# Patient Record
Sex: Male | Born: 1935 | Race: White | Hispanic: No | Smoking: Never smoker
Health system: Southern US, Community
[De-identification: ages and names within clinical notes are randomized; demographics above are authoritative.]

## PROBLEM LIST (undated history)

## (undated) DIAGNOSIS — I214 Non-ST elevation (NSTEMI) myocardial infarction: Secondary | ICD-10-CM

## (undated) DIAGNOSIS — E039 Hypothyroidism, unspecified: Secondary | ICD-10-CM

## (undated) DIAGNOSIS — R011 Cardiac murmur, unspecified: Secondary | ICD-10-CM

## (undated) DIAGNOSIS — I10 Essential (primary) hypertension: Secondary | ICD-10-CM

## (undated) DIAGNOSIS — C61 Malignant neoplasm of prostate: Secondary | ICD-10-CM

## (undated) DIAGNOSIS — M1990 Unspecified osteoarthritis, unspecified site: Secondary | ICD-10-CM

## (undated) DIAGNOSIS — D469 Myelodysplastic syndrome, unspecified: Secondary | ICD-10-CM

## (undated) HISTORY — DX: Non-ST elevation (NSTEMI) myocardial infarction: I21.4

## (undated) HISTORY — DX: Myelodysplastic syndrome, unspecified: D46.9

## (undated) HISTORY — DX: Hypothyroidism, unspecified: E03.9

## (undated) HISTORY — DX: Essential (primary) hypertension: I10

## (undated) HISTORY — DX: Cardiac murmur, unspecified: R01.1

## (undated) HISTORY — DX: Unspecified osteoarthritis, unspecified site: M19.90

## (undated) HISTORY — DX: Malignant neoplasm of prostate: C61

## (undated) HISTORY — PX: APPENDECTOMY: SHX54

---

## 1997-08-26 ENCOUNTER — Other Ambulatory Visit: Admission: RE | Admit: 1997-08-26 | Discharge: 1997-08-26 | Payer: Self-pay | Admitting: Urology

## 1997-09-09 ENCOUNTER — Ambulatory Visit (HOSPITAL_COMMUNITY): Admission: RE | Admit: 1997-09-09 | Discharge: 1997-09-09 | Payer: Self-pay | Admitting: Urology

## 1997-09-15 ENCOUNTER — Encounter: Admission: RE | Admit: 1997-09-15 | Discharge: 1997-12-14 | Payer: Self-pay | Admitting: *Deleted

## 1998-02-23 ENCOUNTER — Ambulatory Visit (HOSPITAL_COMMUNITY): Admission: RE | Admit: 1998-02-23 | Discharge: 1998-02-23 | Payer: Self-pay | Admitting: *Deleted

## 1998-04-10 ENCOUNTER — Encounter: Admission: RE | Admit: 1998-04-10 | Discharge: 1998-07-09 | Payer: Self-pay | Admitting: *Deleted

## 1998-06-02 ENCOUNTER — Emergency Department (HOSPITAL_COMMUNITY): Admission: EM | Admit: 1998-06-02 | Discharge: 1998-06-02 | Payer: Self-pay | Admitting: Emergency Medicine

## 1998-06-12 ENCOUNTER — Ambulatory Visit (HOSPITAL_COMMUNITY): Admission: RE | Admit: 1998-06-12 | Discharge: 1998-06-12 | Payer: Self-pay | Admitting: *Deleted

## 1998-06-22 ENCOUNTER — Ambulatory Visit (HOSPITAL_COMMUNITY): Admission: RE | Admit: 1998-06-22 | Discharge: 1998-06-22 | Payer: Self-pay | Admitting: *Deleted

## 1998-07-07 ENCOUNTER — Ambulatory Visit (HOSPITAL_COMMUNITY): Admission: RE | Admit: 1998-07-07 | Discharge: 1998-07-07 | Payer: Self-pay | Admitting: *Deleted

## 1998-12-19 ENCOUNTER — Ambulatory Visit (HOSPITAL_COMMUNITY): Admission: RE | Admit: 1998-12-19 | Discharge: 1998-12-19 | Payer: Self-pay | Admitting: *Deleted

## 1999-01-12 ENCOUNTER — Ambulatory Visit: Admission: RE | Admit: 1999-01-12 | Discharge: 1999-01-12 | Payer: Self-pay | Admitting: Pulmonary Disease

## 1999-03-07 ENCOUNTER — Ambulatory Visit (HOSPITAL_COMMUNITY): Admission: RE | Admit: 1999-03-07 | Discharge: 1999-03-07 | Payer: Self-pay | Admitting: Pulmonary Disease

## 1999-03-07 ENCOUNTER — Encounter: Payer: Self-pay | Admitting: Pulmonary Disease

## 1999-05-29 ENCOUNTER — Encounter: Admission: RE | Admit: 1999-05-29 | Discharge: 1999-08-27 | Payer: Self-pay | Admitting: Internal Medicine

## 1999-11-06 ENCOUNTER — Ambulatory Visit (HOSPITAL_COMMUNITY): Admission: RE | Admit: 1999-11-06 | Discharge: 1999-11-06 | Payer: Self-pay | Admitting: *Deleted

## 2001-04-10 ENCOUNTER — Encounter: Payer: Self-pay | Admitting: *Deleted

## 2001-04-10 ENCOUNTER — Emergency Department (HOSPITAL_COMMUNITY): Admission: EM | Admit: 2001-04-10 | Discharge: 2001-04-10 | Payer: Self-pay | Admitting: Emergency Medicine

## 2001-10-30 ENCOUNTER — Encounter: Payer: Self-pay | Admitting: Internal Medicine

## 2001-10-30 ENCOUNTER — Ambulatory Visit (HOSPITAL_COMMUNITY): Admission: RE | Admit: 2001-10-30 | Discharge: 2001-10-30 | Payer: Self-pay | Admitting: Internal Medicine

## 2001-11-25 ENCOUNTER — Encounter: Payer: Self-pay | Admitting: Emergency Medicine

## 2001-11-25 ENCOUNTER — Emergency Department (HOSPITAL_COMMUNITY): Admission: EM | Admit: 2001-11-25 | Discharge: 2001-11-25 | Payer: Self-pay | Admitting: Emergency Medicine

## 2003-04-09 HISTORY — PX: TOTAL KNEE ARTHROPLASTY: SHX125

## 2004-04-17 ENCOUNTER — Ambulatory Visit: Payer: Self-pay | Admitting: Internal Medicine

## 2004-07-25 ENCOUNTER — Ambulatory Visit: Payer: Self-pay | Admitting: Internal Medicine

## 2005-01-03 ENCOUNTER — Ambulatory Visit: Payer: Self-pay | Admitting: Physical Medicine & Rehabilitation

## 2005-01-03 ENCOUNTER — Inpatient Hospital Stay (HOSPITAL_COMMUNITY): Admission: RE | Admit: 2005-01-03 | Discharge: 2005-01-08 | Payer: Self-pay | Admitting: Orthopaedic Surgery

## 2005-11-25 ENCOUNTER — Ambulatory Visit: Payer: Self-pay | Admitting: Hematology & Oncology

## 2005-11-25 LAB — CBC & DIFF AND RETIC
Basophils Absolute: 0 10*3/uL (ref 0.0–0.1)
Eosinophils Absolute: 0.1 10*3/uL (ref 0.0–0.5)
HCT: 36.3 % — ABNORMAL LOW (ref 38.7–49.9)
HGB: 13 g/dL (ref 13.0–17.1)
IRF: 0.31 (ref 0.070–0.380)
MONO#: 0.2 10*3/uL (ref 0.1–0.9)
NEUT#: 1.6 10*3/uL (ref 1.5–6.5)
NEUT%: 50.5 % (ref 40.0–75.0)
RDW: 13.2 % (ref 11.2–14.6)
RETIC #: 46.6 10*3/uL (ref 31.8–103.9)
lymph#: 1.3 10*3/uL (ref 0.9–3.3)

## 2005-11-25 LAB — CHCC SMEAR

## 2005-11-28 LAB — COMPREHENSIVE METABOLIC PANEL
ALT: 12 U/L (ref 0–40)
AST: 14 U/L (ref 0–37)
Albumin: 4.2 g/dL (ref 3.5–5.2)
Alkaline Phosphatase: 73 U/L (ref 39–117)
BUN: 16 mg/dL (ref 6–23)
Chloride: 104 mEq/L (ref 96–112)
Potassium: 4.2 mEq/L (ref 3.5–5.3)
Sodium: 139 mEq/L (ref 135–145)
Total Protein: 6.5 g/dL (ref 6.0–8.3)

## 2005-11-28 LAB — ERYTHROPOIETIN: Erythropoietin: 25.2 m[IU]/mL (ref 2.6–34.0)

## 2005-11-28 LAB — PROTEIN ELECTROPHORESIS, SERUM
Albumin ELP: 62.8 % (ref 55.8–66.1)
Alpha-1-Globulin: 4.2 % (ref 2.9–4.9)
Alpha-2-Globulin: 9.8 % (ref 7.1–11.8)
Beta 2: 3.8 % (ref 3.2–6.5)
Beta Globulin: 6.3 % (ref 4.7–7.2)
Total Protein, Serum Electrophoresis: 6.5 g/dL (ref 6.0–8.3)

## 2005-11-28 LAB — TRANSFERRIN RECEPTOR, SOLUABLE: Transferrin Receptor, Soluble: 2.3 mg/L (ref 2.2–5.0)

## 2006-12-08 DIAGNOSIS — D469 Myelodysplastic syndrome, unspecified: Secondary | ICD-10-CM

## 2006-12-08 HISTORY — DX: Myelodysplastic syndrome, unspecified: D46.9

## 2006-12-26 ENCOUNTER — Emergency Department (HOSPITAL_COMMUNITY): Admission: EM | Admit: 2006-12-26 | Discharge: 2006-12-26 | Payer: Self-pay | Admitting: Emergency Medicine

## 2006-12-30 ENCOUNTER — Ambulatory Visit: Payer: Self-pay | Admitting: Hematology & Oncology

## 2006-12-30 ENCOUNTER — Inpatient Hospital Stay (HOSPITAL_COMMUNITY): Admission: AD | Admit: 2006-12-30 | Discharge: 2007-01-02 | Payer: Self-pay | Admitting: Urology

## 2007-01-01 ENCOUNTER — Encounter: Payer: Self-pay | Admitting: Hematology & Oncology

## 2007-01-16 ENCOUNTER — Ambulatory Visit: Payer: Self-pay | Admitting: Hematology & Oncology

## 2007-01-20 LAB — CBC WITH DIFFERENTIAL/PLATELET
BASO%: 0.4 % (ref 0.0–2.0)
Basophils Absolute: 0 10*3/uL (ref 0.0–0.1)
EOS%: 0 % (ref 0.0–7.0)
HCT: 33.3 % — ABNORMAL LOW (ref 38.7–49.9)
HGB: 11.9 g/dL — ABNORMAL LOW (ref 13.0–17.1)
MCH: 31.2 pg (ref 28.0–33.4)
MONO#: 0.3 10*3/uL (ref 0.1–0.9)
NEUT#: 1.2 10*3/uL — ABNORMAL LOW (ref 1.5–6.5)
RDW: 13.9 % (ref 11.2–14.6)
WBC: 2.5 10*3/uL — ABNORMAL LOW (ref 4.0–10.0)
lymph#: 1.1 10*3/uL (ref 0.9–3.3)

## 2007-01-20 LAB — CHCC SMEAR

## 2007-02-18 LAB — CBC & DIFF AND RETIC
Eosinophils Absolute: 0.1 10*3/uL (ref 0.0–0.5)
HCT: 33.8 % — ABNORMAL LOW (ref 38.7–49.9)
HGB: 12.2 g/dL — ABNORMAL LOW (ref 13.0–17.1)
IRF: 0.35 (ref 0.070–0.380)
LYMPH%: 36.2 % (ref 14.0–48.0)
MONO#: 0.2 10*3/uL (ref 0.1–0.9)
NEUT#: 1.5 10*3/uL (ref 1.5–6.5)
NEUT%: 51.9 % (ref 40.0–75.0)
Platelets: ADEQUATE 10*3/uL (ref 145–400)
WBC: 2.9 10*3/uL — ABNORMAL LOW (ref 4.0–10.0)
lymph#: 1 10*3/uL (ref 0.9–3.3)

## 2007-02-18 LAB — FERRITIN: Ferritin: 30 ng/mL (ref 22–322)

## 2007-03-31 ENCOUNTER — Ambulatory Visit: Payer: Self-pay | Admitting: Hematology & Oncology

## 2007-04-06 LAB — MANUAL DIFFERENTIAL
ANC (CHCC manual diff): 1.8 10*3/uL (ref 1.5–6.5)
Blasts: 0 % (ref 0–0)
EOS: 3 % (ref 0–7)
Myelocytes: 0 % (ref 0–0)
Other Cell: 0 % (ref 0–0)
PROMYELO: 0 % (ref 0–0)
SEG: 53 % (ref 38–77)

## 2007-04-06 LAB — CBC WITH DIFFERENTIAL/PLATELET
HCT: 36.8 % — ABNORMAL LOW (ref 38.7–49.9)
HGB: 13.1 g/dL (ref 13.0–17.1)
MCH: 30.5 pg (ref 28.0–33.4)
RDW: 14 % (ref 11.2–14.6)
WBC: 3.3 10*3/uL — ABNORMAL LOW (ref 4.0–10.0)

## 2007-04-06 LAB — CHCC SMEAR

## 2007-07-29 ENCOUNTER — Ambulatory Visit: Payer: Self-pay | Admitting: Hematology & Oncology

## 2007-08-03 LAB — CBC & DIFF AND RETIC
BASO%: 0.7 % (ref 0.0–2.0)
Basophils Absolute: 0 10*3/uL (ref 0.0–0.1)
HCT: 32.1 % — ABNORMAL LOW (ref 38.7–49.9)
HGB: 11.6 g/dL — ABNORMAL LOW (ref 13.0–17.1)
IRF: 0.34 (ref 0.070–0.380)
LYMPH%: 29.2 % (ref 14.0–48.0)
MCH: 30.8 pg (ref 28.0–33.4)
MCHC: 36 g/dL — ABNORMAL HIGH (ref 32.0–35.9)
MONO#: 0.3 10*3/uL (ref 0.1–0.9)
NEUT%: 59.1 % (ref 40.0–75.0)
Platelets: ADEQUATE 10*3/uL (ref 145–400)
lymph#: 1.2 10*3/uL (ref 0.9–3.3)

## 2007-08-03 LAB — CHCC SMEAR

## 2007-08-03 LAB — FERRITIN: Ferritin: 25 ng/mL (ref 22–322)

## 2007-09-12 ENCOUNTER — Encounter: Admission: RE | Admit: 2007-09-12 | Discharge: 2007-09-12 | Payer: Self-pay | Admitting: Orthopaedic Surgery

## 2008-02-05 ENCOUNTER — Ambulatory Visit: Payer: Self-pay | Admitting: Hematology & Oncology

## 2008-03-14 ENCOUNTER — Ambulatory Visit: Payer: Self-pay | Admitting: Oncology

## 2008-03-14 LAB — CBC & DIFF AND RETIC
Basophils Absolute: 0 10*3/uL (ref 0.0–0.1)
EOS%: 3.2 % (ref 0.0–7.0)
Eosinophils Absolute: 0.1 10*3/uL (ref 0.0–0.5)
HCT: 31.7 % — ABNORMAL LOW (ref 38.7–49.9)
HGB: 11.4 g/dL — ABNORMAL LOW (ref 13.0–17.1)
LYMPH%: 33.6 % (ref 14.0–48.0)
MCH: 30.2 pg (ref 28.0–33.4)
MCV: 83.8 fL (ref 81.6–98.0)
MONO%: 7.6 % (ref 0.0–13.0)
NEUT#: 2 10*3/uL (ref 1.5–6.5)
NEUT%: 54.5 % (ref 40.0–75.0)
Platelets: ADEQUATE 10*3/uL (ref 145–400)
RETIC #: 85.3 10*3/uL (ref 31.8–103.9)

## 2008-03-14 LAB — FERRITIN: Ferritin: 15 ng/mL — ABNORMAL LOW (ref 22–322)

## 2008-05-20 ENCOUNTER — Encounter: Admission: RE | Admit: 2008-05-20 | Discharge: 2008-05-20 | Payer: Self-pay | Admitting: Internal Medicine

## 2008-09-12 ENCOUNTER — Ambulatory Visit: Payer: Self-pay | Admitting: Oncology

## 2008-09-14 LAB — COMPREHENSIVE METABOLIC PANEL
AST: 13 U/L (ref 0–37)
Albumin: 4.1 g/dL (ref 3.5–5.2)
Alkaline Phosphatase: 68 U/L (ref 39–117)
BUN: 14 mg/dL (ref 6–23)
Creatinine, Ser: 0.92 mg/dL (ref 0.40–1.50)
Glucose, Bld: 101 mg/dL — ABNORMAL HIGH (ref 70–99)
Potassium: 4.7 mEq/L (ref 3.5–5.3)
Total Bilirubin: 0.3 mg/dL (ref 0.3–1.2)

## 2008-09-14 LAB — CBC WITH DIFFERENTIAL/PLATELET
Basophils Absolute: 0 10*3/uL (ref 0.0–0.1)
EOS%: 2.1 % (ref 0.0–7.0)
HCT: 33.2 % — ABNORMAL LOW (ref 38.4–49.9)
HGB: 11.6 g/dL — ABNORMAL LOW (ref 13.0–17.1)
MCH: 29.7 pg (ref 27.2–33.4)
MONO#: 0.3 10*3/uL (ref 0.1–0.9)
NEUT#: 1.3 10*3/uL — ABNORMAL LOW (ref 1.5–6.5)
RDW: 13.4 % (ref 11.0–14.6)
WBC: 3.4 10*3/uL — ABNORMAL LOW (ref 4.0–10.3)
lymph#: 1.7 10*3/uL (ref 0.9–3.3)

## 2008-09-14 LAB — CHCC SMEAR

## 2008-12-07 IMAGING — CT CT ABDOMEN W/ CM
1 of 4 series · 13 of 32 positions shown, 19 images · IV contrast (omnipaque)
Comparison: none

CLINICAL DATA: Constipation, abdominal pain, prostate CA.
 ABDOMEN CT WITH CONTRAST ? 12/26/06:
TECHNIQUE: Multidetector CT imaging of the abdomen was performed following the standard protocol during bolus administration of intravenous contrast. 
 Contrast:  100 cc Omnipaque 300 IV and oral Gastrografin.
TECHNIQUE: Multidetector CT imaging of the pelvis was performed following the standard protocol during bolus administration of intravenous contrast.

[Series 2: abd_pel 5.0 b40f st · axial · 0.76mm/px · z∈[-401,+14]mm · 13 of 97 slices shown, 19 images]
[im 7/97  soft-tissue]
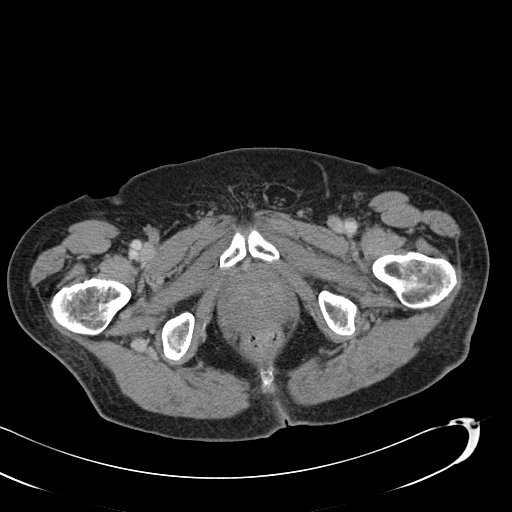
[im 7/97  bone]
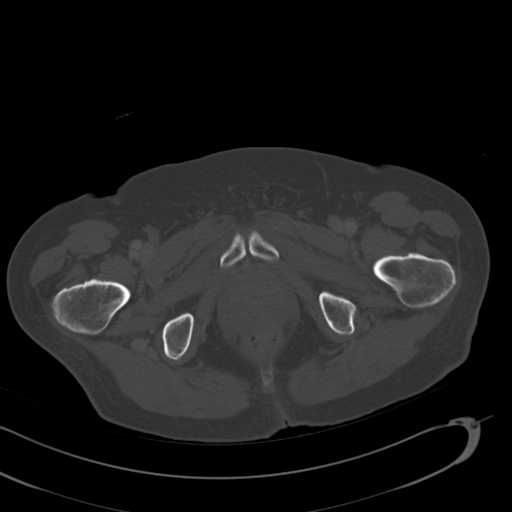
[im 13/97  soft-tissue]
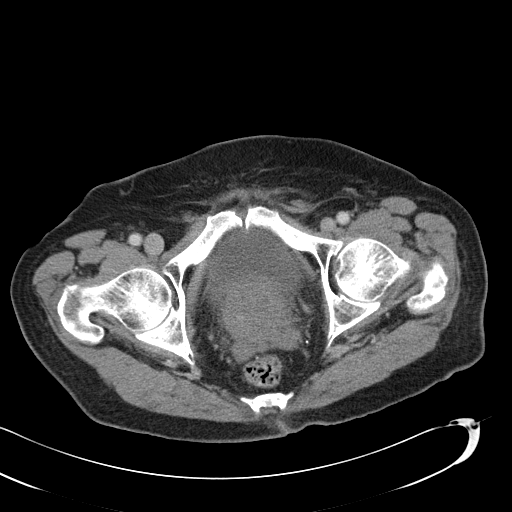
[im 20/97  soft-tissue]
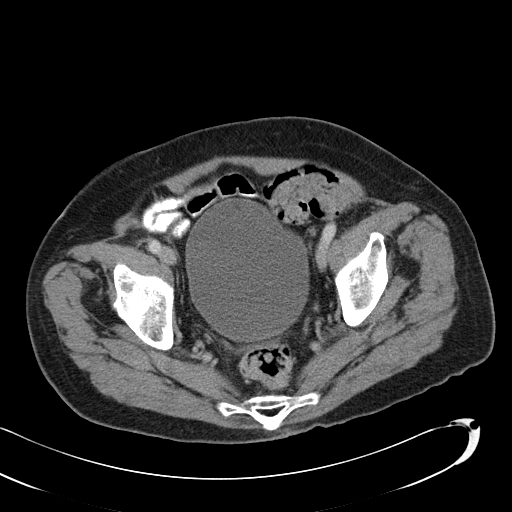
[im 26/97  soft-tissue]
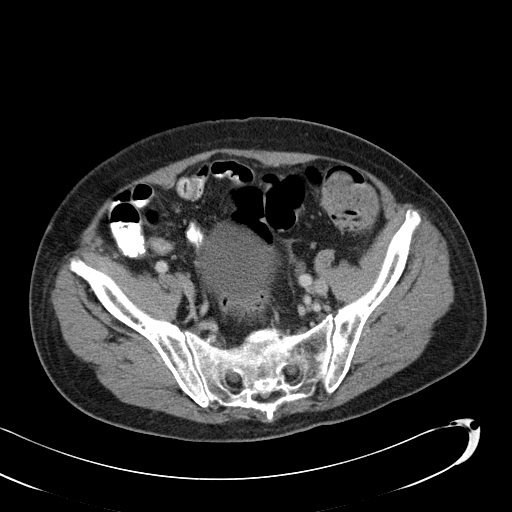
[im 33/97  soft-tissue]
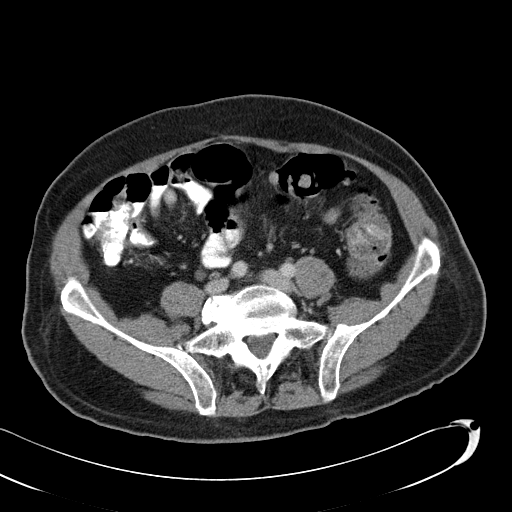
[im 39/97  soft-tissue]
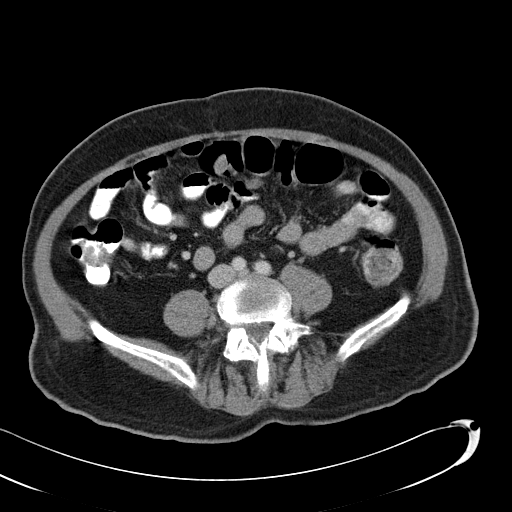
[im 52/97  soft-tissue]
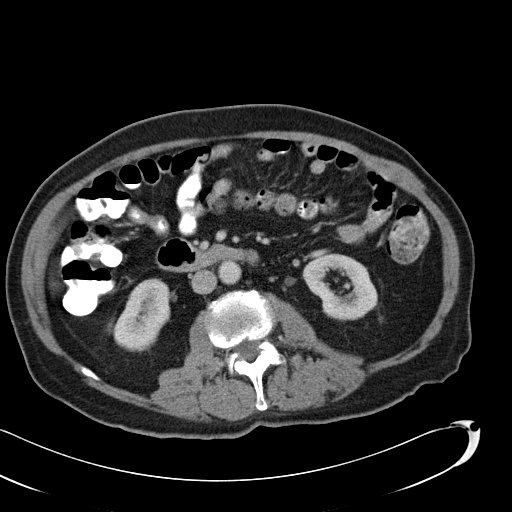
[im 58/97  soft-tissue]
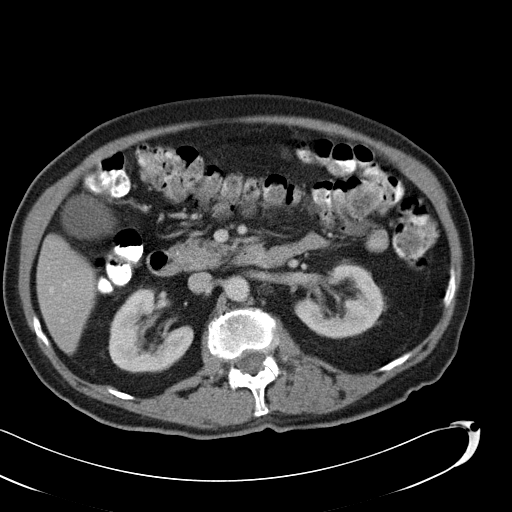
[im 65/97  soft-tissue]
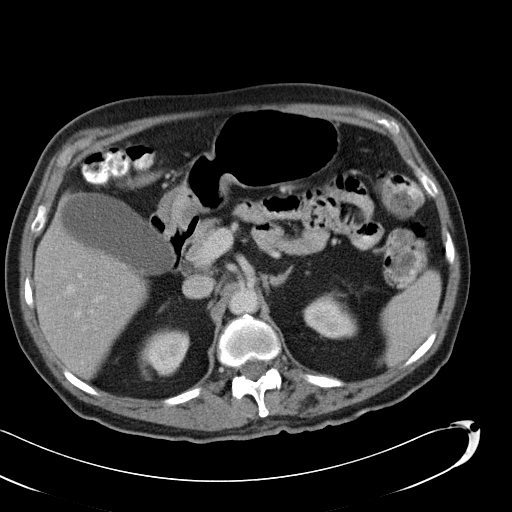
[im 65/97  bone]
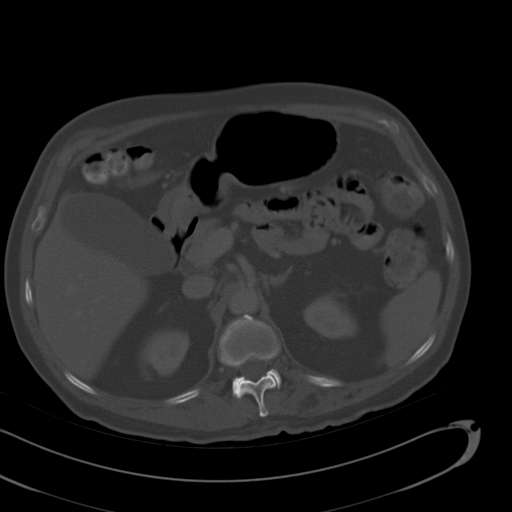
[im 71/97  soft-tissue]
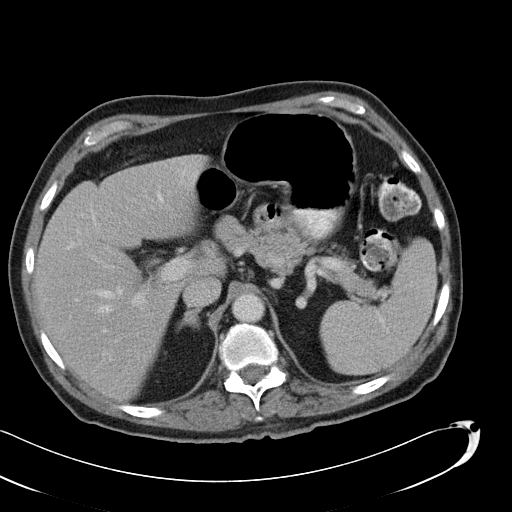
[im 71/97  lung]
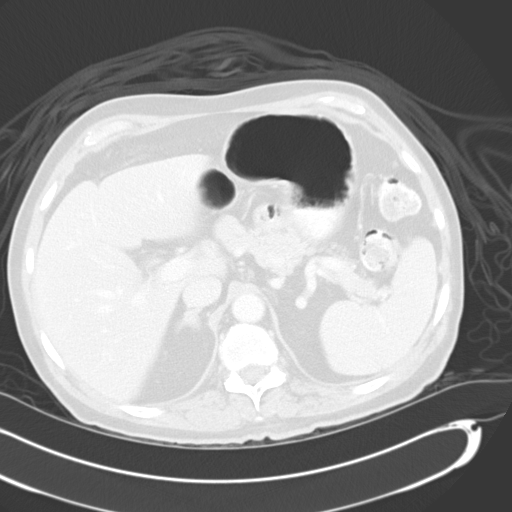
[im 77/97  soft-tissue]
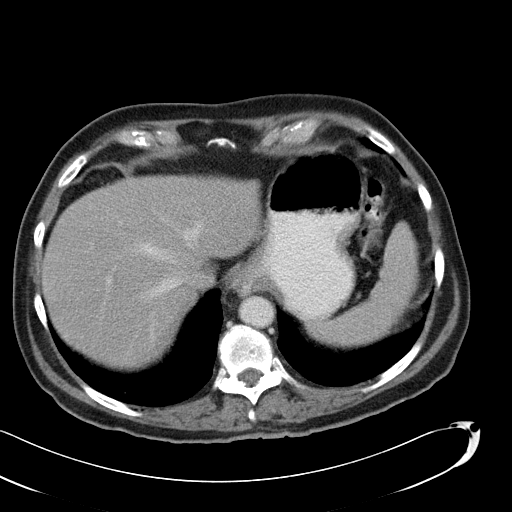
[im 77/97  lung]
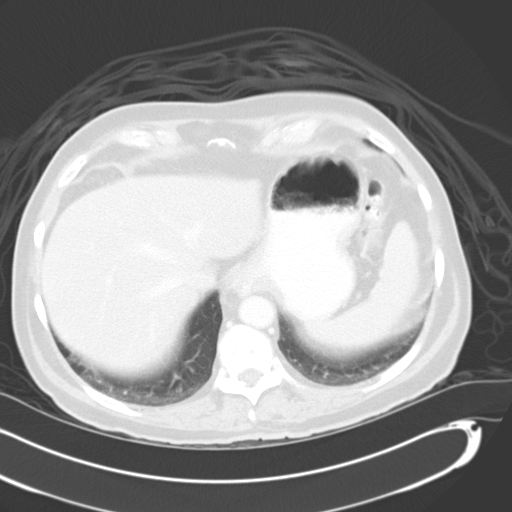
[im 84/97  soft-tissue]
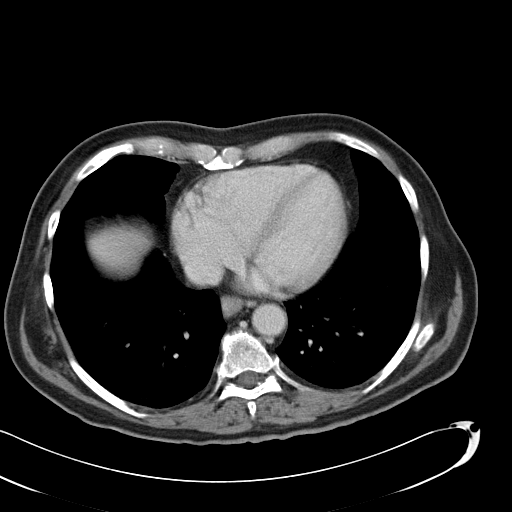
[im 84/97  lung]
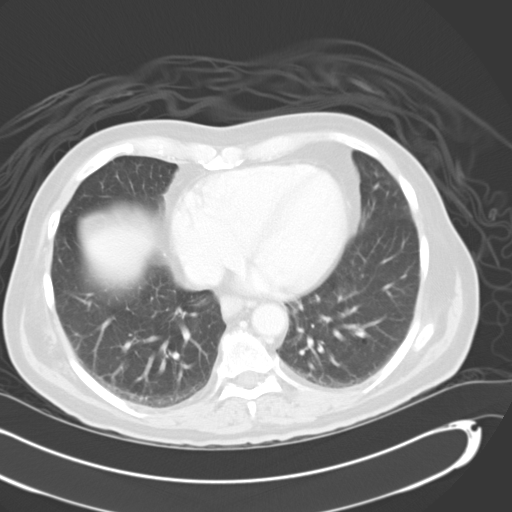
[im 90/97  soft-tissue]
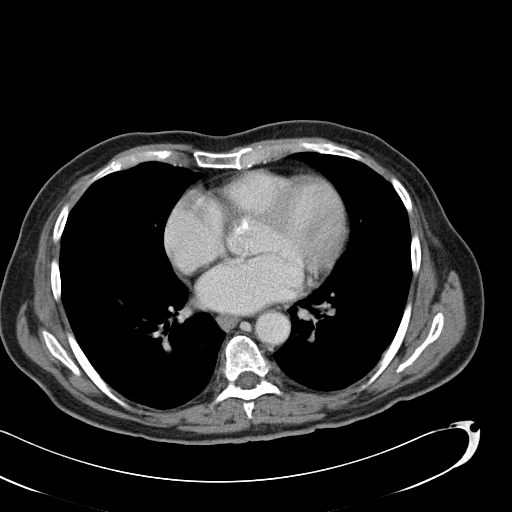
[im 90/97  lung]
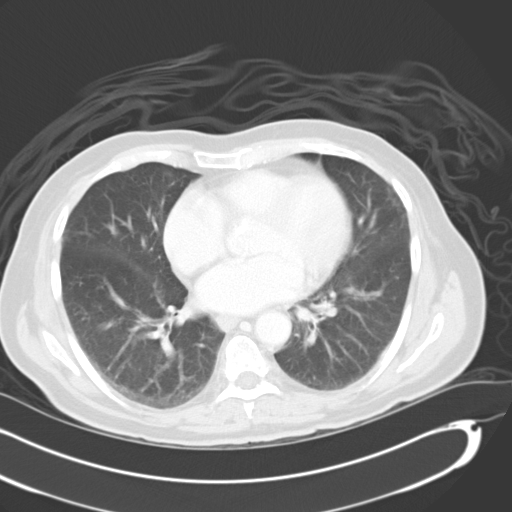

[13 of 32 positions shown; findings below may reference images not displayed]

FINDINGS: The gallbladder is distended and contains multiple small stones.  No free pericholecystic fluid.  No biliary ductal dilatation.  Normal pancreas, spleen, kidneys, and adrenal glands.  No free fluid.  No unusual bowel wall thickening.
IMPRESSION: Cholelithiasis.  
 PELVIS CT WITH CONTRAST ? 12/26/06:
FINDINGS: There is marked prostate gland enlargement.  The prostate measures 6.3 cm in maximal transverse dimension.  The seminal vesicles are unremarkable.  The bladder is unremarkable.  The pelvic sidewall is well defined.
IMPRESSION: Enlarged prostate gland.

## 2009-03-20 ENCOUNTER — Ambulatory Visit: Payer: Self-pay | Admitting: Oncology

## 2009-03-22 LAB — COMPREHENSIVE METABOLIC PANEL
ALT: 9 U/L (ref 0–53)
AST: 11 U/L (ref 0–37)
Albumin: 4.3 g/dL (ref 3.5–5.2)
Alkaline Phosphatase: 81 U/L (ref 39–117)
BUN: 18 mg/dL (ref 6–23)
CO2: 26 mEq/L (ref 19–32)
Calcium: 9 mg/dL (ref 8.4–10.5)
Chloride: 105 mEq/L (ref 96–112)
Creatinine, Ser: 1.07 mg/dL (ref 0.40–1.50)
Glucose, Bld: 193 mg/dL — ABNORMAL HIGH (ref 70–99)
Potassium: 4 mEq/L (ref 3.5–5.3)
Sodium: 140 mEq/L (ref 135–145)
Total Bilirubin: 0.3 mg/dL (ref 0.3–1.2)
Total Protein: 6.5 g/dL (ref 6.0–8.3)

## 2009-03-22 LAB — CBC WITH DIFFERENTIAL/PLATELET
Basophils Absolute: 0 10*3/uL (ref 0.0–0.1)
EOS%: 2.3 % (ref 0.0–7.0)
Eosinophils Absolute: 0.1 10*3/uL (ref 0.0–0.5)
HGB: 13.2 g/dL (ref 13.0–17.1)
LYMPH%: 42 % (ref 14.0–49.0)
MCH: 31.7 pg (ref 27.2–33.4)
MCV: 88.1 fL (ref 79.3–98.0)
MONO%: 8.8 % (ref 0.0–14.0)
NEUT#: 1.6 10*3/uL (ref 1.5–6.5)
NEUT%: 46.5 % (ref 39.0–75.0)
Platelets: 168 10*3/uL (ref 140–400)
RDW: 13.9 % (ref 11.0–14.6)

## 2009-03-22 LAB — CHCC SMEAR

## 2009-07-14 ENCOUNTER — Ambulatory Visit: Payer: Self-pay | Admitting: Oncology

## 2009-07-19 LAB — CBC WITH DIFFERENTIAL/PLATELET
BASO%: 0.3 % (ref 0.0–2.0)
Basophils Absolute: 0 10*3/uL (ref 0.0–0.1)
EOS%: 1.7 % (ref 0.0–7.0)
HCT: 32.9 % — ABNORMAL LOW (ref 38.4–49.9)
LYMPH%: 53.3 % — ABNORMAL HIGH (ref 14.0–49.0)
MCH: 29.6 pg (ref 27.2–33.4)
MCV: 84.8 fL (ref 79.3–98.0)
NEUT%: 35.7 % — ABNORMAL LOW (ref 39.0–75.0)
RBC: 3.88 10*6/uL — ABNORMAL LOW (ref 4.20–5.82)
RDW: 14.1 % (ref 11.0–14.6)
WBC: 2.9 10*3/uL — ABNORMAL LOW (ref 4.0–10.3)
nRBC: 0 % (ref 0–0)

## 2009-07-19 LAB — TECHNOLOGIST REVIEW

## 2009-09-25 ENCOUNTER — Encounter: Admission: RE | Admit: 2009-09-25 | Discharge: 2009-09-25 | Payer: Self-pay | Admitting: Internal Medicine

## 2009-10-18 ENCOUNTER — Inpatient Hospital Stay (HOSPITAL_COMMUNITY): Admission: EM | Admit: 2009-10-18 | Discharge: 2009-10-20 | Payer: Self-pay | Admitting: Emergency Medicine

## 2009-11-13 ENCOUNTER — Ambulatory Visit: Payer: Self-pay | Admitting: Oncology

## 2009-11-14 ENCOUNTER — Encounter: Admission: RE | Admit: 2009-11-14 | Discharge: 2009-11-14 | Payer: Self-pay | Admitting: Internal Medicine

## 2009-11-15 LAB — CBC WITH DIFFERENTIAL/PLATELET
EOS%: 2.9 % (ref 0.0–7.0)
HCT: 33 % — ABNORMAL LOW (ref 38.4–49.9)
MCH: 30.2 pg (ref 27.2–33.4)
MONO#: 0.2 10*3/uL (ref 0.1–0.9)
MONO%: 6.8 % (ref 0.0–14.0)
NEUT%: 47.9 % (ref 39.0–75.0)
RBC: 3.81 10*6/uL — ABNORMAL LOW (ref 4.20–5.82)
lymph#: 1.3 10*3/uL (ref 0.9–3.3)
nRBC: 0 % (ref 0–0)

## 2009-11-15 LAB — MORPHOLOGY

## 2009-11-15 LAB — CHCC SMEAR

## 2010-03-19 ENCOUNTER — Ambulatory Visit: Payer: Self-pay | Admitting: Oncology

## 2010-03-21 LAB — MORPHOLOGY

## 2010-03-21 LAB — COMPREHENSIVE METABOLIC PANEL
AST: 15 U/L (ref 0–37)
Alkaline Phosphatase: 70 U/L (ref 39–117)
BUN: 17 mg/dL (ref 6–23)
Chloride: 107 mEq/L (ref 96–112)

## 2010-03-21 LAB — CBC WITH DIFFERENTIAL/PLATELET
BASO%: 0.1 % (ref 0.0–2.0)
EOS%: 2.6 % (ref 0.0–7.0)
Eosinophils Absolute: 0.1 10*3/uL (ref 0.0–0.5)
MCH: 31.2 pg (ref 27.2–33.4)
MCV: 88.7 fL (ref 79.3–98.0)
NEUT#: 1.2 10*3/uL — ABNORMAL LOW (ref 1.5–6.5)
Platelets: 177 10*3/uL (ref 140–400)
RBC: 3.67 10*6/uL — ABNORMAL LOW (ref 4.20–5.82)
WBC: 2.7 10*3/uL — ABNORMAL LOW (ref 4.0–10.3)

## 2010-06-23 LAB — BASIC METABOLIC PANEL
BUN: 14 mg/dL (ref 6–23)
CO2: 27 mEq/L (ref 19–32)
Calcium: 8.1 mg/dL — ABNORMAL LOW (ref 8.4–10.5)
Chloride: 105 mEq/L (ref 96–112)
Creatinine, Ser: 0.81 mg/dL (ref 0.4–1.5)
GFR calc non Af Amer: >60 mL/min (ref >60)
Glucose, Bld: 123 mg/dL — ABNORMAL HIGH (ref 70–99)
Potassium: 3.8 mEq/L (ref 3.5–5.1)
Sodium: 135 mEq/L (ref 135–145)

## 2010-06-23 LAB — GLUCOSE, CAPILLARY

## 2010-06-23 LAB — CBC
Hemoglobin: 10.9 g/dL — ABNORMAL LOW (ref 13.0–17.0)
MCH: 31.1 pg (ref 26.0–34.0)
MCHC: 35 g/dL (ref 30.0–36.0)
MCV: 88.9 fL (ref 78.0–100.0)
Platelets: ADEQUATE 10*3/uL (ref 150–400)
WBC: 3.6 10*3/uL — ABNORMAL LOW (ref 4.0–10.5)

## 2010-06-24 LAB — CULTURE, RESPIRATORY W GRAM STAIN

## 2010-06-24 LAB — CBC
HCT: 35.3 % — ABNORMAL LOW (ref 39.0–52.0)
Hemoglobin: 11.8 g/dL — ABNORMAL LOW (ref 13.0–17.0)
MCHC: 34.9 g/dL (ref 30.0–36.0)
Platelets: 112 10*3/uL — ABNORMAL LOW (ref 150–400)
RBC: 3.83 MIL/uL — ABNORMAL LOW (ref 4.22–5.81)
RBC: 3.98 MIL/uL — ABNORMAL LOW (ref 4.22–5.81)
RDW: 14.1 % (ref 11.5–15.5)

## 2010-06-24 LAB — CULTURE, BLOOD (ROUTINE X 2)

## 2010-06-24 LAB — COMPREHENSIVE METABOLIC PANEL
ALT: 12 U/L (ref 0–53)
AST: 17 U/L (ref 0–37)
Albumin: 3.2 g/dL — ABNORMAL LOW (ref 3.5–5.2)
CO2: 22 mEq/L (ref 19–32)
Calcium: 8.2 mg/dL — ABNORMAL LOW (ref 8.4–10.5)
GFR calc Af Amer: >60 mL/min (ref >60)
GFR calc non Af Amer: >60 mL/min (ref >60)
Sodium: 132 mEq/L — ABNORMAL LOW (ref 135–145)
Total Protein: 6.2 g/dL (ref 6.0–8.3)

## 2010-06-24 LAB — DIFFERENTIAL
Basophils Relative: 0 % (ref 0–1)
Eosinophils Absolute: 0.1 10*3/uL (ref 0.0–0.7)
Eosinophils Relative: 1 % (ref 0–5)
Eosinophils Relative: 1 % (ref 0–5)
Lymphs Abs: 0.9 10*3/uL (ref 0.7–4.0)
Monocytes Absolute: 0.5 10*3/uL (ref 0.1–1.0)
Monocytes Relative: 7 % (ref 3–12)
Neutro Abs: 4.9 10*3/uL (ref 1.7–7.7)

## 2010-06-24 LAB — MAGNESIUM: Magnesium: 1.9 mg/dL (ref 1.5–2.5)

## 2010-06-24 LAB — HEMOGLOBIN A1C
Hgb A1c MFr Bld: 6.3 % — ABNORMAL HIGH (ref <5.7)
Mean Plasma Glucose: 134 mg/dL — ABNORMAL HIGH (ref <117)

## 2010-06-24 LAB — BASIC METABOLIC PANEL
BUN: 14 mg/dL (ref 6–23)
CO2: 24 mEq/L (ref 19–32)
Calcium: 8.7 mg/dL (ref 8.4–10.5)
Chloride: 102 mEq/L (ref 96–112)
GFR calc non Af Amer: >60 mL/min (ref >60)
Glucose, Bld: 136 mg/dL — ABNORMAL HIGH (ref 70–99)

## 2010-06-24 LAB — URINALYSIS, ROUTINE W REFLEX MICROSCOPIC
Glucose, UA: NEGATIVE mg/dL
Nitrite: NEGATIVE
Protein, ur: NEGATIVE mg/dL

## 2010-06-24 LAB — GLUCOSE, CAPILLARY
Glucose-Capillary: 130 mg/dL — ABNORMAL HIGH (ref 70–99)
Glucose-Capillary: 141 mg/dL — ABNORMAL HIGH (ref 70–99)
Glucose-Capillary: 181 mg/dL — ABNORMAL HIGH (ref 70–99)

## 2010-06-24 LAB — VITAMIN B12: Vitamin B-12: 463 pg/mL (ref 211–911)

## 2010-06-24 LAB — FERRITIN: Ferritin: 96 ng/mL (ref 22–322)

## 2010-06-24 LAB — FOLATE: Folate: >20 ng/mL

## 2010-06-24 LAB — IRON AND TIBC: UIBC: 260 ug/dL

## 2010-08-21 NOTE — H&P (Signed)
[name], [name] [name] NO.:  [id number]   MEDICAL RECORD NO.:  [id number]          PATIENT TYPE:  INP   LOCATION:  1342                         FACILITY:  [facility]   PHYSICIAN:  Maretta Bees. Vonita Moss, M.D.DATE OF BIRTH:  Oct 22, 1917   DATE OF ADMISSION:  12/30/2006  DATE OF DISCHARGE:                              HISTORY & PHYSICAL   HISTORY:  This 77 year old white male was seen by me in the office today  with complaints of poor appetite, nausea, vomiting, lethargy, fatigue,  fever to 101 over the weekend and subsequently a temperature of 103.6  here in my office today.  He was also having frequency and urgent  urination.  Apparently he went to the emergency room 3-4 days ago and  the urine culture grew out 80,000 colonies of enterococcus resistant to  Levaquin but sensitive to ampicillin.  He saw Dr. Ralene Ok yesterday  who gave him a prescription for ampicillin but the patient never got it  filled and saw me today.  I have seen him in the past for radiation  therapy for prostatic carcinoma in 2000 and his PSA was stable in July  at 0.70.  He was admitted because he was looking acutely ill and not  tolerating p.o. fluids well and I am concerned about his age and  temperature of 103.6.   PAST MEDICAL HISTORY:  Hypothyroidism, diabetes and acid reflux.   MEDICATIONS:  1. Levothyroxine 125 mcg daily.  2. Diltiazem XR 120 mg daily.  3. Metformin 500 mg daily.  4. Januvia once daily.  5. Prevacid 30 mg daily.   ALLERGIES:  CIPRO, LANOXIN and MAXZIDE.   He has had a total knee replacement.   FAMILY HISTORY:  Noncontributory.   He is retired and does not smoke.  He does not drink alcohol.   REVIEW OF SYSTEMS:  As noted above does include fever and fatigue. He  has had no rashes or itching.  He has no blurred vision.  He has no  headache or sore throat. He has no cough or chest pain. He does have  nausea and vomiting and poor appetite. He has had no blood in  stools or  headaches.   Blood pressure is 105/60 and pulse is 80 and temperature is down to  98.1.  He is alert and oriented.  No acute distress.  CHEST:  Clear.  HEART:  Heart tones regular.  ABDOMEN:  Soft, nontender.  No hernias.  No inguinal nodes.  No  hepatosplenomegaly. Penis, urethra, meatus, scrotum, testicles, and  epididymis without lesions.  Perineum is normal and external genitalia  is normal.  Prostate feels small, seminal vesicles not palpable.   Urine in our office was negative despite the culture.   A renal ultrasound in our office showed no hydronephrosis.  There was no  significant residual urine.   IMPRESSION:  1. Acute urinary tract infection with fever.  2. History of prostate cancer.  3. Pancytopenia on today's lab work with a hemoglobin of 10.9,      platelets of 80,000 and white count of 2100.  4. Diabetes.  5. Hypothyroidism.  6. Gastroesophageal reflux.   PLAN:  He is admitted for IV fluids and IV Unasyn and we will get a  hematology consult.   1. Acute urinary tract infection with fever.  2. History of prostate cancer.  3. Pancytopenia on today's lab work with a hemoglobin of 10.9,      platelets of 80,000 and white count of 2100.  4. Diabetes.  5. Hypothyroidism.  6. Gastroesophageal reflux.  thank you very much      Maretta Bees. Vonita Moss, M.D.  Electronically Signed     LJP/MEDQ  D:  12/30/2006  T:  12/31/2006  Job:  [id number]   cc:   Ralene Ok, M.D.  Fax: [phone]

## 2010-08-21 NOTE — Discharge Summary (Signed)
NAMEJABARIE, POP NO.:  [id number]   MEDICAL RECORD NO.:  [id number]          PATIENT TYPE:  INP   LOCATION:  1342                         FACILITY:  [facility]   PHYSICIAN:  Maretta Bees. Vonita Moss, M.D.DATE OF BIRTH:  [birth year]   DATE OF ADMISSION:  12/30/2006  DATE OF DISCHARGE:  01/02/2007                               DISCHARGE SUMMARY   FINAL DIAGNOSES:  1. Urinary tract infection.  2. Pancytopenia.  3. Diabetes mellitus, type 2.  4. Hypothyroidism.  5. Esophageal reflux.  6. History of prostatic carcinoma.  7. Nausea and vomiting.  8. Fatigue and malaise.   PROCEDURE:  Bone marrow biopsy, January 01, 2007.   HISTORY:  This 77 year old gentleman was admitted with nausea, vomiting,  lethargy and fatigue and fever to 103.6.  He was having urgent and  frequent urination.  Before admission, he was in the emergency room 3  days before and grew 80,000 colonies of Enterococcus resistant to the  Levaquin he was taking, but sensitive to ampicillin.  Because of  continued clinical deterioration, he was admitted for further therapy  including IV antibiotics and IV hydration, since he appeared to be  dehydrated.  He has a history of radiation therapy for prostate  carcinoma; his last PSA was stable at 0.7.   OTHER MEDICAL PROBLEMS:  1. Hypothyroidism.  2. Diabetes.  3. Acid reflux.   PHYSICAL EXAMINATION:  On admission, he was a white male appearing  younger than stated age, but acutely ill-appearing male and pale and  complaining of weakness.   HOSPITAL COURSE:  After admission, he was started on IV antibiotics in  the form of Unasyn.  I should add that in our office before admission, a  renal ultrasound showed no hydronephrosis.   During the course of his hospital stay, lab work demonstrated a  pancytopenia and he was seen in consultation by Dr. Arlan Organ, who  did a bone marrow biopsy that showed hypercellular marrow.  He became  afebrile, improved  clinically and was ready for discharge on January 02, 2007.   DISCHARGE INSTRUCTIONS AND FOLLOWUP:  He is to go home on diabetic diet  and activity as tolerated and he will return to the office in 1-2 weeks  in followup.  He will also return to see Dr. Myna Hidalgo in followup.   DISCHARGE MEDICATIONS:  He was sent home on leave the thyroxine,  diltiazem, metformin, Januvia, Prevacid and amoxicillin.   CONDITION ON DISCHARGE:  He was sent home in improved condition.      Maretta Bees. Vonita Moss, M.D.  Electronically Signed     LJP/MEDQ  D:  01/14/2007  T:  01/15/2007  Job:  [id number]

## 2010-08-24 NOTE — Consult Note (Signed)
NAME:  [name] [name], [name] [name] NO.:  [id number]   MEDICAL RECORD NO.:  [id number]                   PATIENT TYPE:  EMS   LOCATION:  ED                                   FACILITY:  [facility]   PHYSICIAN:  Marinus Maw, M.D.            DATE OF BIRTH:  Oct 15, 1917   DATE OF CONSULTATION:  DATE OF DISCHARGE:                          INTERNAL MEDICINE CONSULTATION   PATIENT PROFILE:  The patient is a very nice 77 year old divorced white male  with a history of hypertension, non-insulin dependent diabetes mellitus,  benign prostatic hypertrophy, carcinoma of the prostate, hypothyroidism, and  paroxysmal supraventricular tachycardia, and also a history of iron-  deficiency anemia with negative GI workup in the past.   The patient presented to the emergency room this date with complaints of  weakness and light-headedness, and in the course of evaluation, he had  negative chest x-ray, carotid Dopplers, CMET, and cardiac enzymes, but CBC  returned showing moderate iron-deficiency type anemia with hemoglobin 7.2,  and MCV of 77, and a low white count of 2700.  Platelet count was in normal  range.  The patient denied knowledge of blood, specifically denying any  evidence of blood in his bowel movements or symptoms of heartburn, although  he has been taking ibuprofen apparently on a fairly regular basis.  Further,  the patient has a history of iron-deficiency anemia in the past with  extensive workup, including negative to normal B12 folate studies in the  past with negative flexible sigmoidoscopy on three occasions, most recently  in 1999 per Dr. Dorena Cookey.  In the course of the patient's evaluation he  was found to have a trace positive  stool Hemoccult.  He further reports  exertional dyspnea with moderate activities, but denies any chest pain or  discomfort or presyncopal type episodes.   CURRENT MEDICATIONS:  1. Cartia 240 mg XT.  2. Actos 15 mg.  3. Flomax 0.4  mg q.d.  4. Synthroid 75 mcg.  5. Ibuprofen 200 mg.   ALLERGIES:  CIPRO with rash, and intolerance to LANOXIN and MAXZIDE, with  nausea and weakness.   PAST MEDICAL HISTORY:  1. Reported usual childhood illnesses, without history of rheumatic fever,     scarlet fever, or heart murmur.  2. The patient had extensive workup in the past, including negative 2-D     echocardiogram with normal ejection fraction in 2000.  3. The patient does have a history of hypertension for several years.  4. Has been treated for non-insulin dependent diabetes mellitus     intermittently since 1997.  Most recently over the last year with Actos.  5. History of benign prostatic hypertrophy, chronic prostatitis, and in     1999, was diagnosed with carcinoma of the prostate for which he underwent     a course of external beam radiation and treatment with Lupron and Casodex     by Dr. Vonita Moss.  6.  History of PAC's, PVC's, and paroxysmal atrial tachycardia, for which he     is being monitored by Dr. Donnie Aho, and recently started on Cartia.  7. Negative stress test in 1994, Dr. Riley Kill.  8. History of chronic allergic perineal rhinitis.  9. History of hyperlipidemia.  10.      Kidney stones (1995).  11.      History of mild chronic obstructive pulmonary disease, evaluated by     Dr. Danice Goltz.  12.      The patient has had CT scans of the chest showing some scarring     type densities in the right upper lobe with no other abnormalities, and     PFT's consistent with possible mild restrictive defect versus poor     effort, and no evidence of significant obstruction or response to     bronchodilator.  13.      History of recurrent and chronic lumbago.  14.      Mild degenerative joint disease and degenerative disk disease by x-     ray.   IMMUNIZATIONS:  Pneumovax and DP booster immunizations to date.   PAST SURGICAL HISTORY:  1. Remote appendectomy.  2. Flexible sigmoidoscopy.  3. In 1995, prostate  biopsy negative.  4. Right rotator cuff arthroscopic surgery in 1998.  5. In 1999, flexible sigmoidoscopy negative, Dr. Dorena Cookey.  6. In 5/99, prostate biopsy positive for adenocarcinoma per Dr. Vonita Moss,     followed by external beam radiation.   FAMILY HISTORY:  Father died at age 57 with carcinoma of the prostate.  Mother age 15 with diabetes, and three brothers and one sister reported  alive and well.  One son undergoing chemotherapy with carcinoma of the lung  metastatic to liver.   SOCIAL HISTORY:  Divorced and living alone.  No history of tobacco or  alcohol use.  Right-handed.  Retired.   REVIEW OF SYMPTOMS:  No complaints of headache, paresthesias, visual  complaints, diplopia, blind spots, flashes, floaters, hearing difficulty or  tinnitus, dysarthria, dysphagia, cough, sputum production or hemoptysis, but  does have complaints of exertional dyspnea as above.  No exertional type  chest pain, palpitations, orthopnea, PND, claudication, or dependent edema.  Denies heartburn or reflux symptoms, does report some occasional lower  abdominal cramping and diarrhea, and occasional constipation.  Denies  hematemesis, melena, or hematochezia.  Reports nocturia 1-3x, occasional  hesitancy and urgency, but no incontinence or dysuria.  No significant  musculoskeletal or neuro symptoms.   PHYSICAL EXAMINATION:  VITAL SIGNS:  Blood pressure 140/78, pulse 86 and  regular, respiratory rate not labored, afebrile.  SKIN:  Warm and dry without rash, lesions, cyanosis, icterus, petechiae,  ecchymosis, or clubbing evident.  HEENT:  EAC patent, TM's normal, VF full to gross confrontation.  EOM full  conjugate.  Pupillary light responses normal.  Mild lenticular clouding  bilateral.  Funduscopic reveals disks flat, normal cupping, AV ratio without  nicking, hemorrhages, or exudates seen.  Nasal and oropharynx clear and  moist without lesions noted. NECK:  Supple, carotids 2+ normal upstrokes,  no bruit, JVD, adenopathy, or  thyromegaly is noted.  CHEST:  Mild increased AP diameter, no wheezes, rhonchi, or rales noted,  breath sounds transmitted easily.  HEART:  No rubs or thrills.  Heart sounds are soft, distant, and regular  without appreciable murmurs, gallops, clicks, or rubs noted.  ABDOMEN:  Soft without palpable masses or organomegaly, tenderness noted.  Bowel sounds are normal.  RECTAL:  No hernia evident.  Rectal shows normal tone with prostate smooth,  firm, without nodularity.  Stool is light brown and solid, and tests trace  Hemoccult positive.  GENITALIA:  Normal.  MUSCULOSKELETAL:  With gentle range of motion, full threat, no deformity.  NEUROLOGIC:  Sensory, motor, cerebellar functions normal symmetric, mental  status is normal.   IMPRESSION:  1. Iron-deficiency anemia, consistent with gastrointestinal blood loss,     presumed chronic.  2. Hypertension by history.  3. Non-insulin dependent diabetes mellitus.  4. Arteriosclerotic heart disease, HXP, supraventricular tachycardia.  5. History of carcinoma of the prostate.   PLAN:  As the patient seems stable in the setting of presumed chronic low  grade GI blood loss, we will transfuse with IV Lasix to avoid fluid overload  and anticipate discharging from the emergency room with close follow up in  the office.  The patient is advised upon return home to return to the office  in the morning for recheck on his CBC, and anticipation of further  diagnostic evaluations, to include referral back to his gastroenterologist,  Dr. Dorena Cookey.                                                Marinus Maw, M.D.    WDM/MEDQ  D:  11/25/2001  T:  11/25/2001  Job:  [id number]   cc:   Barrie Folk, M.D.   Maretta Bees. Vonita Moss, M.D.   Darden Palmer., M.D.   Lovenia Kim, D.O.

## 2010-08-24 NOTE — Discharge Summary (Signed)
[name] [name], [name] [name] NO.:  [id number]   MEDICAL RECORD NO.:  [id number]          PATIENT TYPE:  INP   LOCATION:  5011                         FACILITY:  [facility]   PHYSICIAN:  [name] Basque. [name], M.D.DATE OF BIRTH:  Feb 10, 1918   DATE OF ADMISSION:  01/03/2005  DATE OF DISCHARGE:  01/08/2005                                 DISCHARGE SUMMARY   ADMISSION DIAGNOSES:  1.  End-stage degenerative joint disease, left knee.  2.  History of non-insulin-dependent diabetes mellitus.  3.  History of hypertension.  4.  History of hypothyroidism.   DISCHARGE DIAGNOSES:  1.  End-stage degenerative joint disease, left knee.  2.  History of non-insulin-dependent diabetes mellitus.  3.  History of hypertension.  4.  History of hypothyroidism.  5.  Mild blood loss anemia.   HISTORY OF PRESENT ILLNESS:  [name] [name] is an 77 year old white male patient  well-known to our practice who is having severe left knee discomfort.  We  have treated him for quite some time conservatively with corticosteroid  injections and nonsteroidal anti-inflammatory drugs.  He is now having pain  with every step and nighttime pain.  His x-rays reveal end-stage DJD of his  left knee.  His is admitted for a planned TKR.   PERTINENT LABORATORY AND X-RAY DATA:  Chest x-ray:  There is a density in  the retrocardiac left lower lobe which is difficult to confirm on one view  and suggested repeated chest x-ray.  EKG:  Sinus rhythm with first-degree AV  block.   Last protime INR was 2.3.  WBC 4.9, hemoglobin 10.8, hematocrit 31,  platelets 125.  Sodium 135, potassium 3.9, BUN 11, glucose 175, calcium 8.3.   HOSPITAL COURSE:  The patient was admitted postoperatively.  He was placed  on a sliding scale of insulin for nonpregnant adults, PCA standing orders  for a morphine low-dose protocol, lactated Ringer's IV, Ancef 1 g q.8h. x3  doses, Colace, Senokot, iron pill, Coumadin and Lovenox for DVT prophylaxis  through the pharmacy.  He was kept on his home medications which were Taztia  XT 180 mg daily, diltiazem ER 121 daily, glyburide 5 mg daily, Synthroid 75  mcg daily, Senokot, and a stool softener.  Ice to his knee, Foley catheter  was also used.  He had a knee immobilizer on while ambulating, to be  discontinued the second or third day postoperatively.  Knee-high TEDs.  Incentive spirometry q.1h.  CPM machine was initially 0 to 50 degrees and  advanced as tolerated.  Followup lab studies were done, as described above.   The first day postoperatively, his blood pressure was 152/58, temperature  100, CBG 166, hemoglobin 11.0, INR 1.2.  Lungs were clear.  Abdomen was  soft.  Left Hemovac was functioning.  Foley catheter was also, and his  dressing was dry with no sign of infection or irritation.  Physical therapy  was ordered.  He could be weightbearing as tolerated.  Up and out of bed.   On subsequent followup visits, his temperature did max out to 101.  His  Foley catheter was discontinued.  His CPM machine was increased.  Pharmacy  was regulating his Coumadin and Lovenox for an INR between 2 and 3.  [facility] was the agency that was contacted upon discharge,  providing home physical therapy and blood draws for protimes.   On January 07, 2005, we had initially wanted to send him home, but the  equipment for home care by [facility] was not delivered.  His  discharge was delayed one more day until January 08, 2005.  On January 08, 2005, his blood pressure was 123/64, temperature 99, INR was 1.8.  His wound  was benign with no sign of irritation or infection.  He had normal  neurovascular status and was discharged home.   DISCHARGE MEDICATIONS:  His medicines were reconciled with his home  medications and hospital sheet.  He was also added on Percocet 5/325, one or  two q.4-6h. for pain.  Coumadin per pharmacy which initially was 2.5 mg one  tablet daily and an INR to be  drawn on Wednesday.   DIET:  No restrictions.   ACTIVITY:  He was up and walking with crutches or walker.  Weightbearing as  tolerated.   DISCHARGE INSTRUCTIONS:  May change the dressing daily.  [facility],  once again, to provide home care services, including physical therapy and  protimes.  He is to call [phone] for any sign of infection and for an  appointment in one week.      [name] [name], P.A.      [name] Basque [name] [name], M.D.  Electronically Signed    MC/MEDQ  D:  01/08/2005  T:  01/08/2005  Job:  [id number]

## 2010-08-24 NOTE — Op Note (Signed)
[name] [name], [name] [name] NO.:  [id number]   MEDICAL RECORD NO.:  [id number]          PATIENT TYPE:  INP   LOCATION:  2860                         FACILITY:  [facility]   PHYSICIAN:  Lubertha Basque. Dalldorf, M.D.DATE OF BIRTH:  July 28, 1917   DATE OF PROCEDURE:  01/03/2005  DATE OF DISCHARGE:                                 OPERATIVE REPORT   PREOPERATIVE DIAGNOSIS:  Left knee degenerative arthritis.   POSTOPERATIVE DIAGNOSIS:  Left knee degenerative arthritis.   PROCEDURE:  Left total knee replacement.   ANESTHESIA:  General.   ATTENDING SURGEON:  Lubertha Basque. Jerl Santos, M.D.   ASSISTANT:  Lindwood Qua, P.A.   INDICATION FOR PROCEDURE:  The patient is an 77 year old male with a long  history of left knee pain.  This has persisted despite oral anti-  inflammatories and injections of visco-supplementative agents as well as  cortisone.  He has pain which limits his ability to rest and remain active.  He has bone-on-bone contact on x-rays and is offered a knee replacement  operation.  Informed operative was obtained after discussion of possible  complications of reaction to anesthesia, infection, DVT, PE, and death.   DESCRIPTION OF PROCEDURE:  The patient was taken to an operating suite where  a general anesthetic was applied without difficulty.  He was positioned  supine and prepped and draped in normal sterile fashion.  After the  administration of preop IV antibiotic, the left leg was elevated,  exsanguinated, and tourniquet inflated about the thigh.  A longitudinal  anterior incision was made with dissection down to the extensor mechanism.  All appropriate anti-infective measures were used including preoperative IV  antibiotics, Betadine-impregnated drape, and closed-hooded exhaust systems  for each member of the surgical team.  A medial parapatellar incision was  made and the knee cap flipped and the knee flexed.  He did have a moderate  varus deformity and an extensive  medial release was performed off the tibia.  An extramedullary guide was placed on the tibia in order to create a  proximal cut with a slight posterior tilt.  An intramedullary guide was then  placed on the femur in order to make the anterior and posterior cuts,  creating a flexion gap of 10 mm.  A second intramedullary guide was placed  on the femur in order to make a distal cut, creating an equal extension gap  of 10 mm, balancing the knee.  The femur sized to a large while the tibia  sized to a 5 and the appropriate guides were placed and utilized.  The  patella was cut down to a thickness from 25 to 15 mm and sized to a 38 with  the appropriate guide placed and utilized.  A trial reduction was done with  these components and the knee easily came to full extension with some slight  hyperextension.  The knee cap tracked well through flexion and no lateral  release was required.  Trial components were removed followed by pulsatile  lavage irrigation of all 3 cut bony surface.  Cement was mixed including  antibiotic and was pressurized onto all  3 bones.  The aforementioned DePuy  LCS components were then placed which were a large femur, a 5 tibia, a 10  spacer, and 38-mm all-polyethylene patella.  Excess cement was trimmed and  pressure was held on the components until the cement had hardened.  The  tourniquet was deflated and a small amount of bleeding was easily controlled  with Bovie cautery.  The wound was irrigated, followed by placement of a  drain exiting superolaterally.  The extensor mechanism was reapproximated  with #1 Vicryl in interrupted fashion, followed by subcutaneous  reapproximation with Vicryl and skin closure with staples.  Adaptic was  placed on the wound, followed by dry gauze and a loose Ace wrap.  Estimated  blood loss and intraoperative fluids can be obtained from the anesthesia  records, as can an accurate tourniquet time.   DISPOSITION:  The patient was  extubated in the operating room and taken to  the recovery room in stable condition.  Plans were for him to be admitted to  the Orthopedic Surgery Service for appropriate postop care to include  perioperative antibiotic and Coumadin plus Lovenox for DVT prophylaxis.      Lubertha Basque Jerl Santos, M.D.  Electronically Signed     PGD/MEDQ  D:  01/03/2005  T:  01/03/2005  Job:  [id number]

## 2010-11-21 ENCOUNTER — Encounter (HOSPITAL_BASED_OUTPATIENT_CLINIC_OR_DEPARTMENT_OTHER): Payer: Medicare Other | Admitting: Oncology

## 2010-11-21 ENCOUNTER — Other Ambulatory Visit: Payer: Self-pay | Admitting: Medical

## 2010-11-21 DIAGNOSIS — D649 Anemia, unspecified: Secondary | ICD-10-CM

## 2010-11-21 LAB — CBC WITH DIFFERENTIAL/PLATELET
Basophils Absolute: 0 10*3/uL (ref 0.0–0.1)
EOS%: 1.6 % (ref 0.0–7.0)
Eosinophils Absolute: 0 10*3/uL (ref 0.0–0.5)
HCT: 30.3 % — ABNORMAL LOW (ref 38.4–49.9)
HGB: 10.9 g/dL — ABNORMAL LOW (ref 13.0–17.1)
MCH: 30.9 pg (ref 27.2–33.4)
MCV: 86.1 fL (ref 79.3–98.0)
MONO%: 8.8 % (ref 0.0–14.0)
NEUT#: 1.4 10*3/uL — ABNORMAL LOW (ref 1.5–6.5)
NEUT%: 48.3 % (ref 39.0–75.0)
RDW: 13.9 % (ref 11.0–14.6)
lymph#: 1.2 10*3/uL (ref 0.9–3.3)

## 2011-01-17 LAB — COMPREHENSIVE METABOLIC PANEL
ALT: 18
ALT: 20
AST: 29
Albumin: 3.3 — ABNORMAL LOW
Albumin: 3.8
Alkaline Phosphatase: 67
Chloride: 101
Chloride: 99
Creatinine, Ser: 1.1
GFR calc Af Amer: >60
Potassium: 3.8
Potassium: 4
Sodium: 132 — ABNORMAL LOW
Sodium: 134 — ABNORMAL LOW
Total Bilirubin: 0.6
Total Bilirubin: 0.9
Total Protein: 6.3

## 2011-01-17 LAB — BASIC METABOLIC PANEL
BUN: 11
BUN: 7
Chloride: 102
Chloride: 106
Creatinine, Ser: 0.87
GFR calc non Af Amer: >60
Glucose, Bld: 203 — ABNORMAL HIGH
Potassium: 4.4

## 2011-01-17 LAB — CBC
HCT: 28.2 — ABNORMAL LOW
HCT: 34.5 — ABNORMAL LOW
Hemoglobin: 12.2 — ABNORMAL LOW
MCHC: 35.5
MCV: 86
MCV: 87.4
MCV: 87.6
Platelets: 63 — ABNORMAL LOW
Platelets: 66 — ABNORMAL LOW
Platelets: 79 — ABNORMAL LOW
Platelets: 80 — ABNORMAL LOW
Platelets: ADEQUATE
RDW: 13.4
RDW: 13.4
RDW: 13.5
WBC: 2.1 — ABNORMAL LOW
WBC: 3.1 — ABNORMAL LOW
WBC: 3.7 — ABNORMAL LOW

## 2011-01-17 LAB — URINE CULTURE: Colony Count: 80000

## 2011-01-17 LAB — URINALYSIS, ROUTINE W REFLEX MICROSCOPIC
Bilirubin Urine: NEGATIVE
Glucose, UA: NEGATIVE
Nitrite: NEGATIVE
Specific Gravity, Urine: 1.023
pH: 6

## 2011-01-17 LAB — CULTURE, BLOOD (ROUTINE X 2): Culture: NO GROWTH

## 2011-01-17 LAB — DIFFERENTIAL
Basophils Absolute: 0
Basophils Absolute: 0
Basophils Absolute: 0
Basophils Relative: 0
Eosinophils Absolute: 0
Eosinophils Absolute: 0
Eosinophils Relative: 0
Lymphs Abs: 0.3 — ABNORMAL LOW
Lymphs Abs: 0.7
Lymphs Abs: 1.4
Lymphs Abs: 1.4
Monocytes Absolute: 0.2
Monocytes Absolute: 0.4
Monocytes Relative: 18 — ABNORMAL HIGH
Neutro Abs: 1 — ABNORMAL LOW
Neutro Abs: 3.2
Neutrophils Relative %: 40 — ABNORMAL LOW

## 2011-01-17 LAB — URINE MICROSCOPIC-ADD ON

## 2011-01-17 LAB — RETICULOCYTES: Retic Ct Pct: 0.5

## 2011-01-17 LAB — ERYTHROPOIETIN: Erythropoietin: 22

## 2011-01-17 LAB — CHROMOSOME ANALYSIS, BONE MARROW

## 2011-03-18 ENCOUNTER — Encounter: Payer: Self-pay | Admitting: *Deleted

## 2011-03-27 ENCOUNTER — Telehealth: Payer: Self-pay | Admitting: Oncology

## 2011-03-27 NOTE — Telephone Encounter (Signed)
Pt came in for appt on the wrong day. Per [facility] pt will have to r/s. Pt informed and per pt he does not wish to r/s he just has too many appts - is this ok? Per HH if pt declines appt can be cx and not r/s'd. Pt declines.

## 2011-04-03 ENCOUNTER — Other Ambulatory Visit: Payer: Medicare Other | Admitting: Lab

## 2011-04-03 ENCOUNTER — Ambulatory Visit: Payer: Medicare Other | Admitting: Oncology

## 2011-04-10 LAB — LIPID PANEL
Cholesterol: 146 mg/dL (ref 0–200)
LDL Cholesterol: 81 mg/dL
Triglycerides: 110 mg/dL (ref 40–160)

## 2011-04-10 LAB — HEPATIC FUNCTION PANEL
ALT: 11 U/L (ref 10–40)
AST: 17 U/L (ref 14–40)
Alkaline Phosphatase: 81 U/L (ref 25–125)
Bilirubin, Total: 0.6 mg/dL

## 2011-04-10 LAB — HEMOGLOBIN A1C: Hgb A1c MFr Bld: 6.1 % — AB (ref 4.0–6.0)

## 2011-04-10 LAB — CBC AND DIFFERENTIAL
HCT: 34 % — AB (ref 41–53)
Hemoglobin: 11.4 g/dL — AB (ref 13.5–17.5)
Platelets: 152 10*3/uL (ref 150–399)

## 2011-09-17 ENCOUNTER — Other Ambulatory Visit (INDEPENDENT_AMBULATORY_CARE_PROVIDER_SITE_OTHER): Payer: Medicare Other

## 2011-09-17 ENCOUNTER — Encounter: Payer: Self-pay | Admitting: Internal Medicine

## 2011-09-17 ENCOUNTER — Ambulatory Visit (INDEPENDENT_AMBULATORY_CARE_PROVIDER_SITE_OTHER): Payer: Medicare Other | Admitting: Internal Medicine

## 2011-09-17 VITALS — BP 132/60 | HR 69 | Temp 97.7°F | Ht 69.0 in | Wt 157.6 lb

## 2011-09-17 DIAGNOSIS — E039 Hypothyroidism, unspecified: Secondary | ICD-10-CM | POA: Insufficient documentation

## 2011-09-17 DIAGNOSIS — E119 Type 2 diabetes mellitus without complications: Secondary | ICD-10-CM

## 2011-09-17 DIAGNOSIS — I10 Essential (primary) hypertension: Secondary | ICD-10-CM

## 2011-09-17 DIAGNOSIS — E1149 Type 2 diabetes mellitus with other diabetic neurological complication: Secondary | ICD-10-CM | POA: Insufficient documentation

## 2011-09-17 DIAGNOSIS — D469 Myelodysplastic syndrome, unspecified: Secondary | ICD-10-CM

## 2011-09-17 DIAGNOSIS — M1990 Unspecified osteoarthritis, unspecified site: Secondary | ICD-10-CM | POA: Insufficient documentation

## 2011-09-17 DIAGNOSIS — C61 Malignant neoplasm of prostate: Secondary | ICD-10-CM | POA: Insufficient documentation

## 2011-09-17 DIAGNOSIS — R5383 Other fatigue: Secondary | ICD-10-CM

## 2011-09-17 LAB — CBC WITH DIFFERENTIAL/PLATELET
Basophils Relative: 0.5 % (ref 0.0–3.0)
HCT: 36.1 % — ABNORMAL LOW (ref 39.0–52.0)
Hemoglobin: 12.1 g/dL — ABNORMAL LOW (ref 13.0–17.0)
Lymphocytes Relative: 44.2 % (ref 12.0–46.0)
MCHC: 33.5 g/dL (ref 30.0–36.0)
Monocytes Relative: 11.2 % (ref 3.0–12.0)
Neutro Abs: 1.5 10*3/uL (ref 1.4–7.7)
RBC: 4.02 Mil/uL — ABNORMAL LOW (ref 4.22–5.81)

## 2011-09-17 LAB — LIPID PANEL
HDL: 45.7 mg/dL (ref >39.00)
Total CHOL/HDL Ratio: 3
VLDL: 21 mg/dL (ref 0.0–40.0)

## 2011-09-17 LAB — HEPATIC FUNCTION PANEL: Total Bilirubin: 0.5 mg/dL (ref 0.3–1.2)

## 2011-09-17 LAB — TSH: TSH: 1.91 u[IU]/mL (ref 0.35–5.50)

## 2011-09-17 LAB — HEMOGLOBIN A1C: Hgb A1c MFr Bld: 6.2 % (ref 4.6–6.5)

## 2011-09-17 LAB — BASIC METABOLIC PANEL
CO2: 26 mEq/L (ref 19–32)
Calcium: 9 mg/dL (ref 8.4–10.5)
Potassium: 4.1 mEq/L (ref 3.5–5.1)
Sodium: 141 mEq/L (ref 135–145)

## 2011-09-17 NOTE — Progress Notes (Signed)
Subjective:    Patient ID: [name] [name], male    DOB: Apr 17, 1917, 78 y.o.   MRN: [id number]  HPI  New pt to me, here to establish care Reviewed chronic medical issues:  DM2 - on metformin solo tx - the patient reports compliance with medication(s) as prescribed. Denies adverse side effects. denies hypoglycemia - checks cbgs 1x/d  HTN - on dilt - the patient reports compliance with medication(s) as prescribed. Denies adverse side effects. No edema or chest pain   Hypothyroid - the patient reports compliance with medication(s) as prescribed. Denies adverse side effects. No weight changes, bowel changes  Past Medical History  Diagnosis Date  . Arthritis   . Diabetes mellitus   . Heart murmur   . Hypertension   . Hypothyroid   . Prostate cancer    Family History  Problem Relation Age of Onset  . Prostate cancer Father    History  Substance Use Topics  . Smoking status: Never Smoker   . Smokeless tobacco: Not on file  . Alcohol Use: No     Review of Systems Constitutional: Negative for fever or weight change. Positive for fatigue and "stress" - recent family deaths and fear of aging Respiratory: Negative for cough and shortness of breath.   Cardiovascular: Negative for chest pain or palpitations.  Gastrointestinal: Negative for abdominal pain, no bowel changes.  Musculoskeletal: Negative for gait problem or joint swelling.  Skin: Negative for rash.  Neurological: Negative for dizziness or headache.  No other specific complaints in a complete review of systems (except as listed in HPI above).     Objective:   Physical Exam BP 132/60  Pulse 69  Temp(Src) 97.7 F (36.5 C) (Oral)  Ht 5\' 9"  (1.753 m)  Wt 157 lb 9.6 oz (71.487 kg)  BMI 23.27 kg/m2  SpO2 98% Wt Readings from Last 3 Encounters:  09/17/11 157 lb 9.6 oz (71.487 kg)  03/21/10 157 lb 14.4 oz (71.623 kg)   Constitutional:  He appears younger than stated age, well-developed and well-nourished. No  distress.  Dtr at side Neck: Normal range of motion. Neck supple. No JVD present. No thyromegaly present.  Cardiovascular: Normal rate, regular rhythm and normal heart sounds.  2/6 syst murmur heard. no BLE edema Pulmonary/Chest: Effort normal and breath sounds normal. No respiratory distress. no wheezes.  Abdominal: Soft. Bowel sounds are normal. Patient exhibits no distension. There is no tenderness.  Musculoskeletal: arthritic changes. S/p L TKR -Normal range of motion. Patient exhibits no gross deformity.  Neurological: he is alert and oriented to person, place, and time. No cranial nerve deficit. Coordination normal.  Skin: Skin is warm and dry.  No erythema or ulceration.  Psychiatric: he has a normal mood and affect. behavior is normal. Judgment and thought content normal.   Lab Results  Component Value Date   WBC 2.9* 11/21/2010   HGB 10.9* 11/21/2010   HCT 30.3* 11/21/2010   PLT Clumped Platelets--Appears Adequate 11/21/2010   GLUCOSE 122* 03/21/2010   ALT 9 03/21/2010   AST 15 03/21/2010   NA 143 03/21/2010   K 4.0 03/21/2010   CL 107 03/21/2010   CREATININE 0.88 03/21/2010   BUN 17 03/21/2010   CO2 28 03/21/2010   HGBA1C  Value: 6.3 10/19/2009        Assessment & Plan:  See problem list. Medications and labs reviewed today. Fatigue - check screening labs - exam and hx nonspecific - may be emotional - depression or anxiety but exclude other  medical problems with screening labs first  Time spent with pt/family today 45 minutes, greater than 50% time spent counseling patient on diabetes, MDS hx and medication review. Also review of prior records

## 2011-09-17 NOTE — Assessment & Plan Note (Signed)
Not on statin or ACEI rec ASA 81 qd Metformin solotx - Check a1c and lipids now - adjust tx as needed but will balance risk/benefit of tx with adv age Lab Results  Component Value Date   HGBA1C  Value: 6.3 (NOTE)                                                                       According to the ADA Clinical Practice Recommendations for 2011, when HbA1c is used as a screening test:   >=6.5%   Diagnostic of Diabetes Mellitus           (if abnormal result  is confirmed)  5.7-6.4%   Increased risk of developing Diabetes Mellitus  References:Diagnosis and Classification of Diabetes Mellitus,Diabetes Care,2011,34(Suppl 1):S62-S69 and Standards of Medical Care in         Diabetes - 2011,Diabetes Care,2011,34  (Suppl 1):S11-S61.* 10/19/2009

## 2011-09-17 NOTE — Assessment & Plan Note (Signed)
The current medical regimen is effective;  continue present plan and medications. BP Readings from Last 3 Encounters:  09/17/11 132/60  03/21/10 149/73

## 2011-09-17 NOTE — Assessment & Plan Note (Signed)
Progressive fatigue - ?depression, ?MDS or thyroid imbalance Send for ROI and check TSH now, adjust as needed No results found for this basename: TSH

## 2011-09-17 NOTE — Assessment & Plan Note (Signed)
Has not seen heme >12 mo Check cbc now and refer for follow up

## 2011-09-17 NOTE — Patient Instructions (Signed)
It was good to see you today. We have reviewed your prior records including labs and tests today Test(s) ordered today. Your results will be called to you after review (48-72hours after test completion). If any changes need to be made, you will be notified at that time. Medications reviewed, no changes at this time. we will send to your prior provider(s) for "release of records" as discussed today -  Please schedule followup in 3-4 months for diabetes mellitus check, call sooner if problems.

## 2011-09-24 ENCOUNTER — Encounter: Payer: Self-pay | Admitting: Internal Medicine

## 2011-10-17 ENCOUNTER — Other Ambulatory Visit: Payer: Self-pay

## 2011-10-17 MED ORDER — METFORMIN HCL 500 MG PO TABS: 500.0000 mg | ORAL_TABLET | Freq: Two times a day (BID) | ORAL | Status: DC

## 2011-10-17 MED ORDER — LEVOTHYROXINE SODIUM 125 MCG PO TABS: 125.0000 ug | ORAL_TABLET | Freq: Every day | ORAL | Status: DC

## 2011-10-17 MED ORDER — MECLIZINE HCL 12.5 MG PO TABS: 12.5000 mg | ORAL_TABLET | Freq: Every day | ORAL | Status: DC

## 2011-10-17 MED ORDER — DILTIAZEM HCL 120 MG PO TABS: 120.0000 mg | ORAL_TABLET | Freq: Every day | ORAL | Status: DC

## 2011-12-17 ENCOUNTER — Ambulatory Visit (INDEPENDENT_AMBULATORY_CARE_PROVIDER_SITE_OTHER): Payer: Medicare Other | Admitting: Internal Medicine

## 2011-12-17 ENCOUNTER — Encounter: Payer: Self-pay | Admitting: Internal Medicine

## 2011-12-17 ENCOUNTER — Encounter: Payer: Self-pay | Admitting: *Deleted

## 2011-12-17 VITALS — BP 130/82 | HR 74 | Temp 97.2°F | Ht 69.0 in | Wt 152.4 lb

## 2011-12-17 DIAGNOSIS — F419 Anxiety disorder, unspecified: Secondary | ICD-10-CM

## 2011-12-17 DIAGNOSIS — E039 Hypothyroidism, unspecified: Secondary | ICD-10-CM

## 2011-12-17 DIAGNOSIS — F341 Dysthymic disorder: Secondary | ICD-10-CM

## 2011-12-17 DIAGNOSIS — Z23 Encounter for immunization: Secondary | ICD-10-CM

## 2011-12-17 DIAGNOSIS — D469 Myelodysplastic syndrome, unspecified: Secondary | ICD-10-CM

## 2011-12-17 DIAGNOSIS — F32A Depression, unspecified: Secondary | ICD-10-CM | POA: Insufficient documentation

## 2011-12-17 DIAGNOSIS — R269 Unspecified abnormalities of gait and mobility: Secondary | ICD-10-CM

## 2011-12-17 DIAGNOSIS — E119 Type 2 diabetes mellitus without complications: Secondary | ICD-10-CM

## 2011-12-17 DIAGNOSIS — F329 Major depressive disorder, single episode, unspecified: Secondary | ICD-10-CM | POA: Insufficient documentation

## 2011-12-17 MED ORDER — PAROXETINE HCL 10 MG PO TABS: 10.0000 mg | ORAL_TABLET | ORAL | Status: DC

## 2011-12-17 NOTE — Assessment & Plan Note (Signed)
Has not seen heme >12 mo CBC    Component Value Date/Time   WBC 3.5* 09/17/2011 1037   WBC 3.1 04/10/2011   WBC 2.9* 11/21/2010 1353   RBC 4.02* 09/17/2011 1037   RBC 3.52* 11/21/2010 1353   HGB 12.1* 09/17/2011 1037   HGB 10.9* 11/21/2010 1353   HCT 36.1* 09/17/2011 1037   HCT 30.3* 11/21/2010 1353   PLT 80.0 Repeated and verified X2.* 09/17/2011 1037   PLT Clumped Platelets--Appears Adequate 11/21/2010 1353   MCV 89.7 09/17/2011 1037   MCV 86.1 11/21/2010 1353   MCH 30.9 11/21/2010 1353   MCH 31.1 10/20/2009 0505   MCHC 33.5 09/17/2011 1037   MCHC 35.9 11/21/2010 1353   RDW 13.6 09/17/2011 1037   RDW 13.9 11/21/2010 1353   LYMPHSABS 1.6 09/17/2011 1037   LYMPHSABS 1.2 11/21/2010 1353   MONOABS 0.4 09/17/2011 1037   MONOABS 0.3 11/21/2010 1353   EOSABS 0.1 09/17/2011 1037   EOSABS 0.0 11/21/2010 1353   BASOSABS 0.0 09/17/2011 1037   BASOSABS 0.0 11/21/2010 1353

## 2011-12-17 NOTE — Patient Instructions (Addendum)
It was good to see you today. Start low dose Paxil for nerves and low energy -Your prescription(s) have been submitted to your pharmacy. Please take as directed and contact our office if you believe you are having problem(s) with the medication(s). Other medications reviewed, no additional changes at this time. Please schedule followup in 3-4 months for diabetes mellitus and thyroid check, call sooner if problems. Jury excuse note given today we'll make referral to home health physical therapy for balance work. Our office will contact you regarding appointment(s) once made.

## 2011-12-17 NOTE — Assessment & Plan Note (Signed)
Monitor TSH q35mo and adjust tx as needed The current medical regimen is effective;  continue present plan and medications.   Lab Results  Component Value Date   TSH 1.91 09/17/2011

## 2011-12-17 NOTE — Assessment & Plan Note (Signed)
Not on statin or ACEI rec ASA 81 qd Metformin solotx - reviewed a1c and lipids - check q6-68mo adjust tx as needed but will balance risk/benefit of tx with adv age Lab Results  Component Value Date   HGBA1C 6.2 09/17/2011

## 2011-12-17 NOTE — Progress Notes (Signed)
  Subjective:    Patient ID: [name] [name], male    DOB: 21-Jul-1917, 78 y.o.   MRN: [id number]  HPI  Here for follow up -reviewed chronic medical issues:  DM2 - on metformin solo tx - the patient reports compliance with medication(s) as prescribed. Denies adverse side effects. denies hypoglycemia - checks cbgs 1x/d  HTN - on dilt - the patient reports compliance with medication(s) as prescribed. Denies adverse side effects. No edema or chest pain   Hypothyroid - the patient reports compliance with medication(s) as prescribed. Denies adverse side effects. No weight changes, bowel changes  Notes balance problems with falls - refuses to use walker, denies leg weakness, dizzy or syncope symptoms   Past Medical History  Diagnosis Date  . Arthritis   . Diabetes mellitus   . Heart murmur   . Hypertension   . Hypothyroid   . Prostate cancer 1998 dx  . MDS (myelodysplastic syndrome) 12/2006     yperplastic on bone marrow bx 2008    Review of Systems  Constitutional: Negative for fever or weight change. Positive for fatigue. Respiratory: Negative for cough and shortness of breath.   Cardiovascular: Negative for chest pain or palpitations.      Objective:   Physical Exam  BP 130/82  Pulse 74  Temp 97.2 F (36.2 C) (Oral)  Ht 5\' 9"  (1.753 m)  Wt 152 lb 6.4 oz (69.128 kg)  BMI 22.51 kg/m2  SpO2 99% Wt Readings from Last 3 Encounters:  12/17/11 152 lb 6.4 oz (69.128 kg)  09/17/11 157 lb 9.6 oz (71.487 kg)  03/21/10 157 lb 14.4 oz (71.623 kg)   Constitutional:  He appears younger than stated age, well-developed and well-nourished. No distress.  Dtr at side Neck: Normal range of motion. Neck supple. No JVD present. No thyromegaly present.  Cardiovascular: Normal rate, regular rhythm and normal heart sounds.  2/6 syst murmur heard. no BLE edema Pulmonary/Chest: Effort normal and breath sounds normal. No respiratory distress. no wheezes.  Musculoskeletal: arthritic changes. S/p L  TKR -Normal range of motion. Patient exhibits no gross deformity. Gait and balance appears normal today Neurological: he is alert and oriented to person, place, and time. No cranial nerve deficit. Coordination normal.  Psychiatric: he has a normal mood and affect. behavior is normal. Judgment and thought content normal.   Lab Results  Component Value Date   WBC 3.5* 09/17/2011   HGB 12.1* 09/17/2011   HCT 36.1* 09/17/2011   PLT 80.0 Repeated and verified X2.* 09/17/2011   GLUCOSE 106* 09/17/2011   CHOL 141 09/17/2011   TRIG 105.0 09/17/2011   HDL 45.70 09/17/2011   LDLCALC 74 09/17/2011   ALT 11 09/17/2011   AST 20 09/17/2011   NA 141 09/17/2011   K 4.1 09/17/2011   CL 109 09/17/2011   CREATININE 0.9 09/17/2011   BUN 20 09/17/2011   CO2 26 09/17/2011   TSH 1.91 09/17/2011   HGBA1C 6.2 09/17/2011       Assessment & Plan:  See problem list. Medications and labs reviewed today.  Fatigue, suspect multifactorial - MDS and arthritis and may be emotional -  Also depression with overlap of anxiety -  start paxil low dose today  Gait abnormality with falls - HHPT/OT refer

## 2011-12-30 ENCOUNTER — Telehealth: Payer: Self-pay | Admitting: Internal Medicine

## 2011-12-30 NOTE — Telephone Encounter (Signed)
Received Fax from [facility] in [place] to Orders for Pt. Need signature and to be faxed back. Placed in [facility] 9/23

## 2011-12-30 NOTE — Telephone Encounter (Signed)
Francisco Capuchin (Physical Therapist) with [facility] [phone] regarding patient having dizziness and head pressure.  Olegario Messier wants to know if she can continue therapy for the dizziness.  Please call Olegario Messier back.  Thanks

## 2011-12-30 NOTE — Telephone Encounter (Signed)
Yes - continue hh

## 2011-12-30 NOTE — Telephone Encounter (Signed)
ok 

## 2012-01-08 DIAGNOSIS — E119 Type 2 diabetes mellitus without complications: Secondary | ICD-10-CM

## 2012-01-08 DIAGNOSIS — I10 Essential (primary) hypertension: Secondary | ICD-10-CM

## 2012-01-08 DIAGNOSIS — E039 Hypothyroidism, unspecified: Secondary | ICD-10-CM

## 2012-01-08 DIAGNOSIS — Q068 Other specified congenital malformations of spinal cord: Secondary | ICD-10-CM

## 2012-01-08 DIAGNOSIS — R269 Unspecified abnormalities of gait and mobility: Secondary | ICD-10-CM

## 2012-01-14 ENCOUNTER — Telehealth: Payer: Self-pay | Admitting: Internal Medicine

## 2012-01-14 ENCOUNTER — Ambulatory Visit (INDEPENDENT_AMBULATORY_CARE_PROVIDER_SITE_OTHER): Payer: Medicare Other | Admitting: Internal Medicine

## 2012-01-14 ENCOUNTER — Other Ambulatory Visit (INDEPENDENT_AMBULATORY_CARE_PROVIDER_SITE_OTHER): Payer: Medicare Other

## 2012-01-14 ENCOUNTER — Encounter: Payer: Self-pay | Admitting: Internal Medicine

## 2012-01-14 ENCOUNTER — Ambulatory Visit (INDEPENDENT_AMBULATORY_CARE_PROVIDER_SITE_OTHER)
Admission: RE | Admit: 2012-01-14 | Discharge: 2012-01-14 | Disposition: A | Discharge status: Home / Self Care | Payer: Medicare Other | Source: Ambulatory Visit | Attending: Internal Medicine | Admitting: Internal Medicine

## 2012-01-14 VITALS — BP 140/70 | HR 72 | Temp 96.4°F | Resp 16 | Wt 156.0 lb

## 2012-01-14 DIAGNOSIS — R0600 Dyspnea, unspecified: Secondary | ICD-10-CM

## 2012-01-14 DIAGNOSIS — R270 Ataxia, unspecified: Secondary | ICD-10-CM | POA: Insufficient documentation

## 2012-01-14 DIAGNOSIS — E039 Hypothyroidism, unspecified: Secondary | ICD-10-CM

## 2012-01-14 DIAGNOSIS — R279 Unspecified lack of coordination: Secondary | ICD-10-CM

## 2012-01-14 DIAGNOSIS — R209 Unspecified disturbances of skin sensation: Secondary | ICD-10-CM

## 2012-01-14 DIAGNOSIS — E538 Deficiency of other specified B group vitamins: Secondary | ICD-10-CM | POA: Insufficient documentation

## 2012-01-14 DIAGNOSIS — F329 Major depressive disorder, single episode, unspecified: Secondary | ICD-10-CM

## 2012-01-14 DIAGNOSIS — R202 Paresthesia of skin: Secondary | ICD-10-CM

## 2012-01-14 DIAGNOSIS — F419 Anxiety disorder, unspecified: Secondary | ICD-10-CM

## 2012-01-14 DIAGNOSIS — R0609 Other forms of dyspnea: Secondary | ICD-10-CM

## 2012-01-14 DIAGNOSIS — F341 Dysthymic disorder: Secondary | ICD-10-CM

## 2012-01-14 DIAGNOSIS — R42 Dizziness and giddiness: Secondary | ICD-10-CM

## 2012-01-14 DIAGNOSIS — R0989 Other specified symptoms and signs involving the circulatory and respiratory systems: Secondary | ICD-10-CM

## 2012-01-14 LAB — CBC WITH DIFFERENTIAL/PLATELET
Basophils Absolute: 0 10*3/uL (ref 0.0–0.1)
Basophils Relative: 0.3 % (ref 0.0–3.0)
Eosinophils Relative: 1.6 % (ref 0.0–5.0)
HCT: 36.3 % — ABNORMAL LOW (ref 39.0–52.0)
Hemoglobin: 12.3 g/dL — ABNORMAL LOW (ref 13.0–17.0)
Lymphocytes Relative: 32.4 % (ref 12.0–46.0)
Lymphs Abs: 1.3 10*3/uL (ref 0.7–4.0)
Monocytes Relative: 8.4 % (ref 3.0–12.0)
Neutro Abs: 2.2 10*3/uL (ref 1.4–7.7)
RBC: 4.1 Mil/uL — ABNORMAL LOW (ref 4.22–5.81)
WBC: 3.9 10*3/uL — ABNORMAL LOW (ref 4.5–10.5)

## 2012-01-14 LAB — BASIC METABOLIC PANEL
Calcium: 8.9 mg/dL (ref 8.4–10.5)
GFR: 76.85 mL/min (ref >60.00)
Glucose, Bld: 138 mg/dL — ABNORMAL HIGH (ref 70–99)
Potassium: 4 mEq/L (ref 3.5–5.1)
Sodium: 139 mEq/L (ref 135–145)

## 2012-01-14 LAB — URINALYSIS
Bilirubin Urine: NEGATIVE
Hgb urine dipstick: NEGATIVE
Urine Glucose: NEGATIVE
Urobilinogen, UA: 0.2 (ref 0.0–1.0)

## 2012-01-14 LAB — TSH: TSH: 0.98 u[IU]/mL (ref 0.35–5.50)

## 2012-01-14 LAB — HEPATIC FUNCTION PANEL
ALT: 11 U/L (ref 0–53)
AST: 17 U/L (ref 0–37)
Albumin: 4 g/dL (ref 3.5–5.2)
Alkaline Phosphatase: 71 U/L (ref 39–117)
Total Protein: 7.1 g/dL (ref 6.0–8.3)

## 2012-01-14 LAB — SEDIMENTATION RATE: Sed Rate: 26 mm/hr — ABNORMAL HIGH (ref 0–22)

## 2012-01-14 MED ORDER — VITAMIN B-12 1000 MCG SL SUBL: 1.0000 | SUBLINGUAL_TABLET | Freq: Every day | SUBLINGUAL | Status: DC

## 2012-01-14 NOTE — Telephone Encounter (Signed)
[name] [name], please, inform patient that all labs and CXR are normal except for borderline low B12. Start B12 sl - Rx Thx

## 2012-01-14 NOTE — Assessment & Plan Note (Signed)
Started SL B12 

## 2012-01-14 NOTE — Assessment & Plan Note (Signed)
H/o abn CXR/CT CXR today

## 2012-01-14 NOTE — Assessment & Plan Note (Signed)
Check TSH 

## 2012-01-14 NOTE — Assessment & Plan Note (Signed)
check B12. 

## 2012-01-14 NOTE — Assessment & Plan Note (Signed)
  Head CT Labs 

## 2012-01-14 NOTE — Assessment & Plan Note (Signed)
Head CT 

## 2012-01-14 NOTE — Patient Instructions (Addendum)
Use your cane please for walking please

## 2012-01-14 NOTE — Assessment & Plan Note (Signed)
Cont w/Paxil 

## 2012-01-14 NOTE — Progress Notes (Signed)
Subjective:    Patient ID: [name] [name], male    DOB: 1917-09-14, 78 y.o.   MRN: [id number]  HPI   C/o balance problems with falls - refuses to use walker, denies leg weakness, dizzy or syncope symptoms. No HAs. C/o fatigue, anxiety. He was started on Paxil for anxiety - it did not help. He was dizzy prior to Paxil. "I can't explain how I feel: I don't have a HA but it feels heavy..." C/o chill... He is a poor historian...    Past Medical History  Diagnosis Date  . Arthritis   . Diabetes mellitus   . Heart murmur   . Hypertension   . Hypothyroid   . Prostate cancer 1998 dx  . MDS (myelodysplastic syndrome) 12/2006     yperplastic on bone marrow bx 2008    Review of Systems  Constitutional: Positive for chills and fatigue. Negative for fever and unexpected weight change.  HENT: Negative for nosebleeds, mouth sores, neck pain, voice change and ear discharge.   Eyes: Negative for pain, redness and visual disturbance.  Respiratory: Positive for shortness of breath. Negative for cough and wheezing.   Cardiovascular: Negative for leg swelling.  Gastrointestinal: Negative for abdominal pain, diarrhea, blood in stool and rectal pain.  Genitourinary: Positive for urgency and frequency. Negative for hematuria and genital sores.  Musculoskeletal: Negative for joint swelling.  Skin: Negative for pallor, rash and wound.  Neurological: Positive for dizziness, tremors, weakness and light-headedness. Negative for seizures, facial asymmetry and speech difficulty.  Psychiatric/Behavioral: Positive for decreased concentration. Negative for suicidal ideas, disturbed wake/sleep cycle and dysphoric mood. The patient is nervous/anxious.    Constitutional: Negative for fever or weight change. Positive for fatigue. Respiratory: Negative for cough and shortness of breath.   Cardiovascular: Negative for chest pain or palpitations.      Objective:   Physical Exam  Constitutional: He appears  well-developed and well-nourished.  Eyes: Right eye exhibits no discharge.  Cardiovascular: Exam reveals no gallop.   Pulmonary/Chest: He has no wheezes. He has no rales. He exhibits no tenderness.  Abdominal: He exhibits no mass.  Lymphadenopathy:    He has no cervical adenopathy.  Neurological: No cranial nerve deficit. Coordination abnormal.  Psychiatric: He has a normal mood and affect.   BP 140/70  Pulse 72  Temp 96.4 F (35.8 C) (Oral)  Resp 16  Wt 156 lb (70.761 kg) Wt Readings from Last 3 Encounters:  01/14/12 156 lb (70.761 kg)  12/17/11 152 lb 6.4 oz (69.128 kg)  09/17/11 157 lb 9.6 oz (71.487 kg)   Constitutional:  He appears younger than stated age, well-developed and well-nourished. No distress.  Dtr at side Neck: Normal range of motion. Neck supple. No JVD present. No thyromegaly present.  Cardiovascular: Normal rate, regular rhythm and normal heart sounds.  2/6 syst murmur heard. no BLE edema Pulmonary/Chest: Effort normal and breath sounds normal. No respiratory distress. no wheezes.  Musculoskeletal: arthritic changes. S/p L TKR -Normal range of motion. Patient exhibits no gross deformity. Gait and balance appears normal today Neurological: he is alert and oriented to person, place, and time. No cranial nerve deficit. Coordination normal.  Psychiatric: he has a normal mood and affect. behavior is normal. Judgment and thought content normal.  Ataxic Scalp is NT  Lab Results  Component Value Date   WBC 3.5* 09/17/2011   HGB 12.1* 09/17/2011   HCT 36.1* 09/17/2011   PLT 80.0 Repeated and verified X2.* 09/17/2011   GLUCOSE 106* 09/17/2011  CHOL 141 09/17/2011   TRIG 105.0 09/17/2011   HDL 45.70 09/17/2011   LDLCALC 74 09/17/2011   ALT 11 09/17/2011   AST 20 09/17/2011   NA 141 09/17/2011   K 4.1 09/17/2011   CL 109 09/17/2011   CREATININE 0.9 09/17/2011   BUN 20 09/17/2011   CO2 26 09/17/2011   TSH 1.91 09/17/2011   HGBA1C 6.2 09/17/2011       Assessment & Plan:    See problem list. Medications and labs reviewed today.  Fatigue, suspect multifactorial - MDS and arthritis and may be emotional -  Also depression with overlap of anxiety -  On paxil now  Gait abnormality with falls -   A complex case.Marland KitchenMarland Kitchen

## 2012-01-15 NOTE — Telephone Encounter (Signed)
Pt informed

## 2012-01-16 ENCOUNTER — Ambulatory Visit (INDEPENDENT_AMBULATORY_CARE_PROVIDER_SITE_OTHER)
Admission: RE | Admit: 2012-01-16 | Discharge: 2012-01-16 | Disposition: A | Discharge status: Home / Self Care | Payer: Medicare Other | Source: Ambulatory Visit | Attending: Internal Medicine | Admitting: Internal Medicine

## 2012-01-16 DIAGNOSIS — R42 Dizziness and giddiness: Secondary | ICD-10-CM

## 2012-01-16 DIAGNOSIS — R279 Unspecified lack of coordination: Secondary | ICD-10-CM

## 2012-01-28 ENCOUNTER — Ambulatory Visit (INDEPENDENT_AMBULATORY_CARE_PROVIDER_SITE_OTHER): Payer: Medicare Other | Admitting: Internal Medicine

## 2012-01-28 ENCOUNTER — Encounter: Payer: Self-pay | Admitting: Internal Medicine

## 2012-01-28 VITALS — BP 140/72 | HR 79 | Temp 97.5°F | Ht 69.0 in | Wt 153.0 lb

## 2012-01-28 DIAGNOSIS — F32A Depression, unspecified: Secondary | ICD-10-CM

## 2012-01-28 DIAGNOSIS — R42 Dizziness and giddiness: Secondary | ICD-10-CM

## 2012-01-28 DIAGNOSIS — F329 Major depressive disorder, single episode, unspecified: Secondary | ICD-10-CM

## 2012-01-28 DIAGNOSIS — I10 Essential (primary) hypertension: Secondary | ICD-10-CM

## 2012-01-28 DIAGNOSIS — D469 Myelodysplastic syndrome, unspecified: Secondary | ICD-10-CM

## 2012-01-28 DIAGNOSIS — F341 Dysthymic disorder: Secondary | ICD-10-CM

## 2012-01-28 NOTE — Assessment & Plan Note (Signed)
Last OV with heme 03/2010 (Ha) Stable values, no treatment changes recommended CBC    Component Value Date/Time   WBC 3.9* 01/14/2012 1207   WBC 3.1 04/10/2011   WBC 2.9* 11/21/2010 1353   RBC 4.10* 01/14/2012 1207   RBC 3.52* 11/21/2010 1353   HGB 12.3* 01/14/2012 1207   HGB 10.9* 11/21/2010 1353   HCT 36.3* 01/14/2012 1207   HCT 30.3* 11/21/2010 1353   PLT 135.0* 01/14/2012 1207   PLT Clumped Platelets--Appears Adequate 11/21/2010 1353   MCV 88.7 01/14/2012 1207   MCV 86.1 11/21/2010 1353   MCH 30.9 11/21/2010 1353   MCH 31.1 10/20/2009 0505   MCHC 33.8 01/14/2012 1207   MCHC 35.9 11/21/2010 1353   RDW 13.5 01/14/2012 1207   RDW 13.9 11/21/2010 1353   LYMPHSABS 1.3 01/14/2012 1207   LYMPHSABS 1.2 11/21/2010 1353   MONOABS 0.3 01/14/2012 1207   MONOABS 0.3 11/21/2010 1353   EOSABS 0.1 01/14/2012 1207   EOSABS 0.0 11/21/2010 1353   BASOSABS 0.0 01/14/2012 1207   BASOSABS 0.0 11/21/2010 1353

## 2012-01-28 NOTE — Assessment & Plan Note (Signed)
The current medical regimen is effective;  continue present plan and medications. BP Readings from Last 3 Encounters:  01/28/12 140/72  01/14/12 140/70  12/17/11 130/82

## 2012-01-28 NOTE — Assessment & Plan Note (Signed)
Continue SSRI - stable symptoms

## 2012-01-28 NOTE — Patient Instructions (Signed)
It was good to see you today. We have reviewed your prior records including labs and tests today Medications reviewed, no changes at this time. Continue B12 as ongoing

## 2012-01-28 NOTE — Progress Notes (Signed)
Subjective:    Patient ID: [name] [name], male    DOB: 1917/04/30, 78 y.o.   MRN: [id number]  HPI  Here for follow up - OV for dizziness by my partner AVP 2 weeks ago Feels improved since episode - no recurrent spells, no falls or change in balance Feels unchanged after prior HHPT/OT eval: "I am not going to use no cane or walker"  Also reviewed chronic medical issues:  DM2 - on metformin solo tx - the patient reports compliance with medication(s) as prescribed. Denies adverse side effects. denies hypoglycemia - checks cbgs once daily  HTN - on dilt - the patient reports compliance with medication(s) as prescribed. Denies adverse side effects. No edema or chest pain   Hypothyroid - the patient reports compliance with medication(s) as prescribed. Denies adverse side effects. No weight changes, bowel changes  chronic balance problems with falls - chronic, intermittent vertigo spells refuses to use walker denies leg weakness, palpitations or syncope symptoms   Past Medical History  Diagnosis Date  . Arthritis   . Diabetes mellitus   . Heart murmur   . Hypertension   . Hypothyroid   . Prostate cancer 1998 dx  . MDS (myelodysplastic syndrome) 12/2006     hyperplastic on bone marrow bx 2008    Review of Systems  Constitutional: Negative for fever or weight change. Positive for fatigue. Respiratory: Negative for cough and shortness of breath.   Cardiovascular: Negative for chest pain or palpitations.      Objective:   Physical Exam  BP 140/72  Pulse 79  Temp 97.5 F (36.4 C) (Oral)  Ht 5\' 9"  (1.753 m)  Wt 153 lb (69.4 kg)  BMI 22.59 kg/m2  SpO2 93% Wt Readings from Last 3 Encounters:  01/28/12 153 lb (69.4 kg)  01/14/12 156 lb (70.761 kg)  12/17/11 152 lb 6.4 oz (69.128 kg)   Constitutional:  He appears younger than stated age, well-developed and well-nourished. No distress.  Dtr at side Neck: Normal range of motion. Neck supple. No JVD present. No thyromegaly  present.  Cardiovascular: Normal rate, regular rhythm and normal heart sounds.  2/6 syst murmur heard. no BLE edema Pulmonary/Chest: Effort normal and breath sounds normal. No respiratory distress. no wheezes.  Musculoskeletal: arthritic changes. S/p L TKR -Normal range of motion. Patient exhibits no gross deformity. Gait and balance appears normal today Neurological: he is alert and oriented to person, place, and time. No cranial nerve deficit. Coordination, speech and balance are normal.  Psychiatric: he has a normal mood and affect. behavior is normal. Judgment and thought content normal.   Lab Results  Component Value Date   WBC 3.9* 01/14/2012   HGB 12.3* 01/14/2012   HCT 36.3* 01/14/2012   PLT 135.0* 01/14/2012   GLUCOSE 138* 01/14/2012   CHOL 141 09/17/2011   TRIG 105.0 09/17/2011   HDL 45.70 09/17/2011   LDLCALC 74 09/17/2011   ALT 11 01/14/2012   AST 17 01/14/2012   NA 139 01/14/2012   K 4.0 01/14/2012   CL 103 01/14/2012   CREATININE 1.0 01/14/2012   BUN 17 01/14/2012   CO2 27 01/14/2012   TSH 0.98 01/14/2012   HGBA1C 6.2 09/17/2011   Ct Head Wo Contrast  01/16/2012  *RADIOLOGY REPORT*  Clinical Data: Dizziness, ataxia, and headache.  History of prostate cancer 15 years ago. Diabetes mellitus.  CT HEAD WITHOUT CONTRAST  Technique:  Contiguous axial images were obtained from the base of the skull through the vertex without contrast.  Comparison: 09/25/2009  Findings: There is no evidence for acute infarction, intracranial hemorrhage, mass lesion, hydrocephalus, or extra-axial fluid. Moderate age related atrophy and chronic microvascular ischemic change.  No sclerotic or lytic osseous lesions.  Vascular calcification.  No acute sinus or mastoid fluid.  Little change from priors.  IMPRESSION: Age related changes.  No acute abnormality.   Original Report Authenticated By: Elsie Stain, M.D.      Assessment & Plan:  See problem list. Medications and labs reviewed today.  Dizzy - episodic,  currently improved neuro exam benign - recent head ct and labs reviewed: Unremarkable - Continue supportive care and meclizine as needed  Time spent with pt/family today 25 minutes, greater than 50% time spent counseling patient on dizziness, depression and test/lab/medication review.

## 2012-02-07 ENCOUNTER — Other Ambulatory Visit: Payer: Self-pay

## 2012-02-07 ENCOUNTER — Emergency Department (HOSPITAL_COMMUNITY)
Admission: EM | Admit: 2012-02-07 | Discharge: 2012-02-07 | Disposition: A | Discharge status: Home / Self Care | Payer: Medicare Other | Attending: Emergency Medicine | Admitting: Emergency Medicine

## 2012-02-07 DIAGNOSIS — Z8546 Personal history of malignant neoplasm of prostate: Secondary | ICD-10-CM | POA: Insufficient documentation

## 2012-02-07 DIAGNOSIS — Z79899 Other long term (current) drug therapy: Secondary | ICD-10-CM | POA: Insufficient documentation

## 2012-02-07 DIAGNOSIS — I10 Essential (primary) hypertension: Secondary | ICD-10-CM | POA: Insufficient documentation

## 2012-02-07 DIAGNOSIS — R011 Cardiac murmur, unspecified: Secondary | ICD-10-CM | POA: Insufficient documentation

## 2012-02-07 DIAGNOSIS — E119 Type 2 diabetes mellitus without complications: Secondary | ICD-10-CM | POA: Insufficient documentation

## 2012-02-07 DIAGNOSIS — E039 Hypothyroidism, unspecified: Secondary | ICD-10-CM | POA: Insufficient documentation

## 2012-02-07 DIAGNOSIS — R531 Weakness: Secondary | ICD-10-CM

## 2012-02-07 DIAGNOSIS — D469 Myelodysplastic syndrome, unspecified: Secondary | ICD-10-CM | POA: Insufficient documentation

## 2012-02-07 DIAGNOSIS — R5381 Other malaise: Secondary | ICD-10-CM | POA: Insufficient documentation

## 2012-02-07 LAB — CBC WITH DIFFERENTIAL/PLATELET
Basophils Absolute: 0 10*3/uL (ref 0.0–0.1)
Eosinophils Absolute: 0.1 10*3/uL (ref 0.0–0.7)
Lymphs Abs: 1.1 10*3/uL (ref 0.7–4.0)
MCH: 29.8 pg (ref 26.0–34.0)
MCHC: 35.6 g/dL (ref 30.0–36.0)
MCV: 83.8 fL (ref 78.0–100.0)
Monocytes Absolute: 0.3 10*3/uL (ref 0.1–1.0)
Monocytes Relative: 10 % (ref 3–12)
Platelets: 100 10*3/uL — ABNORMAL LOW (ref 150–400)
RDW: 13.2 % (ref 11.5–15.5)
WBC: 3 10*3/uL — ABNORMAL LOW (ref 4.0–10.5)

## 2012-02-07 LAB — BASIC METABOLIC PANEL
BUN: 21 mg/dL (ref 6–23)
CO2: 27 mEq/L (ref 19–32)
Calcium: 9 mg/dL (ref 8.4–10.5)
Creatinine, Ser: 0.99 mg/dL (ref 0.50–1.35)

## 2012-02-07 NOTE — ED Provider Notes (Signed)
History     CSN: [id number]  Arrival date & time 02/07/12  1354   First MD Initiated Contact with Patient 02/07/12 1509      Chief Complaint  Patient presents with  . Anxiety    (Consider location/radiation/quality/duration/timing/severity/associated sxs/prior treatment) Patient is a 78 y.o. male presenting with weakness. The history is provided by the patient (the pt complains of dizziness and weakness for weeks.  pt saw his doctor for this). No language interpreter was used.  Weakness Primary symptoms do not include headaches or seizures. The symptoms began more than 1 week ago. The neurological symptoms are diffuse. Context: nothing.  Additional symptoms include weakness. Additional symptoms do not include hallucinations. Medical issues do not include seizures.    Past Medical History  Diagnosis Date  . Arthritis   . Diabetes mellitus   . Heart murmur   . Hypertension   . Hypothyroid   . Prostate cancer 1998 dx  . MDS (myelodysplastic syndrome) 12/2006     hyperplastic on bone marrow bx 2008    Past Surgical History  Procedure Date  . Appendectomy   . Total knee arthroplasty 2005    left    Family History  Problem Relation Age of Onset  . Prostate cancer Father 47    History  Substance Use Topics  . Smoking status: Never Smoker   . Smokeless tobacco: Not on file  . Alcohol Use: No      Review of Systems  Constitutional: Negative for fatigue.  HENT: Negative for congestion, sinus pressure and ear discharge.   Eyes: Negative for discharge.  Respiratory: Negative for cough.   Cardiovascular: Negative for chest pain.  Gastrointestinal: Negative for abdominal pain and diarrhea.  Genitourinary: Negative for frequency and hematuria.  Musculoskeletal: Negative for back pain.  Skin: Negative for rash.  Neurological: Positive for weakness. Negative for seizures and headaches.  Hematological: Negative.   Psychiatric/Behavioral: Negative for hallucinations.     Allergies  Review of patient's allergies indicates no known allergies.  Home Medications   Current Outpatient Rx  Name Route Sig Dispense Refill  . VITAMIN B-12 1000 MCG SL SUBL Sublingual Place 1 tablet (1,000 mcg total) under the tongue daily. 100 tablet 3  . DILTIAZEM HCL 120 MG PO TABS Oral Take 1 tablet (120 mg total) by mouth daily. 30 tablet 5  . LEVOTHYROXINE SODIUM 125 MCG PO TABS Oral Take 1 tablet (125 mcg total) by mouth daily. 30 tablet 5  . MECLIZINE HCL 12.5 MG PO TABS Oral Take 1 tablet (12.5 mg total) by mouth daily. 30 tablet 5  . METFORMIN HCL 500 MG PO TABS Oral Take 1 tablet (500 mg total) by mouth 2 (two) times daily with a meal. 60 tablet 5  . PAROXETINE HCL 10 MG PO TABS Oral Take 1 tablet (10 mg total) by mouth every morning. 30 tablet 5    BP 109/59  Pulse 84  Temp 98.2 F (36.8 C) (Oral)  Resp 20  SpO2 100%  Physical Exam  Constitutional: He is oriented to person, place, and time. He appears well-developed.  HENT:  Head: Normocephalic and atraumatic.  Eyes: Conjunctivae normal and EOM are normal. No scleral icterus.  Neck: Neck supple. No thyromegaly present.  Cardiovascular: Normal rate and regular rhythm.  Exam reveals no gallop and no friction rub.   No murmur heard. Pulmonary/Chest: No stridor. He has no wheezes. He has no rales. He exhibits no tenderness.  Abdominal: He exhibits no distension. There is no  tenderness. There is no rebound.  Musculoskeletal: Normal range of motion. He exhibits no edema.  Lymphadenopathy:    He has no cervical adenopathy.  Neurological: He is oriented to person, place, and time. Coordination normal.  Skin: No rash noted. No erythema.  Psychiatric: He has a normal mood and affect. His behavior is normal.    ED Course  Procedures (including critical care time)  Labs Reviewed  BASIC METABOLIC PANEL - Abnormal; Notable for the following:    Glucose, Bld 137 (*)     GFR calc non Af Amer 69 (*)     GFR calc  Af Amer 80 (*)     All other components within normal limits  CBC WITH DIFFERENTIAL - Abnormal; Notable for the following:    WBC 3.0 (*)     RBC 3.76 (*)     Hemoglobin 11.2 (*)     HCT 31.5 (*)     Platelets 100 (*)     Neutro Abs 1.5 (*)     All other components within normal limits   No results found.   1. Weakness       MDM   Pt told to use a walker or cane.  He refuses       Benny Lennert, MD 02/07/12 1754  Benny Lennert, MD 02/07/12 1754

## 2012-02-07 NOTE — ED Notes (Signed)
Per EMS, pt from home, reports feeling "nervousness", states "something is going on in my head".  Pt is ambulatory and is A&O x4.  Denies any pain at this time.

## 2012-03-17 ENCOUNTER — Other Ambulatory Visit (INDEPENDENT_AMBULATORY_CARE_PROVIDER_SITE_OTHER): Payer: Medicare Other

## 2012-03-17 ENCOUNTER — Ambulatory Visit (INDEPENDENT_AMBULATORY_CARE_PROVIDER_SITE_OTHER): Payer: Medicare Other | Admitting: Internal Medicine

## 2012-03-17 ENCOUNTER — Encounter: Payer: Self-pay | Admitting: Internal Medicine

## 2012-03-17 VITALS — BP 130/72 | HR 71 | Temp 97.1°F | Ht 69.0 in | Wt 156.6 lb

## 2012-03-17 DIAGNOSIS — E119 Type 2 diabetes mellitus without complications: Secondary | ICD-10-CM

## 2012-03-17 DIAGNOSIS — E039 Hypothyroidism, unspecified: Secondary | ICD-10-CM

## 2012-03-17 DIAGNOSIS — Z23 Encounter for immunization: Secondary | ICD-10-CM

## 2012-03-17 DIAGNOSIS — I10 Essential (primary) hypertension: Secondary | ICD-10-CM

## 2012-03-17 DIAGNOSIS — R42 Dizziness and giddiness: Secondary | ICD-10-CM

## 2012-03-17 LAB — HEMOGLOBIN A1C: Hgb A1c MFr Bld: 6.4 % (ref 4.6–6.5)

## 2012-03-17 MED ORDER — GABAPENTIN 100 MG PO CAPS: 100.0000 mg | ORAL_CAPSULE | Freq: Three times a day (TID) | ORAL | Status: DC

## 2012-03-17 MED ORDER — MECLIZINE HCL 12.5 MG PO TABS: 12.5000 mg | ORAL_TABLET | Freq: Three times a day (TID) | ORAL | Status: DC | PRN

## 2012-03-17 NOTE — Assessment & Plan Note (Signed)
Recurrent, chronic symptoms -refuses to use balance aide like walker or cane Increase meclizine to tid scheduled dosing as qd dose helps for transient period Also add gabapentin for ?neuropathy overlap with diabetes mellitus

## 2012-03-17 NOTE — Progress Notes (Signed)
Subjective:    Patient ID: [name] [name], male    DOB: January 01, 1918, 78 y.o.   MRN: [id number]  HPI  Here for follow up - reviewed chronic medical issues:  DM2 - on metformin solo tx - the patient reports compliance with medication(s) as prescribed. Denies adverse side effects. denies hypoglycemia - checks cbgs once daily  HTN - on dilt - the patient reports compliance with medication(s) as prescribed. Denies adverse side effects. No edema or chest pain   Hypothyroid - the patient reports compliance with medication(s) as prescribed. Denies adverse side effects. No weight changes, bowel changes  chronic balance problems with falls - chronic, intermittent vertigo spells refuses to use walker or cane denies leg weakness, palpitations or syncope symptoms   Past Medical History  Diagnosis Date  . Arthritis   . Diabetes mellitus   . Heart murmur   . Hypertension   . Hypothyroid   . Prostate cancer 1998 dx  . MDS (myelodysplastic syndrome) 12/2006     hyperplastic on bone marrow bx 2008    Review of Systems  Constitutional: Negative for fever or weight change. Positive for fatigue. Respiratory: Negative for cough and shortness of breath.   Cardiovascular: Negative for chest pain or palpitations.      Objective:   Physical Exam  BP 130/72  Pulse 71  Temp 97.1 F (36.2 C) (Oral)  Ht 5\' 9"  (1.753 m)  Wt 156 lb 9.6 oz (71.033 kg)  BMI 23.13 kg/m2  SpO2 99% Wt Readings from Last 3 Encounters:  03/17/12 156 lb 9.6 oz (71.033 kg)  01/28/12 153 lb (69.4 kg)  01/14/12 156 lb (70.761 kg)   Constitutional:  He appears younger than stated age, well-developed and well-nourished. No distress.  Dtr at side Neck: Normal range of motion. Neck supple. No JVD present. No thyromegaly present.  Cardiovascular: Normal rate, regular rhythm and normal heart sounds.  2/6 syst murmur heard. no BLE edema Pulmonary/Chest: Effort normal and breath sounds normal. No respiratory distress. no  wheezes.  Musculoskeletal: arthritic changes. S/p L TKR -Normal range of motion. Patient exhibits no gross deformity. Gait and balance appears normal today Neurological: he is alert and oriented to person, place, and time. No cranial nerve deficit. Coordination, speech and balance are normal.  Psychiatric: he has a normal mood and affect. behavior is normal. Judgment and thought content normal.   Lab Results  Component Value Date   WBC 3.0* 02/07/2012   HGB 11.2* 02/07/2012   HCT 31.5* 02/07/2012   PLT 100* 02/07/2012   GLUCOSE 137* 02/07/2012   CHOL 141 09/17/2011   TRIG 105.0 09/17/2011   HDL 45.70 09/17/2011   LDLCALC 74 09/17/2011   ALT 11 01/14/2012   AST 17 01/14/2012   NA 138 02/07/2012   K 4.2 02/07/2012   CL 102 02/07/2012   CREATININE 0.99 02/07/2012   BUN 21 02/07/2012   CO2 27 02/07/2012   TSH 0.98 01/14/2012   HGBA1C 6.2 09/17/2011   Ct Head Wo Contrast  01/16/2012  *RADIOLOGY REPORT*  Clinical Data: Dizziness, ataxia, and headache.  History of prostate cancer 15 years ago. Diabetes mellitus.  CT HEAD WITHOUT CONTRAST  Technique:  Contiguous axial images were obtained from the base of the skull through the vertex without contrast.  Comparison: 09/25/2009  Findings: There is no evidence for acute infarction, intracranial hemorrhage, mass lesion, hydrocephalus, or extra-axial fluid. Moderate age related atrophy and chronic microvascular ischemic change.  No sclerotic or lytic osseous lesions.  Vascular  calcification.  No acute sinus or mastoid fluid.  Little change from priors.  IMPRESSION: Age related changes.  No acute abnormality.   Original Report Authenticated By: Elsie Stain, M.D.      Assessment & Plan:  See problem list. Medications and labs reviewed today.  Dizzy - episodic, currently improved neuro exam benign - 01/2012 head ct and labs reviewed: Unremarkable - ER visit for same 02/07/12 - also unremarkable Continue supportive care and meclizine as needed

## 2012-03-17 NOTE — Assessment & Plan Note (Signed)
The current medical regimen is effective;  continue present plan and medications. BP Readings from Last 3 Encounters:  03/17/12 130/72  02/07/12 158/83  01/28/12 140/72

## 2012-03-17 NOTE — Assessment & Plan Note (Signed)
Metformin solo tx - Not on statin or ACEI rec ASA 81 qd reviewed a1c and lipids - check q6-17mo adjust tx as needed but will balance risk/benefit of tx with adv age Lab Results  Component Value Date   HGBA1C 6.2 09/17/2011

## 2012-03-17 NOTE — Patient Instructions (Signed)
It was good to see you today. We have reviewed your prior records including labs and tests today Medications reviewed, increase meclzine to 3x/day and start gabapentin to 3x/day -no other changes at this time. Your prescription(s) have been submitted to your pharmacy. Please take as directed and contact our office if you believe you are having problem(s) with the medication(s). Test(s) ordered today. Your results will be released to MyChart (or called to you) after review, usually within 72hours after test completion. If any changes need to be made, you will be notified at that same time. Tetanus and pneumonia shots given today Continue B12 as ongoing Please schedule followup in 4 months, call sooner if problems.

## 2012-03-17 NOTE — Assessment & Plan Note (Signed)
Check TSH, adjust as needed Lab Results  Component Value Date   TSH 0.98 01/14/2012

## 2012-03-18 ENCOUNTER — Other Ambulatory Visit: Payer: Self-pay | Admitting: *Deleted

## 2012-03-18 MED ORDER — METFORMIN HCL 500 MG PO TABS: 500.0000 mg | ORAL_TABLET | Freq: Two times a day (BID) | ORAL | Status: DC

## 2012-03-18 NOTE — Telephone Encounter (Signed)
R'cd fax from [facility] for refill of Metformin.

## 2012-06-03 ENCOUNTER — Other Ambulatory Visit: Payer: Self-pay | Admitting: *Deleted

## 2012-06-03 MED ORDER — PAROXETINE HCL 10 MG PO TABS: 10.0000 mg | ORAL_TABLET | ORAL | Status: DC

## 2012-07-16 ENCOUNTER — Other Ambulatory Visit (INDEPENDENT_AMBULATORY_CARE_PROVIDER_SITE_OTHER): Payer: Medicare Other

## 2012-07-16 ENCOUNTER — Encounter: Payer: Self-pay | Admitting: Internal Medicine

## 2012-07-16 ENCOUNTER — Ambulatory Visit (INDEPENDENT_AMBULATORY_CARE_PROVIDER_SITE_OTHER): Payer: Medicare Other | Admitting: Internal Medicine

## 2012-07-16 VITALS — BP 132/70 | HR 77 | Temp 98.4°F | Wt 156.8 lb

## 2012-07-16 DIAGNOSIS — F329 Major depressive disorder, single episode, unspecified: Secondary | ICD-10-CM

## 2012-07-16 DIAGNOSIS — F341 Dysthymic disorder: Secondary | ICD-10-CM

## 2012-07-16 DIAGNOSIS — I10 Essential (primary) hypertension: Secondary | ICD-10-CM

## 2012-07-16 DIAGNOSIS — E1149 Type 2 diabetes mellitus with other diabetic neurological complication: Secondary | ICD-10-CM

## 2012-07-16 DIAGNOSIS — R42 Dizziness and giddiness: Secondary | ICD-10-CM

## 2012-07-16 MED ORDER — PAROXETINE HCL 20 MG PO TABS: 20.0000 mg | ORAL_TABLET | ORAL | Status: DC

## 2012-07-16 NOTE — Assessment & Plan Note (Signed)
Continue SSRI , but reports "not doing anything" and wants to stop Family concerned may need increase dose, not DC Increase dose now -erx done verified no SI/HI

## 2012-07-16 NOTE — Patient Instructions (Signed)
It was good to see you today. We have reviewed your prior records including labs and tests today Medications reviewed, increase Paxil to 20mg  daily -no other changes at this time. Your prescription(s) have been submitted to your pharmacy. Please take as directed and contact our office if you believe you are having problem(s) with the medication(s). Test(s) ordered today. Your results will be released to MyChart (or called to you) after review, usually within 72hours after test completion. If any changes need to be made, you will be notified at that same time. we'll make referral to vestibular rehabilitation. Our office will contact you regarding appointment(s) once made. Please schedule followup in 4 months, call sooner if problems.

## 2012-07-16 NOTE — Assessment & Plan Note (Signed)
Recurrent, chronic symptoms -refuses to use balance aide like walker or cane conitnue meclizine to tid prn scheduled dosing as qd dose helps for transient period Also added gabapentin 03/2012 for ?neuropathy overlap with diabetes mellitus  Refer for vestib rehab

## 2012-07-16 NOTE — Assessment & Plan Note (Signed)
The current medical regimen is effective;  continue present plan and medications. BP Readings from Last 3 Encounters:  07/16/12 132/70  03/17/12 130/72  02/07/12 158/83

## 2012-07-16 NOTE — Assessment & Plan Note (Signed)
Metformin solo tx - Not on statin or ACEI rec ASA 81 qd reviewed a1c and lipids - check q6-22mo adjust tx as needed but will balance risk/benefit of tx with adv age Lab Results  Component Value Date   HGBA1C 6.4 03/17/2012

## 2012-07-16 NOTE — Progress Notes (Signed)
Subjective:    Patient ID: [name] [name], male    DOB: [birth year], 79 y.o.   MRN: [id number]  HPI  Here for follow up - reviewed chronic medical issues:  DM2 - on metformin solo tx - the patient reports compliance with medication(s) as prescribed. Denies adverse side effects. denies hypoglycemia - checks cbgs once daily:110s  HTN - on dilt - the patient reports compliance with medication(s) as prescribed. Denies adverse side effects. No edema or chest pain   Hypothyroid - the patient reports compliance with medication(s) as prescribed. Denies adverse side effects. No weight changes, bowel changes  chronic balance problems with falls - chronic, intermittent vertigo spells refuses to use walker or cane denies leg weakness, palpitations or syncope symptoms   Past Medical History  Diagnosis Date  . Arthritis   . Diabetes mellitus   . Heart murmur   . Hypertension   . Hypothyroid   . Prostate cancer 1998 dx  . MDS (myelodysplastic syndrome) 12/2006     hyperplastic on bone marrow bx 2008    Review of Systems  Constitutional: Negative for fever or weight change. Positive for fatigue, chronic and unchanged. Respiratory: Negative for cough and shortness of breath.   Cardiovascular: Negative for chest pain or palpitations.      Objective:   Physical Exam  BP 132/70  Pulse 77  Temp(Src) 98.4 F (36.9 C) (Oral)  Wt 156 lb 12.8 oz (71.124 kg)  BMI 23.14 kg/m2  SpO2 95% Wt Readings from Last 3 Encounters:  07/16/12 156 lb 12.8 oz (71.124 kg)  03/17/12 156 lb 9.6 oz (71.033 kg)  01/28/12 153 lb (69.4 kg)   Constitutional:  He appears younger than stated age, well-developed and well-nourished. No distress.  Dtr at side Neck: Normal range of motion. Neck supple. No JVD present. No thyromegaly present.  Cardiovascular: Normal rate, regular rhythm and normal heart sounds.  2/6 syst murmur heard. no BLE edema Pulmonary/Chest: Effort normal and breath sounds normal. No  respiratory distress. no wheezes.  Musculoskeletal: arthritic changes. S/p L TKR -Normal range of motion. Patient exhibits no gross deformity. Gait and balance appears normal today Neurological: he is alert and oriented to person, place, and time. No cranial nerve deficit. Coordination, speech and balance are normal.  Psychiatric: he has a normal mood and affect. behavior is normal. Judgment and thought content normal.   Lab Results  Component Value Date   WBC 3.0* 02/07/2012   HGB 11.2* 02/07/2012   HCT 31.5* 02/07/2012   PLT 100* 02/07/2012   GLUCOSE 137* 02/07/2012   CHOL 141 09/17/2011   TRIG 105.0 09/17/2011   HDL 45.70 09/17/2011   LDLCALC 74 09/17/2011   ALT 11 01/14/2012   AST 17 01/14/2012   NA 138 02/07/2012   K 4.2 02/07/2012   CL 102 02/07/2012   CREATININE 0.99 02/07/2012   BUN 21 02/07/2012   CO2 27 02/07/2012   TSH 0.98 01/14/2012   HGBA1C 6.4 03/17/2012   Ct Head Wo Contrast  01/16/2012  *RADIOLOGY REPORT*  Clinical Data: Dizziness, ataxia, and headache.  History of prostate cancer 15 years ago. Diabetes mellitus.  CT HEAD WITHOUT CONTRAST  Technique:  Contiguous axial images were obtained from the base of the skull through the vertex without contrast.  Comparison: 09/25/2009  Findings: There is no evidence for acute infarction, intracranial hemorrhage, mass lesion, hydrocephalus, or extra-axial fluid. Moderate age related atrophy and chronic microvascular ischemic change.  No sclerotic or lytic osseous lesions.  Vascular calcification.  No acute sinus or mastoid fluid.  Little change from priors.  IMPRESSION: Age related changes.  No acute abnormality.   Original Report Authenticated By: Elsie Stain, M.D.      Assessment & Plan:  See problem list. Medications and labs reviewed today.  Dizzy - episodic, currently improved neuro exam benign - 01/2012 head ct and labs reviewed: Unremarkable - ER visit for same 02/07/12 - also unremarkable Continue supportive care and meclizine as  needed Refer to vestibular rehab now

## 2012-07-29 ENCOUNTER — Ambulatory Visit: Payer: Medicare Other | Attending: Internal Medicine | Admitting: Physical Therapy

## 2012-07-29 DIAGNOSIS — IMO0001 Reserved for inherently not codable concepts without codable children: Secondary | ICD-10-CM | POA: Insufficient documentation

## 2012-07-29 DIAGNOSIS — R269 Unspecified abnormalities of gait and mobility: Secondary | ICD-10-CM | POA: Insufficient documentation

## 2012-07-29 DIAGNOSIS — R42 Dizziness and giddiness: Secondary | ICD-10-CM | POA: Insufficient documentation

## 2012-07-31 ENCOUNTER — Ambulatory Visit: Payer: Medicare Other | Admitting: Physical Therapy

## 2012-08-05 ENCOUNTER — Encounter: Payer: Medicare Other | Admitting: Physical Therapy

## 2012-08-26 ENCOUNTER — Other Ambulatory Visit: Payer: Self-pay | Admitting: *Deleted

## 2012-08-26 MED ORDER — LEVOTHYROXINE SODIUM 125 MCG PO TABS: 125.0000 ug | ORAL_TABLET | Freq: Every day | ORAL | Status: DC

## 2012-09-04 ENCOUNTER — Other Ambulatory Visit: Payer: Self-pay | Admitting: Internal Medicine

## 2012-09-09 ENCOUNTER — Other Ambulatory Visit: Payer: Self-pay | Admitting: *Deleted

## 2012-09-09 MED ORDER — DILTIAZEM HCL 120 MG PO TABS: 120.0000 mg | ORAL_TABLET | Freq: Every day | ORAL | Status: DC

## 2012-09-30 ENCOUNTER — Other Ambulatory Visit: Payer: Self-pay | Admitting: *Deleted

## 2012-09-30 MED ORDER — METFORMIN HCL 500 MG PO TABS: 500.0000 mg | ORAL_TABLET | Freq: Two times a day (BID) | ORAL | Status: DC

## 2012-10-22 ENCOUNTER — Other Ambulatory Visit: Payer: Self-pay | Admitting: Internal Medicine

## 2012-10-30 ENCOUNTER — Other Ambulatory Visit: Payer: Self-pay | Admitting: Internal Medicine

## 2012-11-17 ENCOUNTER — Encounter: Payer: Self-pay | Admitting: Internal Medicine

## 2012-11-17 ENCOUNTER — Ambulatory Visit (INDEPENDENT_AMBULATORY_CARE_PROVIDER_SITE_OTHER): Payer: Medicare Other | Admitting: Internal Medicine

## 2012-11-17 ENCOUNTER — Other Ambulatory Visit (INDEPENDENT_AMBULATORY_CARE_PROVIDER_SITE_OTHER): Payer: Medicare Other

## 2012-11-17 VITALS — BP 132/70 | HR 88 | Temp 97.6°F | Wt 156.4 lb

## 2012-11-17 DIAGNOSIS — I10 Essential (primary) hypertension: Secondary | ICD-10-CM

## 2012-11-17 DIAGNOSIS — E1149 Type 2 diabetes mellitus with other diabetic neurological complication: Secondary | ICD-10-CM

## 2012-11-17 DIAGNOSIS — D469 Myelodysplastic syndrome, unspecified: Secondary | ICD-10-CM

## 2012-11-17 DIAGNOSIS — E039 Hypothyroidism, unspecified: Secondary | ICD-10-CM

## 2012-11-17 LAB — BASIC METABOLIC PANEL
BUN: 18 mg/dL (ref 6–23)
CO2: 27 mEq/L (ref 19–32)
Calcium: 9.3 mg/dL (ref 8.4–10.5)
Chloride: 106 mEq/L (ref 96–112)
Creatinine, Ser: 0.9 mg/dL (ref 0.4–1.5)
Glucose, Bld: 199 mg/dL — ABNORMAL HIGH (ref 70–99)

## 2012-11-17 LAB — CBC WITH DIFFERENTIAL/PLATELET
Basophils Absolute: 0 10*3/uL (ref 0.0–0.1)
Eosinophils Absolute: 0.1 10*3/uL (ref 0.0–0.7)
Eosinophils Relative: 1.5 % (ref 0.0–5.0)
HCT: 33.7 % — ABNORMAL LOW (ref 39.0–52.0)
Lymphs Abs: 1.3 10*3/uL (ref 0.7–4.0)
MCHC: 34.2 g/dL (ref 30.0–36.0)
MCV: 87.6 fl (ref 78.0–100.0)
Monocytes Absolute: 0.3 10*3/uL (ref 0.1–1.0)
Neutrophils Relative %: 55.7 % (ref 43.0–77.0)
Platelets: 118 10*3/uL — ABNORMAL LOW (ref 150.0–400.0)
RDW: 14 % (ref 11.5–14.6)

## 2012-11-17 LAB — LIPID PANEL
Cholesterol: 149 mg/dL (ref 0–200)
Total CHOL/HDL Ratio: 3
Triglycerides: 227 mg/dL — ABNORMAL HIGH (ref 0.0–149.0)

## 2012-11-17 LAB — LDL CHOLESTEROL, DIRECT: Direct LDL: 89.1 mg/dL

## 2012-11-17 LAB — HEMOGLOBIN A1C: Hgb A1c MFr Bld: 6.4 % (ref 4.6–6.5)

## 2012-11-17 MED ORDER — LEVOTHYROXINE SODIUM 125 MCG PO TABS: 125.0000 ug | ORAL_TABLET | Freq: Every day | ORAL | Status: DC

## 2012-11-17 MED ORDER — DILTIAZEM HCL 120 MG PO TABS: 120.0000 mg | ORAL_TABLET | Freq: Every day | ORAL | Status: DC

## 2012-11-17 MED ORDER — METFORMIN HCL 500 MG PO TABS: 500.0000 mg | ORAL_TABLET | Freq: Every day | ORAL | Status: DC

## 2012-11-17 MED ORDER — METFORMIN HCL 500 MG PO TABS: 500.0000 mg | ORAL_TABLET | Freq: Two times a day (BID) | ORAL | Status: DC

## 2012-11-17 NOTE — Assessment & Plan Note (Signed)
Metformin solo tx - Not on statin or ACEI recommended ASA 81 qd reviewed a1c and lipids - check q6-42mo adjust tx as needed but will balance risk/benefit of tx with adv age Lab Results  Component Value Date   HGBA1C 6.2 07/16/2012

## 2012-11-17 NOTE — Assessment & Plan Note (Signed)
Last OV with heme 03/2010 ([facility]) Stable values, no treatment changes recommended Check CBC semi annually and as needed CBC    Component Value Date/Time   WBC 3.0* 02/07/2012 1530   WBC 3.1 04/10/2011   WBC 2.9* 11/21/2010 1353   RBC 3.76* 02/07/2012 1530   RBC 3.52* 11/21/2010 1353   RBC 4.01* 10/19/2009 0505   HGB 11.2* 02/07/2012 1530   HGB 10.9* 11/21/2010 1353   HCT 31.5* 02/07/2012 1530   HCT 30.3* 11/21/2010 1353   PLT 100* 02/07/2012 1530   PLT Clumped Platelets--Appears Adequate 11/21/2010 1353   MCV 83.8 02/07/2012 1530   MCV 86.1 11/21/2010 1353   MCH 29.8 02/07/2012 1530   MCH 30.9 11/21/2010 1353   MCHC 35.6 02/07/2012 1530   MCHC 35.9 11/21/2010 1353   RDW 13.2 02/07/2012 1530   RDW 13.9 11/21/2010 1353   LYMPHSABS 1.1 02/07/2012 1530   LYMPHSABS 1.2 11/21/2010 1353   MONOABS 0.3 02/07/2012 1530   MONOABS 0.3 11/21/2010 1353   EOSABS 0.1 02/07/2012 1530   EOSABS 0.0 11/21/2010 1353   BASOSABS 0.0 02/07/2012 1530   BASOSABS 0.0 11/21/2010 1353

## 2012-11-17 NOTE — Assessment & Plan Note (Signed)
The current medical regimen is effective;  continue present plan and medications. BP Readings from Last 3 Encounters:  11/17/12 132/70  07/16/12 132/70  03/17/12 130/72

## 2012-11-17 NOTE — Progress Notes (Signed)
Subjective:    Patient ID: [name] [name], male    DOB: [birth year], 79 y.o.   MRN: [id number]  HPI  Here for follow up - reviewed chronic medical issues:  DM2 - on metformin solo tx - the patient reports compliance with medication(s) as prescribed. Denies adverse side effects. denies hypoglycemia - checks cbgs once daily:110s  HTN - on dilt - the patient reports compliance with medication(s) as prescribed. Denies adverse side effects. No edema or chest pain   Hypothyroid - the patient reports compliance with medication(s) as prescribed. Denies adverse side effects. No weight changes, bowel changes  chronic balance problems with falls - chronic, intermittent vertigo spells refuses to use walker or cane denies leg weakness, palpitations or syncope symptoms  vestibular rehab spring 2014 - "did nothing"  Past Medical History  Diagnosis Date  . Arthritis   . Diabetes mellitus   . Heart murmur   . Hypertension   . Hypothyroid   . Prostate cancer 1998 dx  . MDS (myelodysplastic syndrome) 12/2006     hyperplastic on bone marrow bx 2008    Review of Systems  Constitutional: Negative for fever or weight change. Positive for fatigue, chronic and unchanged. Respiratory: Negative for cough and shortness of breath.   Cardiovascular: Negative for chest pain or palpitations.      Objective:   Physical Exam  BP 132/70  Pulse 88  Temp(Src) 97.6 F (36.4 C) (Oral)  Wt 156 lb 6.4 oz (70.943 kg)  BMI 23.09 kg/m2  SpO2 93% Wt Readings from Last 3 Encounters:  11/17/12 156 lb 6.4 oz (70.943 kg)  07/16/12 156 lb 12.8 oz (71.124 kg)  03/17/12 156 lb 9.6 oz (71.033 kg)   Constitutional:  He appears younger than stated age, well-developed and well-nourished. No distress.  Neck: Normal range of motion. Neck supple. No JVD or LAD present. No thyromegaly present.  Cardiovascular: Normal rate, regular rhythm and normal heart sounds.  2/6 syst murmur heard. trace, chronic BLE edema at  feet/ankles Pulmonary/Chest: Effort normal and breath sounds normal. No respiratory distress. no wheezes.  Musculoskeletal: arthritic changes. S/p L TKR -Normal range of motion. Patient exhibits no gross deformity. Gait and balance appears normal today Neurological: he is alert and oriented to person, place, and time. No cranial nerve deficit. Coordination, speech and balance are normal.  Psychiatric: he has a normal mood and affect. behavior is normal. Judgment and thought content normal.   Lab Results  Component Value Date   WBC 3.0* 02/07/2012   HGB 11.2* 02/07/2012   HCT 31.5* 02/07/2012   PLT 100* 02/07/2012   GLUCOSE 137* 02/07/2012   CHOL 141 09/17/2011   TRIG 105.0 09/17/2011   HDL 45.70 09/17/2011   LDLCALC 74 09/17/2011   ALT 11 01/14/2012   AST 17 01/14/2012   NA 138 02/07/2012   K 4.2 02/07/2012   CL 102 02/07/2012   CREATININE 0.99 02/07/2012   BUN 21 02/07/2012   CO2 27 02/07/2012   TSH 0.98 01/14/2012   HGBA1C 6.2 07/16/2012   Ct Head Wo Contrast  01/16/2012  *RADIOLOGY REPORT*  Clinical Data: Dizziness, ataxia, and headache.  History of prostate cancer 15 years ago. Diabetes mellitus.  CT HEAD WITHOUT CONTRAST  Technique:  Contiguous axial images were obtained from the base of the skull through the vertex without contrast.  Comparison: 09/25/2009  Findings: There is no evidence for acute infarction, intracranial hemorrhage, mass lesion, hydrocephalus, or extra-axial fluid. Moderate age related atrophy and chronic microvascular ischemic  change.  No sclerotic or lytic osseous lesions.  Vascular calcification.  No acute sinus or mastoid fluid.  Little change from priors.  IMPRESSION: Age related changes.  No acute abnormality.   Original Report Authenticated By: Elsie Stain, M.D.      Assessment & Plan:   See problem list. Medications and labs reviewed today.

## 2012-11-17 NOTE — Patient Instructions (Signed)
It was good to see you today. We have reviewed your prior records including labs and tests today Medications reviewed and updated -no recommended changes at this time. Your prescription(s) have been submitted to your pharmacy. Please take as directed and contact our office if you believe you are having problem(s) with the medication(s). Test(s) ordered today. Your results will be released to MyChart (or called to you) after review, usually within 72hours after test completion. If any changes need to be made, you will be notified at that same time. Please schedule followup in 4-6 months for diabetes mellitus check, call sooner if problems.

## 2012-11-17 NOTE — Assessment & Plan Note (Signed)
Check TSH, adjust as needed Lab Results  Component Value Date   TSH 0.98 01/14/2012

## 2012-11-18 ENCOUNTER — Ambulatory Visit: Payer: Medicare Other | Admitting: Internal Medicine

## 2013-02-15 ENCOUNTER — Observation Stay (HOSPITAL_COMMUNITY)
Admission: EM | Admit: 2013-02-15 | Discharge: 2013-02-16 | Disposition: A | Discharge status: Home / Self Care | Payer: Medicare Other | Attending: Internal Medicine | Admitting: Internal Medicine

## 2013-02-15 ENCOUNTER — Emergency Department (HOSPITAL_COMMUNITY): Payer: Medicare Other

## 2013-02-15 ENCOUNTER — Encounter (HOSPITAL_COMMUNITY): Payer: Self-pay | Admitting: Emergency Medicine

## 2013-02-15 DIAGNOSIS — D62 Acute posthemorrhagic anemia: Principal | ICD-10-CM | POA: Insufficient documentation

## 2013-02-15 DIAGNOSIS — I10 Essential (primary) hypertension: Secondary | ICD-10-CM | POA: Diagnosis present

## 2013-02-15 DIAGNOSIS — M899 Disorder of bone, unspecified: Secondary | ICD-10-CM | POA: Insufficient documentation

## 2013-02-15 DIAGNOSIS — R0600 Dyspnea, unspecified: Secondary | ICD-10-CM

## 2013-02-15 DIAGNOSIS — M1990 Unspecified osteoarthritis, unspecified site: Secondary | ICD-10-CM

## 2013-02-15 DIAGNOSIS — K922 Gastrointestinal hemorrhage, unspecified: Secondary | ICD-10-CM

## 2013-02-15 DIAGNOSIS — I079 Rheumatic tricuspid valve disease, unspecified: Secondary | ICD-10-CM | POA: Insufficient documentation

## 2013-02-15 DIAGNOSIS — R0602 Shortness of breath: Secondary | ICD-10-CM

## 2013-02-15 DIAGNOSIS — R270 Ataxia, unspecified: Secondary | ICD-10-CM

## 2013-02-15 DIAGNOSIS — D649 Anemia, unspecified: Secondary | ICD-10-CM

## 2013-02-15 DIAGNOSIS — R0989 Other specified symptoms and signs involving the circulatory and respiratory systems: Secondary | ICD-10-CM

## 2013-02-15 DIAGNOSIS — C61 Malignant neoplasm of prostate: Secondary | ICD-10-CM | POA: Insufficient documentation

## 2013-02-15 DIAGNOSIS — I700 Atherosclerosis of aorta: Secondary | ICD-10-CM | POA: Insufficient documentation

## 2013-02-15 DIAGNOSIS — R9431 Abnormal electrocardiogram [ECG] [EKG]: Secondary | ICD-10-CM

## 2013-02-15 DIAGNOSIS — I080 Rheumatic disorders of both mitral and aortic valves: Secondary | ICD-10-CM | POA: Insufficient documentation

## 2013-02-15 DIAGNOSIS — E039 Hypothyroidism, unspecified: Secondary | ICD-10-CM | POA: Diagnosis present

## 2013-02-15 DIAGNOSIS — E119 Type 2 diabetes mellitus without complications: Secondary | ICD-10-CM | POA: Insufficient documentation

## 2013-02-15 DIAGNOSIS — R61 Generalized hyperhidrosis: Secondary | ICD-10-CM | POA: Insufficient documentation

## 2013-02-15 DIAGNOSIS — R42 Dizziness and giddiness: Secondary | ICD-10-CM

## 2013-02-15 DIAGNOSIS — D469 Myelodysplastic syndrome, unspecified: Secondary | ICD-10-CM | POA: Diagnosis present

## 2013-02-15 DIAGNOSIS — I379 Nonrheumatic pulmonary valve disorder, unspecified: Secondary | ICD-10-CM | POA: Insufficient documentation

## 2013-02-15 DIAGNOSIS — F329 Major depressive disorder, single episode, unspecified: Secondary | ICD-10-CM

## 2013-02-15 DIAGNOSIS — E875 Hyperkalemia: Secondary | ICD-10-CM | POA: Insufficient documentation

## 2013-02-15 DIAGNOSIS — Z96659 Presence of unspecified artificial knee joint: Secondary | ICD-10-CM | POA: Insufficient documentation

## 2013-02-15 DIAGNOSIS — R0609 Other forms of dyspnea: Secondary | ICD-10-CM | POA: Insufficient documentation

## 2013-02-15 DIAGNOSIS — E1149 Type 2 diabetes mellitus with other diabetic neurological complication: Secondary | ICD-10-CM

## 2013-02-15 DIAGNOSIS — E538 Deficiency of other specified B group vitamins: Secondary | ICD-10-CM

## 2013-02-15 LAB — TROPONIN I: Troponin I: <0.3 ng/mL (ref <0.30)

## 2013-02-15 LAB — BASIC METABOLIC PANEL WITH GFR
BUN: 20 mg/dL (ref 6–23)
CO2: 24 meq/L (ref 19–32)
Calcium: 9.3 mg/dL (ref 8.4–10.5)
Chloride: 104 meq/L (ref 96–112)
Creatinine, Ser: 1.22 mg/dL (ref 0.50–1.35)
GFR calc Af Amer: 57 mL/min — ABNORMAL LOW (ref >90)
GFR calc non Af Amer: 49 mL/min — ABNORMAL LOW (ref >90)
Glucose, Bld: 149 mg/dL — ABNORMAL HIGH (ref 70–99)
Potassium: 5.5 meq/L — ABNORMAL HIGH (ref 3.5–5.1)
Sodium: 138 meq/L (ref 135–145)

## 2013-02-15 LAB — OCCULT BLOOD, POC DEVICE: Fecal Occult Bld: POSITIVE — AB

## 2013-02-15 LAB — HEMOGLOBIN AND HEMATOCRIT, BLOOD
HCT: 29 % — ABNORMAL LOW (ref 39.0–52.0)
Hemoglobin: 10 g/dL — ABNORMAL LOW (ref 13.0–17.0)

## 2013-02-15 LAB — CBC
HCT: 31.5 % — ABNORMAL LOW (ref 39.0–52.0)
Hemoglobin: 10.6 g/dL — ABNORMAL LOW (ref 13.0–17.0)
MCH: 29 pg (ref 26.0–34.0)
MCHC: 33.7 g/dL (ref 30.0–36.0)
MCV: 86.1 fL (ref 78.0–100.0)
Platelets: 137 K/uL — ABNORMAL LOW (ref 150–400)
RBC: 3.66 MIL/uL — ABNORMAL LOW (ref 4.22–5.81)
RDW: 13.2 % (ref 11.5–15.5)
WBC: 3.9 K/uL — ABNORMAL LOW (ref 4.0–10.5)

## 2013-02-15 LAB — PRO B NATRIURETIC PEPTIDE: Pro B Natriuretic peptide (BNP): 1204 pg/mL — ABNORMAL HIGH (ref 0–450)

## 2013-02-15 LAB — GLUCOSE, CAPILLARY: Glucose-Capillary: 143 mg/dL — ABNORMAL HIGH (ref 70–99)

## 2013-02-15 MED ORDER — ACETAMINOPHEN 650 MG RE SUPP: 650.0000 mg | Freq: Four times a day (QID) | RECTAL | Status: DC | PRN

## 2013-02-15 MED ORDER — DILTIAZEM HCL 60 MG PO TABS
120.0000 mg | ORAL_TABLET | Freq: Every day | ORAL | Status: DC
  Administered 2013-02-16: 120 mg via ORAL
  Filled 2013-02-15: qty 2

## 2013-02-15 MED ORDER — SODIUM CHLORIDE 0.9 % IV SOLN: 250.0000 mL | INTRAVENOUS | Status: DC | PRN

## 2013-02-15 MED ORDER — SODIUM POLYSTYRENE SULFONATE 15 GM/60ML PO SUSP
30.0000 g | Freq: Once | ORAL | Status: AC
  Administered 2013-02-15: 30 g via ORAL
  Filled 2013-02-15: qty 120

## 2013-02-15 MED ORDER — ACETAMINOPHEN 325 MG PO TABS: 650.0000 mg | ORAL_TABLET | Freq: Four times a day (QID) | ORAL | Status: DC | PRN

## 2013-02-15 MED ORDER — LEVOTHYROXINE SODIUM 125 MCG PO TABS
125.0000 ug | ORAL_TABLET | Freq: Every day | ORAL | Status: DC
  Administered 2013-02-16: 125 ug via ORAL
  Filled 2013-02-15 (×2): qty 1

## 2013-02-15 MED ORDER — SODIUM CHLORIDE 0.9 % IJ SOLN: 3.0000 mL | INTRAMUSCULAR | Status: DC | PRN

## 2013-02-15 MED ORDER — DILTIAZEM HCL ER COATED BEADS 120 MG PO CP24: 120.0000 mg | ORAL_CAPSULE | Freq: Every day | ORAL | Status: DC

## 2013-02-15 MED ORDER — SODIUM CHLORIDE 0.9 % IJ SOLN
3.0000 mL | Freq: Two times a day (BID) | INTRAMUSCULAR | Status: DC
  Administered 2013-02-15 – 2013-02-16 (×2): 3 mL via INTRAVENOUS

## 2013-02-15 MED ORDER — DILTIAZEM HCL 60 MG PO TABS: 120.0000 mg | ORAL_TABLET | Freq: Every day | ORAL | Status: DC

## 2013-02-15 MED ORDER — INSULIN ASPART 100 UNIT/ML ~~LOC~~ SOLN: 0.0000 [IU] | Freq: Three times a day (TID) | SUBCUTANEOUS | Status: DC

## 2013-02-15 NOTE — H&P (Addendum)
PCP:   [name], MD   Chief Complaint:  Shortness of breath  HPI: 79 year old male with history of diabetes mellitus, hypertension, hypothyroidism, prostate cancer, myelodysplastic syndrome who came to the ED [facility] EMS after patient had a short episode of shortness of breath and diaphoresis while working outside in his yard where he was working on the flowers. Patient says that he went inside the house and his shirt was soaking with sweat, he drank water but continued to experience shortness of breath so he called 911. He denies chest pain, denies nausea vomiting. Patient says that shortness of breath resolved of he was put on oxygen in the ambulance. He denies coughing, denies dizziness or lightheadedness. Patient has been having occasional night sweats over the past few weeks.  In the ED patient was found to have mild anemia, hyperkalemia, and junctional rhythm on the EKG. He was also found to have positive stool for occult blood. Patient's symptoms have completely resolved and is back to his baseline.  Allergies:   Allergies  Allergen Reactions  . Statins     Age>90      Past Medical History  Diagnosis Date  . Arthritis   . Diabetes mellitus   . Heart murmur   . Hypertension   . Hypothyroid   . Prostate cancer 1998 dx  . MDS (myelodysplastic syndrome) 12/2006     hyperplastic on bone marrow bx 2008    Past Surgical History  Procedure Laterality Date  . Appendectomy    . Total knee arthroplasty  2005    left    Prior to Admission medications   Medication Sig Start Date End Date Taking? Authorizing Provider  diltiazem (CARDIZEM) 120 MG tablet Take 1 tablet (120 mg total) by mouth daily. 11/17/12  Yes [name], MD  levothyroxine (SYNTHROID, LEVOTHROID) 125 MCG tablet Take 1 tablet (125 mcg total) by mouth daily. 11/17/12  Yes [name], MD  metFORMIN (GLUCOPHAGE) 500 MG tablet Take 1 tablet (500 mg total) by mouth daily with breakfast. 11/17/12  Yes  [name], MD  OVER THE COUNTER MEDICATION 4 capsules at bedtime. "Stool softner" Takes 4 capsules every night.   Yes Historical Provider, MD    Social History:  reports that he has never smoked. He has never used smokeless tobacco. He reports that he does not drink alcohol or use illicit drugs.  Family History  Problem Relation Age of Onset  . Prostate cancer Father 4     All the positives are listed in BOLD  Review of Systems:  HEENT: Headache, blurred vision, runny nose, sore throat Neck: Hypothyroidism, hyperthyroidism,,lymphadenopathy Chest : Shortness of breath, history of COPD, Asthma Heart : Chest pain, history of coronary arterey disease GI:  Nausea, vomiting, diarrhea, constipation, GERD GU: Dysuria, urgency, frequency of urination, hematuria Neuro: Stroke, seizures, syncope Psych: Depression, anxiety, hallucinations   Physical Exam: Blood pressure 122/95, pulse 71, temperature 98.8 F (37.1 C), temperature source Oral, resp. rate 20, SpO2 99.00%. Constitutional:   Patient is a well-developed and well-nourished male in no acute distress and cooperative with exam. Head: Normocephalic and atraumatic Mouth: Mucus membranes moist Eyes: PERRL, EOMI, conjunctivae normal Neck: Supple, No Thyromegaly Cardiovascular: RRR, S1 normal, S2 normal Pulmonary/Chest: CTAB, no wheezes, rales, or rhonchi Abdominal: Soft. Non-tender, non-distended, bowel sounds are normal, no masses, organomegaly, or guarding present.  Neurological: A&O x3, Strenght is normal and symmetric bilaterally, cranial nerve II-XII are grossly intact, no focal motor deficit, sensory intact to light touch  bilaterally.  Extremities : No Cyanosis, Clubbing or Edema   Labs on Admission:  Results for orders placed during the hospital encounter of 02/15/13 (from the past 48 hour(s))  BASIC METABOLIC PANEL     Status: Abnormal   Collection Time    02/15/13  3:46 PM      Result Value Range   Sodium 138   135 - 145 mEq/L   Potassium 5.5 (*) 3.5 - 5.1 mEq/L   Chloride 104  96 - 112 mEq/L   CO2 24  19 - 32 mEq/L   Glucose, Bld 149 (*) 70 - 99 mg/dL   BUN 20  6 - 23 mg/dL   Creatinine, Ser 2.95  0.50 - 1.35 mg/dL   Calcium 9.3  8.4 - 28.4 mg/dL   GFR calc non Af Amer 49 (*) >90 mL/min   GFR calc Af Amer 57 (*) >90 mL/min   Comment: (NOTE)     The eGFR has been calculated using the CKD EPI equation.     This calculation has not been validated in all clinical situations.     eGFR's persistently <90 mL/min signify possible Chronic Kidney     Disease.  CBC     Status: Abnormal   Collection Time    02/15/13  3:46 PM      Result Value Range   WBC 3.9 (*) 4.0 - 10.5 K/uL   RBC 3.66 (*) 4.22 - 5.81 MIL/uL   Hemoglobin 10.6 (*) 13.0 - 17.0 g/dL   HCT 13.2 (*) 44.0 - 10.2 %   MCV 86.1  78.0 - 100.0 fL   MCH 29.0  26.0 - 34.0 pg   MCHC 33.7  30.0 - 36.0 g/dL   RDW 72.5  36.6 - 44.0 %   Platelets 137 (*) 150 - 400 K/uL  POCT I-STAT TROPONIN I     Status: None   Collection Time    02/15/13  3:46 PM      Result Value Range   Troponin i, poc 0.00  0.00 - 0.08 ng/mL   Comment 3            Comment: Due to the release kinetics of cTnI,     a negative result within the first hours     of the onset of symptoms does not rule out     myocardial infarction with certainty.     If myocardial infarction is still suspected,     repeat the test at appropriate intervals.  OCCULT BLOOD, POC DEVICE     Status: Abnormal   Collection Time    02/15/13  4:21 PM      Result Value Range   Fecal Occult Bld POSITIVE (*) NEGATIVE    Radiological Exams on Admission: Dg Chest 2 View  02/15/2013   CLINICAL DATA:  Shortness of breath.  EXAM: CHEST  2 VIEW  COMPARISON:  January 14, 2012.  FINDINGS: The lungs are adequately inflated. There is subtle stable nodularity in the left mid lung likely related to the anterior aspect of the 4th rib. . The cardiac silhouette is normal in size. The pulmonary vascularity is not  engorged. There is no pneumothorax or significant pleural effusion. There is minimal stable blunting of the posterior costophrenic angles bilaterally. There are degenerative changes of the right shoulder.  IMPRESSION: 1. There is no evidence of pneumonia nor CHF or other acute cardiopulmonary abnormality. 2. There is stable subtle nodularity likely in the anterior aspect of the left 4th rib.  Electronically Signed   By: David  [place]   On: 02/15/2013 16:11    Assessment/Plan Active Problems:   Hypertension   Hypothyroid   MDS (myelodysplastic syndrome)   Dyspnea  79 year old male with history of hypertension, hypothyroidism, M.D.S. now presenting with shortness of dyspnea. Will admit the patient in telemetry, will obtain 2-D echo and 3 sets of cardiac enzymes. His EKG shows junctional rhythm. Patient has chronic anemia and today's hemoglobin is 10.6, which is down from 11.5 in August 2014. Patient does have a history of MDS. Patient had colonoscopy in the past and he is not aware of any abnormal diagnosis. Will monitor the patient's H&H every 6 hours and if hemoglobin drops less than 7 we'll transfuse PRBC. Also consider GI consult in a.m. if significant drop in hemoglobin overnight. We'll continue Cardizem and levothyroxine and hold the metformin at this time. Patient will be started on sliding-scale insulin. Will give DVT prophylaxis with SCDs. Patient has potassium of 5.5 we'll give 1 dose of Kayexalate 30 g by mouth x1 and recheck BMP in the morning.  Code status: Patient is full code  Family discussion: Discussed with patient's daughter at bedside in detail   Time Spent on Admission: 75 min  [facility] Pager: [phone] 02/15/2013, 5:48 PM  If 7PM-7AM, please contact night-coverage  [email or web address]  Password [email or web address]

## 2013-02-15 NOTE — ED Notes (Signed)
Per EMS: Pt is from home and was doing yard work and became short of breath. Pt denies chest pain, nausea, or emesis. EMS reports CBG of 216. Pt is A/O x4 and speaking in full sentences.

## 2013-02-15 NOTE — ED Provider Notes (Signed)
CSN: [id number]     Arrival date & time 02/15/13  1518 History   First MD Initiated Contact with Patient 02/15/13 1535     Chief Complaint  Patient presents with  . Shortness of Breath   (Consider location/radiation/quality/duration/timing/severity/associated sxs/prior Treatment) HPI Comments: Patient is a 79 y/o male with a PMHx of DM, arthritis, heart murmur, HTN, hypothyroid, prostate CA and MDS who presents to the ED via EMS complaining of a short episode of SOB and diaphoresis while outside working on his flowers. He immediately called EMS, when they arrived his shortness of breath subsided. No associated chest pain. Denies cough, wheezing, nausea, vomiting, lightheadedness or dizziness. CBG 216 on EMS arrival. Patient states over the past few weeks he has been having occasional night sweats. Denies fever, chills, abdominal pain, back pain. No recent medication changes or surgeries.  Patient is a 79 y.o. male presenting with shortness of breath. The history is provided by the patient.  Shortness of Breath Associated symptoms: diaphoresis     Past Medical History  Diagnosis Date  . Arthritis   . Diabetes mellitus   . Heart murmur   . Hypertension   . Hypothyroid   . Prostate cancer 1998 dx  . MDS (myelodysplastic syndrome) 12/2006     hyperplastic on bone marrow bx 2008   Past Surgical History  Procedure Laterality Date  . Appendectomy    . Total knee arthroplasty  2005    left   Family History  Problem Relation Age of Onset  . Prostate cancer Father 32   History  Substance Use Topics  . Smoking status: Never Smoker   . Smokeless tobacco: Not on file  . Alcohol Use: No    Review of Systems  Constitutional: Positive for diaphoresis.  Respiratory: Positive for shortness of breath.   All other systems reviewed and are negative.    Allergies  Statins  Home Medications   Current Outpatient Rx  Name  Route  Sig  Dispense  Refill  . diltiazem (CARDIZEM) 120 MG  tablet   Oral   Take 1 tablet (120 mg total) by mouth daily.   90 tablet   1   . Glucosamine-Chondroit-Vit C-Mn (GLUCOSAMINE CHONDR 1500 COMPLX) CAPS   Oral   Take by mouth 3 (three) times daily.         Marland Kitchen levothyroxine (SYNTHROID, LEVOTHROID) 125 MCG tablet   Oral   Take 1 tablet (125 mcg total) by mouth daily.   90 tablet   1   . metFORMIN (GLUCOPHAGE) 500 MG tablet   Oral   Take 1 tablet (500 mg total) by mouth daily with breakfast.   90 tablet   1    BP 122/95  Pulse 71  Temp(Src) 98.8 F (37.1 C) (Oral)  Resp 20  SpO2 99% Physical Exam  Nursing note and vitals reviewed. Constitutional: He is oriented to person, place, and time. He appears well-developed and well-nourished. No distress.  HENT:  Head: Normocephalic and atraumatic.  Mouth/Throat: Oropharynx is clear and moist.  Eyes: Conjunctivae and EOM are normal. Pupils are equal, round, and reactive to light.  Neck: Normal range of motion. Neck supple.  Cardiovascular: Normal rate, regular rhythm, normal heart sounds and intact distal pulses.   No extremity edema.  Pulmonary/Chest: Effort normal and breath sounds normal. No respiratory distress. He has no decreased breath sounds. He has no wheezes. He has no rhonchi. He has no rales.  Abdominal: Soft. Bowel sounds are normal. There is no  tenderness.  Musculoskeletal: Normal range of motion. He exhibits no edema.  Neurological: He is alert and oriented to person, place, and time.  Skin: Skin is warm and dry. He is not diaphoretic.  Psychiatric: He has a normal mood and affect. His behavior is normal.    ED Course  Procedures (including critical care time) Labs Review Labs Reviewed  BASIC METABOLIC PANEL - Abnormal; Notable for the following:    Potassium 5.5 (*)    Glucose, Bld 149 (*)    GFR calc non Af Amer 49 (*)    GFR calc Af Amer 57 (*)    All other components within normal limits  CBC - Abnormal; Notable for the following:    WBC 3.9 (*)    RBC  3.66 (*)    Hemoglobin 10.6 (*)    HCT 31.5 (*)    Platelets 137 (*)    All other components within normal limits  OCCULT BLOOD, POC DEVICE - Abnormal; Notable for the following:    Fecal Occult Bld POSITIVE (*)    All other components within normal limits  POCT I-STAT TROPONIN I   Imaging Review Dg Chest 2 View  02/15/2013   CLINICAL DATA:  Shortness of breath.  EXAM: CHEST  2 VIEW  COMPARISON:  January 14, 2012.  FINDINGS: The lungs are adequately inflated. There is subtle stable nodularity in the left mid lung likely related to the anterior aspect of the 4th rib. . The cardiac silhouette is normal in size. The pulmonary vascularity is not engorged. There is no pneumothorax or significant pleural effusion. There is minimal stable blunting of the posterior costophrenic angles bilaterally. There are degenerative changes of the right shoulder.  IMPRESSION: 1. There is no evidence of pneumonia nor CHF or other acute cardiopulmonary abnormality. 2. There is stable subtle nodularity likely in the anterior aspect of the left 4th rib.   Electronically Signed   By: David  [place]   On: 02/15/2013 16:11    EKG Interpretation     Ventricular Rate:  64 PR Interval:    QRS Duration: 91 QT Interval:  398 QTC Calculation: 411 R Axis:   7 Text Interpretation:  Junctional rhythm Nonspecific T abnormalities, lateral leads            MDM   1. Shortness of breath   2. Abnormal EKG   3. GI bleed    Patient with SOB and diaphoresis. CBC with 1 point drop in hgb since August, occult positive stool. CXR without acute findings. Troponin negative. EKG showing junctional rhythm, change from prior. He is well appearing and in NAD, normal VS, 02 sat 99% on RA. AAOx3. Asymptomatic in ED. Patient will be admitted for symptomatic anemia, GI bleed, sob. Admission accepted by Dr. Sharl Ma, [facility]. Case discussed with attending Dr. Micheline Maze who also evaluated patient and agrees with plan of care.      Trevor Mace, PA-C 02/15/13 1705

## 2013-02-16 ENCOUNTER — Telehealth: Payer: Self-pay | Admitting: *Deleted

## 2013-02-16 DIAGNOSIS — I059 Rheumatic mitral valve disease, unspecified: Secondary | ICD-10-CM

## 2013-02-16 DIAGNOSIS — D649 Anemia, unspecified: Secondary | ICD-10-CM

## 2013-02-16 DIAGNOSIS — D469 Myelodysplastic syndrome, unspecified: Secondary | ICD-10-CM

## 2013-02-16 LAB — CBC
Hemoglobin: 9.8 g/dL — ABNORMAL LOW (ref 13.0–17.0)
MCV: 84.7 fL (ref 78.0–100.0)
Platelets: 120 10*3/uL — ABNORMAL LOW (ref 150–400)
RBC: 3.33 MIL/uL — ABNORMAL LOW (ref 4.22–5.81)
RDW: 13 % (ref 11.5–15.5)
WBC: 2.8 10*3/uL — ABNORMAL LOW (ref 4.0–10.5)

## 2013-02-16 LAB — GLUCOSE, CAPILLARY
Glucose-Capillary: 113 mg/dL — ABNORMAL HIGH (ref 70–99)
Glucose-Capillary: 104 mg/dL — ABNORMAL HIGH (ref 70–99)

## 2013-02-16 LAB — FOLATE: Folate: 19.4 ng/mL

## 2013-02-16 LAB — COMPREHENSIVE METABOLIC PANEL
AST: 11 U/L (ref 0–37)
Albumin: 3.2 g/dL — ABNORMAL LOW (ref 3.5–5.2)
BUN: 21 mg/dL (ref 6–23)
CO2: 24 mEq/L (ref 19–32)
Calcium: 8.9 mg/dL (ref 8.4–10.5)
Chloride: 103 mEq/L (ref 96–112)
Creatinine, Ser: 1.13 mg/dL (ref 0.50–1.35)
Sodium: 136 mEq/L (ref 135–145)
Total Bilirubin: 0.3 mg/dL (ref 0.3–1.2)

## 2013-02-16 LAB — RETICULOCYTES: Retic Count, Absolute: 46.2 10*3/uL (ref 19.0–186.0)

## 2013-02-16 LAB — TROPONIN I
Troponin I: <0.3 ng/mL (ref <0.30)
Troponin I: <0.3 ng/mL (ref <0.30)

## 2013-02-16 LAB — VITAMIN B12: Vitamin B-12: 614 pg/mL (ref 211–911)

## 2013-02-16 LAB — FERRITIN: Ferritin: 18 ng/mL — ABNORMAL LOW (ref 22–322)

## 2013-02-16 LAB — TSH: TSH: 0.646 u[IU]/mL (ref 0.350–4.500)

## 2013-02-16 NOTE — Progress Notes (Signed)
Pt discharged home.  Reviewed discharge papers with pt and his daughter.  They are aware of follow up appointments.  There were no further questions.

## 2013-02-16 NOTE — Evaluation (Signed)
Physical Therapy Evaluation Patient Details Name: [name] [name] MRN: [id number] DOB: [birth year] Today's Date: 02/16/2013 Time: 4010-2725 PT Time Calculation (min): 12 min  PT Assessment / Plan / Recommendation History of Present Illness  Pt is a 79 year old male with history of diabetes mellitus, hypertension, hypothyroidism, prostate cancer, myelodysplastic syndrome who came to the ED Y. EMS after patient had a short episode of shortness of breath and diaphoresis while working outside in his yard where he was working on the flowers. Patient says that he went inside the house and his shirt was soaking with sweat, he drank water but continued to experience shortness of breath so he called 911. He denies chest pain, denies nausea vomiting. Patient says that shortness of breath resolved of he was put on oxygen in the ambulance. He denies coughing, denies dizziness or lightheadedness. Patient has been having occasional night sweats over the past few weeks.    Clinical Impression  Pt admitted for above. Pt currently presenting with functional limitations due to deficits listed below (see PT Problem List). Pt would benefit from skilled PT to increase independence and safety and to allow d/c home. Pt able to ambulation with 1HHA and min assist during LOB episodes. RN reports pt is being discharged this afternoon and pt was sitting up in chair, dressed and ready to go upon arrival.   Pt reports daughter is taking pt home from hospital, PT recommends supervision for OOB for safety and HHPT to improve balance.    PT Assessment  All further PT needs can be met in the next venue of care    Follow Up Recommendations  Home health PT;Supervision for mobility/OOB    Does the patient have the potential to tolerate intense rehabilitation      Barriers to Discharge Decreased caregiver support      Equipment Recommendations  None recommended by PT    Recommendations for Other Services     Frequency       Precautions / Restrictions Precautions Precautions: Fall Restrictions Weight Bearing Restrictions: No   Pertinent Vitals/Pain No c/o pain/SOB during session.      Mobility  Bed Mobility Bed Mobility: Not assessed Details for Bed Mobility Assistance: pt sitting up in chair upon arrival. Transfers Transfers: Sit to Stand;Stand to Sit Sit to Stand: 5: Supervision;From chair/3-in-1;With upper extremity assist;With armrests Stand to Sit: 5: Supervision;To chair/3-in-1;With upper extremity assist;With armrests Details for Transfer Assistance: supervision to ensure safety. Ambulation/Gait Ambulation/Gait Assistance: 4: Min assist Ambulation Distance (Feet): 250 Feet Assistive device: 1 person hand held assist Ambulation/Gait Assistance Details: 1HHA to serve as AD and min assist to correct balance during turns and horizontal head turns, otherwise pt min guard to ensure safety. Gait Pattern: Decreased stride length;Decreased dorsiflexion - right;Decreased dorsiflexion - left;Trunk flexed;Step-through pattern Gait velocity: decreased General Gait Details: pt with decreased DF (R more than L)    Exercises     PT Diagnosis: Difficulty walking;Generalized weakness  PT Problem List: Decreased strength;Decreased activity tolerance;Decreased mobility;Decreased balance;Decreased knowledge of use of DME PT Treatment Interventions:       PT Goals(Current goals can be found in the care plan section) Acute Rehab PT Goals Patient Stated Goal: to go home PT Goal Formulation: With patient Time For Goal Achievement: 02/23/13 Potential to Achieve Goals: Good  Visit Information  Last PT Received On: 02/16/13 Assistance Needed: +1 History of Present Illness: Pt is a 79 year old male with history of diabetes mellitus, hypertension, hypothyroidism, prostate cancer, myelodysplastic syndrome who came to the ED  [facility]. EMS after patient had a short episode of shortness of breath and diaphoresis while  working outside in his yard where he was working on the flowers. Patient says that he went inside the house and his shirt was soaking with sweat, he drank water but continued to experience shortness of breath so he called 911. He denies chest pain, denies nausea vomiting. Patient says that shortness of breath resolved of he was put on oxygen in the ambulance. He denies coughing, denies dizziness or lightheadedness. Patient has been having occasional night sweats over the past few weeks.         Prior Functioning  Home Living Family/patient expects to be discharged to:: Private residence Living Arrangements: Alone Available Help at Discharge: Family;Available PRN/intermittently Type of Home: House Home Access: Stairs to enter [employer] of Steps: 4 Entrance Stairs-Rails: Right Home Layout: One level Home Equipment: Walker - 2 wheels;Cane - single point Prior Function Level of Independence: Independent Comments: pt reports he does not use AD to ambulate but holds onto furniture while ambulating at home and has too many falls in the past 6 months to count. Communication Communication: No difficulties    Cognition  Cognition Arousal/Alertness: Awake/alert Behavior During Therapy: WFL for tasks assessed/performed Overall Cognitive Status: Within Functional Limits for tasks assessed    Extremity/Trunk Assessment Lower Extremity Assessment Lower Extremity Assessment: Generalized weakness   Balance    End of Session PT - End of Session Activity Tolerance: Patient tolerated treatment well Patient left: in chair;with call bell/phone within reach Nurse Communication: Mobility status;Other (comment) (RN and case manager notified PT recommends HHPT to improve balance)  GP Functional Assessment Tool Used: clinical judgement Functional Limitation: Mobility: Walking and moving around Mobility: Walking and Moving Around Current Status [phone]): At least 20 percent but less than 40  percent impaired, limited or restricted Mobility: Walking and Moving Around Goal Status [phone]): At least 20 percent but less than 40 percent impaired, limited or restricted Mobility: Walking and Moving Around Discharge Status [phone]): At least 20 percent but less than 40 percent impaired, limited or restricted   Sol Blazing 02/16/2013, 4:09 PM

## 2013-02-16 NOTE — Evaluation (Signed)
I have reviewed this note and agree with all findings. Kati Teniola Tseng, PT, DPT Pager: [phone]   

## 2013-02-16 NOTE — Progress Notes (Signed)
Utilization review completed.  

## 2013-02-16 NOTE — Progress Notes (Signed)
PT is recommending HHPT. I met with pt at bedside to discuss this. He stated he does not want to have the services set up as he has had it in the past and was not of any benefit. He stated he lives alone and is independent of his ADL's. He has a daughter that is very supportive.  Algernon Huxley RN BSN  [phone]

## 2013-02-16 NOTE — Telephone Encounter (Signed)
Received call from Dr. Izola Price pt is currently in hosp and wanting to go home. Going to discharge pt today but wanted to let md know blood count is slightly low. Pt need to be follow-up in 1 week & have his CBC check as well. Also pt had some blood in his stool. They contacted GI but due to pt medical history they are suggestion that he need to see oncologist. Dr. Izola Price has contacted [name] and is willing to see pt, but will need referral entered ASAP. Went ahead and made f/u appt for next thurs 02/25/13...Raechel Chute

## 2013-02-16 NOTE — Progress Notes (Signed)
Echocardiogram 2D Echocardiogram has been performed.  [name] [name] 02/16/2013, 3:56 PM

## 2013-02-16 NOTE — Discharge Summary (Signed)
Physician Discharge Summary  [name] [name]:[id number] DOB: 08-14-1917 DOA: 02/15/2013  PCP: [name] Paci, MD  Admit date: 02/15/2013 Discharge date: 02/16/2013  Recommendations for Outpatient Follow-up:  1. Pt will need to follow up with PCP in 2-3 weeks post discharge 2. Please obtain BMP to evaluate electrolytes and kidney function 3. Please also check CBC to evaluate Hg and Hct levels 4. Please note that anemia panel, TSH, 2 D ECHO pending upon discharge and need to be followed up  5. Please note that I spoke with oncologist [name] and he recommended this to be new referral, pt will be scheduled for the appointment  6. Please note I also spoke with [facility] as pt may need colonoscopy   Discharge Diagnoses: Acute blood loss anemia  Active Problems:   Hypertension   Hypothyroid   MDS (myelodysplastic syndrome)   Dyspnea  Discharge Condition: Stable  Diet recommendation: Heart healthy diet discussed in details   History of present illness:  79 year old male with history of diabetes mellitus, hypertension, hypothyroidism, prostate cancer, myelodysplastic syndrome who came to the ED via EMS after patient had a short episode of shortness of breath and diaphoresis while working outside in his yard where he was working on the flowers. Patient says that he went inside the house and his shirt was soaking with sweat, he drank water but continued to experience shortness of breath so he called 911. He denies chest pain, denies nausea, vomiting. Patient says that shortness of breath resolved as he was put on oxygen in the ambulance. He denies coughing, denies dizziness or lightheadedness. Patient has been having occasional night sweats over the past few weeks.   In the ED patient was found to have mild anemia, hyperkalemia, and junctional rhythm on the EKG. He was also found to have positive stool for occult blood. Patient's symptoms have completely resolved and is back to his  baseline.  Hospital Course:  Active Problems:   Dyspnea - possibly related to acute blood loss anemia - now resolved and pt has been maintaining oxygen saturations at target range  - 2 D ECHO pending upon discharge    Acute blood loss anemia - imposed on chronic anemia in the setting o MDS - please note that oncologist [name] recommended follow up in their office for further evaluation - anaemia panel and TSH pending on discharge  - [facility] consulted and outpatient follow up recommended as well    Hypertension - reasonable inpatient control    Hypothyroid - continue home medical regimen    MDS (myelodysplastic syndrome) - will need oncologist to follow up with as noted above   Procedures/Studies: Dg Chest 2 View   02/15/2013 There is no evidence of pneumonia, CHF. There is stable subtle nodularity likely in the anterior aspect of the left 4th rib.     Consultations:  Oncologist [name] (over the phone)  [facility] - [name] [name] )over the phone)  Antibiotics:  None  Discharge Exam: Filed Vitals:   02/16/13 0508  BP: 151/69  Pulse: 71  Temp: 98 F (36.7 C)  Resp: 20   Filed Vitals:   02/15/13 1754 02/15/13 2006 02/15/13 2100 02/16/13 0508  BP: 127/55 127/57 158/78 151/69  Pulse: 70 78 87 71  Temp:  98.9 F (37.2 C) 97.9 F (36.6 C) 98 F (36.7 C)  TempSrc:  Oral Oral Oral  Resp: 13 16 18 20   Height:   5\' 9"  (1.753 m)   Weight:  69.7 kg (153 lb 10.6 oz)   SpO2: 100% 100% 100% 99%    General: Pt is alert, follows commands appropriately, not in acute distress Cardiovascular: Regular rate and rhythm, S1/S2 +, no murmurs, no rubs, no gallops Respiratory: Clear to auscultation bilaterally, no wheezing, no crackles, no rhonchi Abdominal: Soft, non tender, non distended, bowel sounds +, no guarding Extremities: no edema, no cyanosis, pulses palpable bilaterally DP and PT Neuro: Grossly nonfocal  Discharge Instructions   Future Appointments Provider  Department Dept Phone   05/26/2013 4:15 PM [name], MD [name] [phone]       Medication List         diltiazem 120 MG tablet  Commonly known as:  CARDIZEM  Take 1 tablet (120 mg total) by mouth daily.     levothyroxine 125 MCG tablet  Commonly known as:  SYNTHROID, LEVOTHROID  Take 1 tablet (125 mcg total) by mouth daily.     metFORMIN 500 MG tablet  Commonly known as:  GLUCOPHAGE  Take 1 tablet (500 mg total) by mouth daily with breakfast.     OVER THE COUNTER MEDICATION  4 capsules at bedtime. "Stool softner" Takes 4 capsules every night.           Follow-up Information   Follow up with [name] Paci, MD In 1 week.   Specialty:  Internal Medicine   Contact information:   [address] [place] [address] [phone]       Follow up with [facility]. (you will receive phone call about the appointment time and date with hematologist, their number is [phone])        The results of significant diagnostics from this hospitalization (including imaging, microbiology, ancillary and laboratory) are listed below for reference.     Microbiology: No results found for this or any previous visit (from the past 240 hour(s)).   Labs: Basic Metabolic Panel:  Recent Labs Lab 02/15/13 1546 02/16/13 0150  NA 138 136  K 5.5* 4.5  CL 104 103  CO2 24 24  GLUCOSE 149* 118*  BUN 20 21  CREATININE 1.22 1.13  CALCIUM 9.3 8.9   Liver Function Tests:  Recent Labs Lab 02/16/13 0150  AST 11  ALT 6  ALKPHOS 81  BILITOT 0.3  PROT 6.0  ALBUMIN 3.2*   CBC:  Recent Labs Lab 02/15/13 1546 02/15/13 2105 02/16/13 0150  WBC 3.9*  --  2.8*  HGB 10.6* 10.0* 9.8*  HCT 31.5* 29.0* 28.2*  MCV 86.1  --  84.7  PLT 137*  --  120*   Cardiac Enzymes:  Recent Labs Lab 02/15/13 2105 02/16/13 0150 02/16/13 0802  TROPONINI <0.30 <0.30 <0.30   BNP: BNP (last 3  results)  Recent Labs  02/15/13 2105  PROBNP 1204.0*   CBG:  Recent Labs Lab 02/15/13 2133 02/16/13 0728  GLUCAP 143* 113*   SIGNED: Time coordinating discharge: Over 30 minutes  Debbora Presto, MD  [facility] 02/16/2013, 11:40 AM Pager [phone]  If 7PM-7AM, please contact night-coverage [email or web address] Password [email or web address]

## 2013-02-17 NOTE — ED Provider Notes (Signed)
Medical screening examination/treatment/procedure(s) were conducted as a shared visit with non-physician practitioner(s) and myself.  I personally evaluated the patient during the encounter.Pt presents w/ sudden onset SOB, diaphoresis and new junctional rhythm on EKG.  On PE, pt well-appearing, in NAD.  Cardiopulm exam benign.  Given concern for anginal equivalent, pt will be admitted to [facility].    EKG Interpretation     Ventricular Rate:  64 PR Interval:    QRS Duration: 91 QT Interval:  398 QTC Calculation: 411 R Axis:   7 Text Interpretation:  Junctional rhythm Nonspecific T abnormalities, lateral leads              Shanna Cisco, MD 02/17/13 1054

## 2013-02-17 NOTE — Telephone Encounter (Signed)
Thanks- Refer ordered as requested Will check labs when pt comes in for HFU

## 2013-02-24 ENCOUNTER — Telehealth: Payer: Self-pay | Admitting: Hematology and Oncology

## 2013-02-24 NOTE — Telephone Encounter (Signed)
C/D 02/24/13 for appt. 03/01/13 °

## 2013-02-25 ENCOUNTER — Other Ambulatory Visit (INDEPENDENT_AMBULATORY_CARE_PROVIDER_SITE_OTHER): Payer: Medicare Other

## 2013-02-25 ENCOUNTER — Encounter: Payer: Self-pay | Admitting: Internal Medicine

## 2013-02-25 ENCOUNTER — Ambulatory Visit (INDEPENDENT_AMBULATORY_CARE_PROVIDER_SITE_OTHER): Payer: Medicare Other | Admitting: Internal Medicine

## 2013-02-25 VITALS — BP 118/62 | HR 73 | Temp 97.1°F | Wt 153.1 lb

## 2013-02-25 DIAGNOSIS — E039 Hypothyroidism, unspecified: Secondary | ICD-10-CM

## 2013-02-25 DIAGNOSIS — D469 Myelodysplastic syndrome, unspecified: Secondary | ICD-10-CM

## 2013-02-25 DIAGNOSIS — I10 Essential (primary) hypertension: Secondary | ICD-10-CM

## 2013-02-25 LAB — CBC WITH DIFFERENTIAL/PLATELET
Basophils Absolute: 0 10*3/uL (ref 0.0–0.1)
Basophils Relative: 0.4 % (ref 0.0–3.0)
Hemoglobin: 11.8 g/dL — ABNORMAL LOW (ref 13.0–17.0)
Lymphocytes Relative: 35.8 % (ref 12.0–46.0)
MCHC: 34.5 g/dL (ref 30.0–36.0)
MCV: 85.3 fl (ref 78.0–100.0)
Monocytes Absolute: 0.5 10*3/uL (ref 0.1–1.0)
Monocytes Relative: 10.4 % (ref 3.0–12.0)
Neutro Abs: 2.4 10*3/uL (ref 1.4–7.7)
Neutrophils Relative %: 51.4 % (ref 43.0–77.0)
Platelets: 171 10*3/uL (ref 150.0–400.0)
RDW: 13.9 % (ref 11.5–14.6)

## 2013-02-25 NOTE — Assessment & Plan Note (Signed)
The current medical regimen is effective;  continue present plan and medications. BP Readings from Last 3 Encounters:  02/25/13 118/62  02/16/13 151/69  11/17/12 132/70

## 2013-02-25 NOTE — Patient Instructions (Signed)
It was good to see you today.  We have reviewed your prior records including labs and tests today  Test(s) ordered today. Your results will be released to MyChart (or called to you) after review, usually within 72hours after test completion. If any changes need to be made, you will be notified at that same time.  Medications reviewed and updated, no changes recommended at this time.  Keep appointment with Dr Bertis Ruddy Monday 03/01/13 at 1pm as scheduled  Please schedule followup in 3-4 months, call sooner if problems.

## 2013-02-25 NOTE — Progress Notes (Signed)
  Subjective:    Patient ID: [name] [name], male    DOB: [birth year], 79 y.o.   MRN: [id number]  HPI Here for hospital follow up - also reviewed chronic medical issues:  DM2 - on metformin solo tx - the patient reports compliance with medication(s) as prescribed. Denies adverse side effects. denies hypoglycemia - checks cbgs once daily:110s  HTN - on dilt - the patient reports compliance with medication(s) as prescribed. Denies adverse side effects. No edema or chest pain   Hypothyroid - the patient reports compliance with medication(s) as prescribed. Denies adverse side effects. No weight changes, bowel changes  chronic balance problems with falls - chronic, intermittent vertigo spells refuses to use walker or cane denies leg weakness, palpitations or syncope symptoms  vestibular rehab spring 2014 - "did nothing"  Past Medical History  Diagnosis Date  . Arthritis   . Diabetes mellitus   . Heart murmur   . Hypertension   . Hypothyroid   . Prostate cancer 1998 dx  . MDS (myelodysplastic syndrome) 12/2006     hyperplastic on bone marrow bx 2008    Review of Systems  Constitutional: Negative for fever, fatigue and unexpected weight change.  Respiratory: Negative for cough and shortness of breath.   Cardiovascular: Negative for chest pain and leg swelling.  Neurological: Negative for dizziness and light-headedness.        Objective:   Physical Exam BP 118/62  Pulse 73  Temp(Src) 97.1 F (36.2 C) (Oral)  Wt 153 lb 1.9 oz (69.455 kg)  SpO2 99% Wt Readings from Last 3 Encounters:  02/25/13 153 lb 1.9 oz (69.455 kg)  02/15/13 153 lb 10.6 oz (69.7 kg)  11/17/12 156 lb 6.4 oz (70.943 kg)   Constitutional:  He appears younger than stated age, well-developed and well-nourished. No distress. Daughter at side Neck: Normal range of motion. Neck supple. No JVD or LAD present. No thyromegaly present.  Cardiovascular: Normal rate, regular rhythm and normal heart sounds.  2/6 syst  murmur heard. trace, chronic BLE edema at feet/ankles Pulmonary/Chest: Effort normal and breath sounds normal. No respiratory distress. no wheezes.  Musculoskeletal: arthritic changes. S/p L TKR -Normal range of motion. Patient exhibits no gross deformity. Gait and balance appears normal today Neurological: he is alert and oriented to person, place, and time. No cranial nerve deficit. Coordination, speech and balance are normal.  Psychiatric: he has a normal mood and affect. behavior is normal. Judgment and thought content normal.   Lab Results  Component Value Date   WBC 2.8* 02/16/2013   HGB 9.8* 02/16/2013   HCT 28.2* 02/16/2013   PLT 120* 02/16/2013   GLUCOSE 118* 02/16/2013   CHOL 149 11/17/2012   TRIG 227.0* 11/17/2012   HDL 43.50 11/17/2012   LDLDIRECT 89.1 11/17/2012   LDLCALC 74 09/17/2011   ALT 6 02/16/2013   AST 11 02/16/2013   NA 136 02/16/2013   K 4.5 02/16/2013   CL 103 02/16/2013   CREATININE 1.13 02/16/2013   BUN 21 02/16/2013   CO2 24 02/16/2013   TSH 0.646 02/16/2013   HGBA1C 6.3* 02/15/2013       Assessment & Plan:   See problem list. Medications and labs reviewed today.  Time spent with pt/family today 30 minutes, greater than 50% time spent counseling patient on hosp for SOB, pancytopenia and progressive MDS, diabetes, and medication review. Also review of prior records

## 2013-02-25 NOTE — Progress Notes (Signed)
Pre-visit discussion using our clinic review tool. No additional management support is needed unless otherwise documented below in the visit note.  

## 2013-02-25 NOTE — Assessment & Plan Note (Signed)
Check TSH, adjust as needed Lab Results  Component Value Date   TSH 0.646 02/16/2013

## 2013-02-25 NOTE — Assessment & Plan Note (Signed)
Hospitalization 02/2013 with progression of pancytopenia previously stable values, no treatment changes recommended Check CBC now and as needed follow up heme Mon 11/24 as planned - prev Gaylyn Rong, now Gorsuch CBC    Component Value Date/Time   WBC 2.8* 02/16/2013 0150   WBC 3.1 04/10/2011   WBC 2.9* 11/21/2010 1353   RBC 3.55* 02/16/2013 1245   RBC 3.33* 02/16/2013 0150   RBC 3.52* 11/21/2010 1353   HGB 9.8* 02/16/2013 0150   HGB 10.9* 11/21/2010 1353   HCT 28.2* 02/16/2013 0150   HCT 30.3* 11/21/2010 1353   PLT 120* 02/16/2013 0150   PLT Clumped Platelets--Appears Adequate 11/21/2010 1353   MCV 84.7 02/16/2013 0150   MCV 86.1 11/21/2010 1353   MCH 29.4 02/16/2013 0150   MCH 30.9 11/21/2010 1353   MCHC 34.8 02/16/2013 0150   MCHC 35.9 11/21/2010 1353   RDW 13.0 02/16/2013 0150   RDW 13.9 11/21/2010 1353   LYMPHSABS 1.3 11/17/2012 0945   LYMPHSABS 1.2 11/21/2010 1353   MONOABS 0.3 11/17/2012 0945   MONOABS 0.3 11/21/2010 1353   EOSABS 0.1 11/17/2012 0945   EOSABS 0.0 11/21/2010 1353   BASOSABS 0.0 11/17/2012 0945   BASOSABS 0.0 11/21/2010 1353

## 2013-03-01 ENCOUNTER — Telehealth: Payer: Self-pay | Admitting: Hematology and Oncology

## 2013-03-01 ENCOUNTER — Encounter: Payer: Self-pay | Admitting: Hematology and Oncology

## 2013-03-01 ENCOUNTER — Ambulatory Visit (HOSPITAL_BASED_OUTPATIENT_CLINIC_OR_DEPARTMENT_OTHER): Payer: Medicare Other | Admitting: Hematology and Oncology

## 2013-03-01 VITALS — BP 99/69 | HR 81 | Temp 98.0°F | Resp 18 | Ht 69.0 in | Wt 151.5 lb

## 2013-03-01 DIAGNOSIS — Z8546 Personal history of malignant neoplasm of prostate: Secondary | ICD-10-CM

## 2013-03-01 DIAGNOSIS — D649 Anemia, unspecified: Secondary | ICD-10-CM

## 2013-03-01 DIAGNOSIS — D72819 Decreased white blood cell count, unspecified: Secondary | ICD-10-CM

## 2013-03-01 DIAGNOSIS — C61 Malignant neoplasm of prostate: Secondary | ICD-10-CM

## 2013-03-01 DIAGNOSIS — D469 Myelodysplastic syndrome, unspecified: Secondary | ICD-10-CM

## 2013-03-01 NOTE — Telephone Encounter (Signed)
Gave pt appt for lab and MD on December 2014 °

## 2013-03-01 NOTE — Progress Notes (Signed)
[facility] OFFICE PROGRESS NOTE  Patient Care Team: [name], MD as PCP - General (Internal Medicine) Antony Haste, MD (Urology) Artis Delay, MD (Hematology and Oncology)  DIAGNOSIS: Leukopenia, anemia, history of prostate cancer, possible low-grade myelodysplastic syndrome  SUMMARY OF ONCOLOGIC HISTORY: This is a pleasant 79 year old gentleman accompanied by his family member. The patient was last seen here almost 3 years ago. In 2008, he had bone marrow aspirate and biopsy which revealed hypercellular bone marrow, concern for possible low-grade myelodysplastic syndrome. He was placed on observation. There was also a mention of possible diagnosis of prostate cancer although the patient cannot remember the details. He follows with the urologist.  INTERVAL HISTORY: [name] [name] 79 y.o. male returns for abnormal CBC. He was in the hospital recently for shortness of breath. His symptoms subsequently resolved. The patient also was found to have evidence of anemia. GI was consulted. He remember blood transfusion many years ago. His shortness of breath has improved. Denies any chest pain, headaches, or leg cramps. The patient denies any recent signs or symptoms of bleeding such as spontaneous epistaxis, hematuria or hematochezia. The patient complained of regular sweating. His sweating is non-drenching in nature. His great appetite. Denies any recent weight loss. He has intermittent constipation.  I have reviewed the past medical history, past surgical history, social history and family history with the patient and they are unchanged from previous note.  ALLERGIES:  is allergic to statins.  MEDICATIONS:  Current Outpatient Prescriptions  Medication Sig Dispense Refill  . diltiazem (CARDIZEM) 120 MG tablet Take 1 tablet (120 mg total) by mouth daily.  90 tablet  1  . levothyroxine (SYNTHROID, LEVOTHROID) 125 MCG tablet Take 1 tablet (125 mcg total) by  mouth daily.  90 tablet  1  . metFORMIN (GLUCOPHAGE) 500 MG tablet Take 1 tablet (500 mg total) by mouth daily with breakfast.  90 tablet  1  . OVER THE COUNTER MEDICATION 4 capsules at bedtime. "Stool softner" Takes 4 capsules every night.       No current facility-administered medications for this visit.    REVIEW OF SYSTEMS:   Eyes: Denies blurriness of vision Ears, nose, mouth, throat, and face: Denies mucositis or sore throat Respiratory: Denies cough, dyspnea or wheezes Cardiovascular: Denies palpitation, chest discomfort or lower extremity swelling Skin: Denies abnormal skin rashes Lymphatics: Denies new lymphadenopathy or easy bruising Neurological:Denies numbness, tingling or new weaknesses Behavioral/Psych: Mood is stable, no new changes  All other systems were reviewed with the patient and are negative.  PHYSICAL EXAMINATION: ECOG PERFORMANCE STATUS: 1 - Symptomatic but completely ambulatory  Filed Vitals:   03/01/13 1223  BP: 99/69  Pulse: 81  Temp: 98 F (36.7 C)  Resp: 18   Filed Weights   03/01/13 1223  Weight: 151 lb 8 oz (68.72 kg)    GENERAL:alert, no distress and comfortable. The patient is elderly. He is thin but not mild nourished SKIN: skin color, texture, turgor are normal, no rashes or significant lesions EYES: normal, Conjunctiva are pink and non-injected, sclera clear OROPHARYNX:no exudate, no erythema and lips, buccal mucosa, and tongue normal  NECK: supple, thyroid normal size, non-tender, without nodularity LYMPH:  no palpable lymphadenopathy in the cervical, axillary or inguinal LUNGS: clear to auscultation and percussion with normal breathing effort HEART: regular rate & rhythm and no murmurs and no lower extremity edema ABDOMEN:abdomen soft, non-tender and normal bowel sounds Musculoskeletal:no cyanosis of digits and no clubbing  NEURO: alert & oriented  x 3 with fluent speech, no focal motor/sensory deficits  LABORATORY DATA:  I have  reviewed the data as listed    Component Value Date/Time   NA 136 02/16/2013 0150   NA 138 04/10/2011   K 4.5 02/16/2013 0150   CL 103 02/16/2013 0150   CO2 24 02/16/2013 0150   GLUCOSE 118* 02/16/2013 0150   BUN 21 02/16/2013 0150   BUN 20 04/10/2011   CREATININE 1.13 02/16/2013 0150   CREATININE 1.0 04/10/2011   CALCIUM 8.9 02/16/2013 0150   PROT 6.0 02/16/2013 0150   ALBUMIN 3.2* 02/16/2013 0150   AST 11 02/16/2013 0150   ALT 6 02/16/2013 0150   ALKPHOS 81 02/16/2013 0150   BILITOT 0.3 02/16/2013 0150   GFRNONAA 54* 02/16/2013 0150   GFRAA 63* 02/16/2013 0150    No results found for this basename: SPEP, UPEP,  kappa and lambda light chains    Lab Results  Component Value Date   WBC 4.6 02/25/2013   NEUTROABS 2.4 02/25/2013   HGB 11.8* 02/25/2013   HCT 34.1* 02/25/2013   MCV 85.3 02/25/2013   PLT 171.0 02/25/2013      Chemistry      Component Value Date/Time   NA 136 02/16/2013 0150   NA 138 04/10/2011   K 4.5 02/16/2013 0150   CL 103 02/16/2013 0150   CO2 24 02/16/2013 0150   BUN 21 02/16/2013 0150   BUN 20 04/10/2011   CREATININE 1.13 02/16/2013 0150   CREATININE 1.0 04/10/2011   GLU 116 04/10/2011      Component Value Date/Time   CALCIUM 8.9 02/16/2013 0150   ALKPHOS 81 02/16/2013 0150   AST 11 02/16/2013 0150   ALT 6 02/16/2013 0150   BILITOT 0.3 02/16/2013 0150     ASSESSMENT & PLAN:  #1 myelodysplastic syndrome The patient had a diagnosis of myelodysplastic syndrome. In my eyes, this is inconclusive. In any case, with his age and relative lack of symptoms, I would not recommend repeat bone marrow aspirate and biopsy. We'll continue to follow him closely. #2 chronic leukopenia His most recent white count were within normal limits. He does have chronic leukopenia. The patient does not have history of recurrent infection. We will observe for now. #3 anemia This is likely anemia of chronic disease. The patient denies recent history of bleeding such as  epistaxis, hematuria or hematochezia. He is asymptomatic from the anemia. We will observe for now.  He does not require transfusion now.  I will order an additional workup for this. I suspect there is a component of iron deficiency. His most recent ferritin level was less than 50. I agree with GI evaluation as an outpatient. Also recommend he start to take 1 oral iron supplement a day and I will recheck his iron studies in his next visit. #4 history of thrombocytopenia This tend to fluctuate and platelet clumping was observed in his most recent lab work. I recommend observation only. #5 history of prostate cancer I do not have any records about this I will recheck his PSA with his next visit. #6 regular sweats I'm not sure whether this constitutes potential hot flashes or night sweats related to underlying malignancy. This is not severe. We will observe for now. Orders Placed This Encounter  Procedures  . Comprehensive metabolic panel    Standing Status: Future     Number of Occurrences:      Standing Expiration Date: 03/01/2014  . CBC & Diff and Retic  Standing Status: Future     Number of Occurrences:      Standing Expiration Date: 03/01/2014  . Ferritin    Standing Status: Future     Number of Occurrences:      Standing Expiration Date: 03/01/2014  . Iron and TIBC    Standing Status: Future     Number of Occurrences:      Standing Expiration Date: 03/01/2014  . Erythropoietin    Standing Status: Future     Number of Occurrences:      Standing Expiration Date: 03/01/2014  . PSA    Standing Status: Future     Number of Occurrences:      Standing Expiration Date: 03/01/2014  . Smear    Standing Status: Future     Number of Occurrences:      Standing Expiration Date: 03/01/2014   All questions were answered. The patient knows to call the clinic with any problems, questions or concerns. No barriers to learning was detected. I spent 25 minutes counseling the patient face to  face. The total time spent in the appointment was 40 minutes and more than 50% was on counseling and review of test results     [facility], Keiji Melland, MD 03/01/2013 1:03 PM

## 2013-03-31 ENCOUNTER — Encounter: Payer: Self-pay | Admitting: Hematology and Oncology

## 2013-03-31 ENCOUNTER — Telehealth: Payer: Self-pay | Admitting: Hematology and Oncology

## 2013-03-31 ENCOUNTER — Ambulatory Visit (HOSPITAL_BASED_OUTPATIENT_CLINIC_OR_DEPARTMENT_OTHER): Payer: Medicare Other | Admitting: Hematology and Oncology

## 2013-03-31 ENCOUNTER — Other Ambulatory Visit (HOSPITAL_BASED_OUTPATIENT_CLINIC_OR_DEPARTMENT_OTHER): Payer: Medicare Other

## 2013-03-31 VITALS — BP 144/67 | HR 78 | Temp 97.3°F | Resp 17 | Ht 69.0 in | Wt 153.7 lb

## 2013-03-31 DIAGNOSIS — D469 Myelodysplastic syndrome, unspecified: Secondary | ICD-10-CM

## 2013-03-31 DIAGNOSIS — C61 Malignant neoplasm of prostate: Secondary | ICD-10-CM

## 2013-03-31 DIAGNOSIS — D649 Anemia, unspecified: Secondary | ICD-10-CM

## 2013-03-31 DIAGNOSIS — D72819 Decreased white blood cell count, unspecified: Secondary | ICD-10-CM

## 2013-03-31 DIAGNOSIS — Z8546 Personal history of malignant neoplasm of prostate: Secondary | ICD-10-CM

## 2013-03-31 LAB — CBC & DIFF AND RETIC
BASO%: 0.3 % (ref 0.0–2.0)
EOS%: 2 % (ref 0.0–7.0)
HCT: 34.3 % — ABNORMAL LOW (ref 38.4–49.9)
MCH: 28.8 pg (ref 27.2–33.4)
MCHC: 34.1 g/dL (ref 32.0–36.0)
MCV: 84.5 fL (ref 79.3–98.0)
MONO%: 7.5 % (ref 0.0–14.0)
NEUT%: 46.7 % (ref 39.0–75.0)
Platelets: ADEQUATE 10*3/uL (ref 140–400)
RDW: 13.8 % (ref 11.0–14.6)
Retic %: 1.5 % (ref 0.80–1.80)
Retic Ct Abs: 60.9 10*3/uL (ref 34.80–93.90)
lymph#: 1.3 10*3/uL (ref 0.9–3.3)

## 2013-03-31 LAB — PSA: PSA: 0.17 ng/mL (ref <=4.00)

## 2013-03-31 LAB — IRON AND TIBC CHCC
%SAT: 42 % (ref 20–55)
Iron: 144 ug/dL (ref 42–163)
TIBC: 344 ug/dL (ref 202–409)

## 2013-03-31 LAB — ERYTHROPOIETIN: Erythropoietin: 21.8 m[IU]/mL — ABNORMAL HIGH (ref 2.6–18.5)

## 2013-03-31 LAB — COMPREHENSIVE METABOLIC PANEL (CC13)
ALT: 6 U/L (ref 0–55)
AST: 14 U/L (ref 5–34)
Albumin: 3.9 g/dL (ref 3.5–5.0)
Alkaline Phosphatase: 77 U/L (ref 40–150)
Anion Gap: 11 mEq/L (ref 3–11)
BUN: 19.5 mg/dL (ref 7.0–26.0)
CO2: 23 mEq/L (ref 22–29)
Calcium: 9.3 mg/dL (ref 8.4–10.4)
Chloride: 107 mEq/L (ref 98–109)
Potassium: 4.1 mEq/L (ref 3.5–5.1)
Sodium: 141 mEq/L (ref 136–145)
Total Protein: 7.1 g/dL (ref 6.4–8.3)

## 2013-03-31 LAB — CHCC SMEAR

## 2013-03-31 NOTE — Progress Notes (Signed)
[facility] OFFICE PROGRESS NOTE  [name] Paci, MD DIAGNOSIS:  History of leukopenia, anemia, history of prostate cancer  SUMMARY OF HEMATOLOGIC HISTORY: This is a pleasant 79 year old gentleman accompanied by his family member. The patient was last seen here almost 3 years ago. In 2008, he had bone marrow aspirate and biopsy which revealed hypercellular bone marrow, concern for possible low-grade myelodysplastic syndrome. He was placed on observation. There was also a mention of possible diagnosis of prostate cancer although the patient cannot remember the details. He follows with the urologist. INTERVAL HISTORY: [name] [name] 79 y.o. male returns for followup. His doing well. The patient denies any recent signs or symptoms of bleeding such as spontaneous epistaxis, hematuria or hematochezia. He has occasional sweats. Denies any recent infection.  I have reviewed the past medical history, past surgical history, social history and family history with the patient and they are unchanged from previous note.  ALLERGIES:  is allergic to statins.  MEDICATIONS:  Current Outpatient Prescriptions  Medication Sig Dispense Refill  . diltiazem (CARDIZEM) 120 MG tablet Take 1 tablet (120 mg total) by mouth daily.  90 tablet  1  . levothyroxine (SYNTHROID, LEVOTHROID) 125 MCG tablet Take 1 tablet (125 mcg total) by mouth daily.  90 tablet  1  . metFORMIN (GLUCOPHAGE) 500 MG tablet Take 1 tablet (500 mg total) by mouth daily with breakfast.  90 tablet  1  . psyllium (METAMUCIL) 58.6 % packet Take 1 packet by mouth daily.       No current facility-administered medications for this visit.     REVIEW OF SYSTEMS:   Eyes: Denies blurriness of vision Ears, nose, mouth, throat, and face: Denies mucositis or sore throat Respiratory: Denies cough, dyspnea or wheezes Cardiovascular: Denies palpitation, chest discomfort or lower extremity swelling Gastrointestinal:  Denies nausea,  heartburn or change in bowel habits Skin: Denies abnormal skin rashes Lymphatics: Denies new lymphadenopathy or easy bruising Neurological:Denies numbness, tingling or new weaknesses Behavioral/Psych: Mood is stable, no new changes  All other systems were reviewed with the patient and are negative.  PHYSICAL EXAMINATION: ECOG PERFORMANCE STATUS: 0 - Asymptomatic  Filed Vitals:   03/31/13 0925  BP: 144/67  Pulse: 78  Temp: 97.3 F (36.3 C)  Resp: 17   Filed Weights   03/31/13 0925  Weight: 153 lb 11.2 oz (69.718 kg)    GENERAL:alert, no distress and comfortable SKIN: skin color, texture, turgor are normal, no rashes or significant lesions EYES: normal, Conjunctiva are pink and non-injected, sclera clear OROPHARYNX:no exudate, no erythema and lips, buccal mucosa, and tongue normal  NECK: supple, thyroid normal size, non-tender, without nodularity LYMPH:  no palpable lymphadenopathy in the cervical, axillary or inguinal LUNGS: clear to auscultation and percussion with normal breathing effort HEART: regular rate & rhythm and no murmurs and no lower extremity edema ABDOMEN:abdomen soft, non-tender and normal bowel sounds Musculoskeletal:no cyanosis of digits and no clubbing  NEURO: alert & oriented x 3 with fluent speech, no focal motor/sensory deficits  LABORATORY DATA:  I have reviewed the data as listed Results for orders placed in visit on 03/31/13 (from the past 48 hour(s))  CBC & DIFF AND RETIC     Status: Abnormal   Collection Time    03/31/13  9:13 AM      Result Value Range   WBC 3.1 (*) 4.0 - 10.3 10e3/uL   NEUT# 1.4 (*) 1.5 - 6.5 10e3/uL   HGB 11.7 (*) 13.0 - 17.1 g/dL  HCT 34.3 (*) 38.4 - 49.9 %   Platelets Clumped Platelets--Appears Adequate  140 - 400 10e3/uL   MCV 84.5  79.3 - 98.0 fL   MCH 28.8  27.2 - 33.4 pg   MCHC 34.1  32.0 - 36.0 g/dL   RBC 1.61 (*) 0.96 - 0.45 10e6/uL   RDW 13.8  11.0 - 14.6 %   lymph# 1.3  0.9 - 3.3 10e3/uL   MONO# 0.2  0.1 -  0.9 10e3/uL   Eosinophils Absolute 0.1  0.0 - 0.5 10e3/uL   Basophils Absolute 0.0  0.0 - 0.1 10e3/uL   NEUT% 46.7  39.0 - 75.0 %   LYMPH% 43.5  14.0 - 49.0 %   MONO% 7.5  0.0 - 14.0 %   EOS% 2.0  0.0 - 7.0 %   BASO% 0.3  0.0 - 2.0 %   nRBC 0  0 - 0 %   Retic % 1.50  0.80 - 1.80 %   Retic Ct Abs 60.90  34.80 - 93.90 10e3/uL   Immature Retic Fract 6.70  3.00 - 10.60 %  [facility] SMEAR     Status: None   Collection Time    03/31/13  9:13 AM      Result Value Range   Smear Result Smear Available      Lab Results  Component Value Date   WBC 3.1* 03/31/2013   HGB 11.7* 03/31/2013   HCT 34.3* 03/31/2013   MCV 84.5 03/31/2013   PLT Clumped Platelets--Appears Adequate 03/31/2013   I reviewed his peripheral smear and agreed that clumping of platelets were noted  ASSESSMENT & PLAN:  #1 questionable myelodysplastic syndrome The patient had a diagnosis of myelodysplastic syndrome. In my eyes, this is inconclusive. In any case, with his age and relative lack of symptoms, I would not recommend repeat bone marrow aspirate and biopsy. We'll continue to follow him closely. #2 chronic leukopenia His white blood cell count fluctuates. The patient does not have history of recurrent infection. We will observe for now. #3 anemia This is likely anemia of chronic disease. The patient denies recent history of bleeding such as epistaxis, hematuria or hematochezia. He is asymptomatic from the anemia. We will observe for now.  He does not require transfusion now.  #4 history of thrombocytopenia This tend to fluctuate and platelet clumping was observed in his blood smear today. I recommend observation only. #5 history of prostate cancer I do not have any records about this. PSA is pending. #6 regular sweats I'm not sure whether this constitutes potential hot flashes or night sweats related to underlying malignancy. This is not severe. We will observe for now. All questions were answered. The patient knows to  call the clinic with any problems, questions or concerns. No barriers to learning was detected.   [facility], Allani Reber, MD 03/31/2013 9:42 AM

## 2013-03-31 NOTE — Telephone Encounter (Signed)
gv adn printed appt sched and avs for pt for June 2015  °

## 2013-05-26 ENCOUNTER — Ambulatory Visit: Payer: Medicare Other | Admitting: Internal Medicine

## 2013-05-26 ENCOUNTER — Other Ambulatory Visit (INDEPENDENT_AMBULATORY_CARE_PROVIDER_SITE_OTHER): Payer: Medicare Other

## 2013-05-26 ENCOUNTER — Encounter: Payer: Self-pay | Admitting: Internal Medicine

## 2013-05-26 ENCOUNTER — Ambulatory Visit (INDEPENDENT_AMBULATORY_CARE_PROVIDER_SITE_OTHER): Payer: Medicare Other | Admitting: Internal Medicine

## 2013-05-26 VITALS — BP 132/68 | HR 61 | Temp 98.4°F | Wt 157.0 lb

## 2013-05-26 DIAGNOSIS — D469 Myelodysplastic syndrome, unspecified: Secondary | ICD-10-CM

## 2013-05-26 DIAGNOSIS — K5909 Other constipation: Secondary | ICD-10-CM

## 2013-05-26 DIAGNOSIS — K5900 Constipation, unspecified: Secondary | ICD-10-CM

## 2013-05-26 DIAGNOSIS — E039 Hypothyroidism, unspecified: Secondary | ICD-10-CM

## 2013-05-26 DIAGNOSIS — E1149 Type 2 diabetes mellitus with other diabetic neurological complication: Secondary | ICD-10-CM

## 2013-05-26 LAB — MICROALBUMIN / CREATININE URINE RATIO
CREATININE, U: 119.7 mg/dL
MICROALB/CREAT RATIO: 24.2 mg/g (ref 0.0–30.0)
Microalb, Ur: 29 mg/dL — ABNORMAL HIGH (ref 0.0–1.9)

## 2013-05-26 LAB — HEMOGLOBIN A1C: HEMOGLOBIN A1C: 6.3 % (ref 4.6–6.5)

## 2013-05-26 MED ORDER — POLYETHYLENE GLYCOL 3350 17 GM/SCOOP PO POWD: 17.0000 g | Freq: Two times a day (BID) | ORAL | Status: DC

## 2013-05-26 MED ORDER — DOCUSATE SODIUM 100 MG PO CAPS: 100.0000 mg | ORAL_CAPSULE | Freq: Every day | ORAL | Status: DC | PRN

## 2013-05-26 NOTE — Assessment & Plan Note (Signed)
advised over-the-counter MiraLAX twice daily with Duclox as needed

## 2013-05-26 NOTE — Progress Notes (Signed)
Subjective:    Patient ID: [name] [name], male    DOB: 10/29/1917, 80 y.o.   MRN: [id number]  HPI  Patient here for follow up Reviewed chronic medical issues and interval medical events  Past Medical History  Diagnosis Date  . Arthritis   . Diabetes mellitus   . Heart murmur   . Hypertension   . Hypothyroid   . Prostate cancer 1998 dx  . MDS (myelodysplastic syndrome) 12/2006     hyperplastic on bone marrow bx 2008    Review of Systems  Constitutional: Negative for fatigue and unexpected weight change.  Respiratory: Negative for cough and shortness of breath.   Gastrointestinal: Positive for constipation (chronic).  Endocrine: Positive for cold intolerance.       Objective:   Physical Exam  BP 132/68  Pulse 61  Temp(Src) 98.4 F (36.9 C) (Oral)  Wt 157 lb (71.215 kg)  SpO2 98% Wt Readings from Last 3 Encounters:  05/26/13 157 lb (71.215 kg)  03/31/13 153 lb 11.2 oz (69.718 kg)  03/01/13 151 lb 8 oz (68.72 kg)    Constitutional: he appears well-developed and well-nourished. No distress. dtr at side - HOH without change Neck: Normal range of motion. Neck supple. No JVD present. No thyromegaly present.  Cardiovascular: Normal rate, regular rhythm and normal heart sounds.  No murmur heard. No BLE edema. Pulmonary/Chest: Effort normal and breath sounds normal. No respiratory distress. he has no wheezes.  Psychiatric: he has a normal mood and affect. His behavior is normal. Judgment and thought content normal.   Lab Results  Component Value Date   WBC 3.1* 03/31/2013   HGB 11.7* 03/31/2013   HCT 34.3* 03/31/2013   PLT Clumped Platelets--Appears Adequate 03/31/2013   GLUCOSE 161* 03/31/2013   CHOL 149 11/17/2012   TRIG 227.0* 11/17/2012   HDL 43.50 11/17/2012   LDLDIRECT 89.1 11/17/2012   LDLCALC 74 09/17/2011   ALT 6 03/31/2013   AST 14 03/31/2013   NA 141 03/31/2013   K 4.1 03/31/2013   CL 103 02/16/2013   CREATININE 1.0 03/31/2013   BUN 19.5 03/31/2013   CO2 23 03/31/2013   TSH 0.646 02/16/2013   PSA 0.17 03/31/2013   HGBA1C 6.3* 02/15/2013    Dg Chest 2 View  02/15/2013   CLINICAL DATA:  Shortness of breath.  EXAM: CHEST  2 VIEW  COMPARISON:  January 14, 2012.  FINDINGS: The lungs are adequately inflated. There is subtle stable nodularity in the left mid lung likely related to the anterior aspect of the 4th rib. . The cardiac silhouette is normal in size. The pulmonary vascularity is not engorged. There is no pneumothorax or significant pleural effusion. There is minimal stable blunting of the posterior costophrenic angles bilaterally. There are degenerative changes of the right shoulder.  IMPRESSION: 1. There is no evidence of pneumonia nor CHF or other acute cardiopulmonary abnormality. 2. There is stable subtle nodularity likely in the anterior aspect of the left 4th rib.   Electronically Signed   By: David  Martinique   On: 02/15/2013 16:11       Assessment & Plan:   The primary encounter diagnosis was Type II or unspecified type diabetes mellitus with neurological manifestations, not stated as uncontrolled(250.60). Diagnoses of MDS (myelodysplastic syndrome), Hypothyroid, and Chronic constipation were also pertinent to this visit.  Problem List Items Addressed This Visit   Chronic constipation     advised over-the-counter MiraLAX twice daily with Duclox as needed    Relevant Medications  POLYETHYLENE GLYCOL 3350 17 GM PO POWD      docusate sodium (COLACE) capsule   Hypothyroid      Check TSH q6-81mo, adjust as needed Lab Results  Component Value Date   TSH 0.646 02/16/2013      MDS (myelodysplastic syndrome)      Hospitalization 02/2013 with progression of pancytopenia previously stable values, no treatment changes recommended Check CBC now and q3-43mo - wil follow here and then heme as needed if changes Last OV reviewed (03/2013) CBC    Component Value Date/Time   WBC 3.1* 03/31/2013 0913   WBC 4.6 02/25/2013 1239   WBC  3.1 04/10/2011   RBC 4.06* 03/31/2013 0913   RBC 3.99* 02/25/2013 1239   RBC 3.55* 02/16/2013 1245   HGB 11.7* 03/31/2013 0913   HGB 11.8* 02/25/2013 1239   HCT 34.3* 03/31/2013 0913   HCT 34.1* 02/25/2013 1239   PLT Clumped Platelets--Appears Adequate 03/31/2013 0913   PLT 171.0 02/25/2013 1239   MCV 84.5 03/31/2013 0913   MCV 85.3 02/25/2013 1239   MCH 28.8 03/31/2013 0913   MCH 29.4 02/16/2013 0150   MCHC 34.1 03/31/2013 0913   MCHC 34.5 02/25/2013 1239   RDW 13.8 03/31/2013 0913   RDW 13.9 02/25/2013 1239   LYMPHSABS 1.3 03/31/2013 0913   LYMPHSABS 1.7 02/25/2013 1239   MONOABS 0.2 03/31/2013 0913   MONOABS 0.5 02/25/2013 1239   EOSABS 0.1 03/31/2013 0913   EOSABS 0.1 02/25/2013 1239   BASOSABS 0.0 03/31/2013 0913   BASOSABS 0.0 02/25/2013 1239       Type II or unspecified type diabetes mellitus with neurological manifestations, not stated as uncontrolled(250.60) - Primary      Metformin solo tx - Not on statin or ACEI recommended ASA 81 qd reviewed a1c and lipids - check q6-87mo adjust tx as needed but will balance risk/benefit of tx with adv age Lab Results  Component Value Date   HGBA1C 6.3* 02/15/2013      Relevant Orders      Hemoglobin A1c (Completed)      Microalbumin / creatinine urine ratio (Completed)

## 2013-05-26 NOTE — Patient Instructions (Addendum)
It was good to see you today.  We have reviewed your prior records including labs and tests today  Medications reviewed and updated -no recommended changes at this time. Take MiraLAX twice daily every day and use this 8 at night as needed for constipation  Test(s) ordered today. Your results will be released to [place] (or called to you) after review, usually within 72hours after test completion. If any changes need to be made, you will be notified at that same time.  Please schedule followup in 4-6 months for diabetes mellitus check and blood count (CBC) check, call sooner if problems.  Constipation, Adult Constipation is when a person has fewer than 3 bowel movements a week; has difficulty having a bowel movement; or has stools that are dry, hard, or larger than normal. As people grow older, constipation is more common. If you try to fix constipation with medicines that make you have a bowel movement (laxatives), the problem may get worse. Long-term laxative use may cause the muscles of the colon to become weak. A low-fiber diet, not taking in enough fluids, and taking certain medicines may make constipation worse. CAUSES   Certain medicines, such as antidepressants, pain medicine, iron supplements, antacids, and water pills.   Certain diseases, such as diabetes, irritable bowel syndrome (IBS), thyroid disease, or depression.   Not drinking enough water.   Not eating enough fiber-rich foods.   Stress or travel.  Lack of physical activity or exercise.  Not going to the restroom when there is the urge to have a bowel movement.  Ignoring the urge to have a bowel movement.  Using laxatives too much. SYMPTOMS   Having fewer than 3 bowel movements a week.   Straining to have a bowel movement.   Having hard, dry, or larger than normal stools.   Feeling full or bloated.   Pain in the lower abdomen.  Not feeling relief after having a bowel movement. DIAGNOSIS  Your  caregiver will take a medical history and perform a physical exam. Further testing may be done for severe constipation. Some tests may include:   A barium enema X-ray to examine your rectum, colon, and sometimes, your small intestine.  A sigmoidoscopy to examine your lower colon.  A colonoscopy to examine your entire colon. TREATMENT  Treatment will depend on the severity of your constipation and what is causing it. Some dietary treatments include drinking more fluids and eating more fiber-rich foods. Lifestyle treatments may include regular exercise. If these diet and lifestyle recommendations do not help, your caregiver may recommend taking over-the-counter laxative medicines to help you have bowel movements. Prescription medicines may be prescribed if over-the-counter medicines do not work.  HOME CARE INSTRUCTIONS   Increase dietary fiber in your diet, such as fruits, vegetables, whole grains, and beans. Limit high-fat and processed sugars in your diet, such as [place] fries, hamburgers, cookies, candies, and soda.   A fiber supplement may be added to your diet if you cannot get enough fiber from foods.   Drink enough fluids to keep your urine clear or pale yellow.   Exercise regularly or as directed by your caregiver.   Go to the restroom when you have the urge to go. Do not hold it.  Only take medicines as directed by your caregiver. Do not take other medicines for constipation without talking to your caregiver first. [place] IF:   You have bright red blood in your stool.   Your constipation lasts for more than  4 days or gets worse.   You have abdominal or rectal pain.   You have thin, pencil-like stools.  You have unexplained weight loss. MAKE SURE YOU:   Understand these instructions.  Will watch your condition.  Will get help right away if you are not doing well or get worse. Document Released: 12/22/2003 Document Revised: 06/17/2011 Document  Reviewed: 01/04/2013 [facility] Patient Information 2014 [place], [place].

## 2013-05-26 NOTE — Assessment & Plan Note (Signed)
Metformin solo tx - Not on statin or ACEI recommended ASA 81 qd reviewed a1c and lipids - check q6-80mo adjust tx as needed but will balance risk/benefit of tx with adv age Lab Results  Component Value Date   HGBA1C 6.3* 02/15/2013

## 2013-05-26 NOTE — Assessment & Plan Note (Signed)
Check TSH q6-12mo, adjust as needed Lab Results  Component Value Date   TSH 0.646 02/16/2013   

## 2013-05-26 NOTE — Progress Notes (Signed)
Pre-visit discussion using our clinic review tool. No additional management support is needed unless otherwise documented below in the visit note.  

## 2013-05-26 NOTE — Assessment & Plan Note (Signed)
Hospitalization 02/2013 with progression of pancytopenia previously stable values, no treatment changes recommended Check CBC now and q3-30mo - wil follow here and then heme as needed if changes Last OV reviewed (03/2013) CBC    Component Value Date/Time   WBC 3.1* 03/31/2013 0913   WBC 4.6 02/25/2013 1239   WBC 3.1 04/10/2011   RBC 4.06* 03/31/2013 0913   RBC 3.99* 02/25/2013 1239   RBC 3.55* 02/16/2013 1245   HGB 11.7* 03/31/2013 0913   HGB 11.8* 02/25/2013 1239   HCT 34.3* 03/31/2013 0913   HCT 34.1* 02/25/2013 1239   PLT Clumped Platelets--Appears Adequate 03/31/2013 0913   PLT 171.0 02/25/2013 1239   MCV 84.5 03/31/2013 0913   MCV 85.3 02/25/2013 1239   MCH 28.8 03/31/2013 0913   MCH 29.4 02/16/2013 0150   MCHC 34.1 03/31/2013 0913   MCHC 34.5 02/25/2013 1239   RDW 13.8 03/31/2013 0913   RDW 13.9 02/25/2013 1239   LYMPHSABS 1.3 03/31/2013 0913   LYMPHSABS 1.7 02/25/2013 1239   MONOABS 0.2 03/31/2013 0913   MONOABS 0.5 02/25/2013 1239   EOSABS 0.1 03/31/2013 0913   EOSABS 0.1 02/25/2013 1239   BASOSABS 0.0 03/31/2013 0913   BASOSABS 0.0 02/25/2013 1239

## 2013-06-06 ENCOUNTER — Inpatient Hospital Stay (HOSPITAL_COMMUNITY): Payer: Medicare Other

## 2013-06-06 ENCOUNTER — Encounter (HOSPITAL_COMMUNITY): Payer: Self-pay | Admitting: Emergency Medicine

## 2013-06-06 ENCOUNTER — Emergency Department (HOSPITAL_COMMUNITY): Payer: Medicare Other

## 2013-06-06 ENCOUNTER — Inpatient Hospital Stay (HOSPITAL_COMMUNITY)
Admission: EM | Admit: 2013-06-06 | Discharge: 2013-06-09 | DRG: 689 | Disposition: A | Discharge status: Skilled Nursing Facility | Payer: Medicare Other | Attending: Internal Medicine | Admitting: Internal Medicine

## 2013-06-06 DIAGNOSIS — W010XXA Fall on same level from slipping, tripping and stumbling without subsequent striking against object, initial encounter: Secondary | ICD-10-CM | POA: Diagnosis present

## 2013-06-06 DIAGNOSIS — M129 Arthropathy, unspecified: Secondary | ICD-10-CM | POA: Diagnosis present

## 2013-06-06 DIAGNOSIS — E538 Deficiency of other specified B group vitamins: Secondary | ICD-10-CM

## 2013-06-06 DIAGNOSIS — D708 Other neutropenia: Secondary | ICD-10-CM | POA: Diagnosis present

## 2013-06-06 DIAGNOSIS — E1149 Type 2 diabetes mellitus with other diabetic neurological complication: Secondary | ICD-10-CM

## 2013-06-06 DIAGNOSIS — R270 Ataxia, unspecified: Secondary | ICD-10-CM

## 2013-06-06 DIAGNOSIS — Z96659 Presence of unspecified artificial knee joint: Secondary | ICD-10-CM

## 2013-06-06 DIAGNOSIS — R531 Weakness: Secondary | ICD-10-CM

## 2013-06-06 DIAGNOSIS — K5909 Other constipation: Secondary | ICD-10-CM

## 2013-06-06 DIAGNOSIS — I5181 Takotsubo syndrome: Secondary | ICD-10-CM | POA: Diagnosis present

## 2013-06-06 DIAGNOSIS — I4510 Unspecified right bundle-branch block: Secondary | ICD-10-CM | POA: Diagnosis present

## 2013-06-06 DIAGNOSIS — R509 Fever, unspecified: Secondary | ICD-10-CM

## 2013-06-06 DIAGNOSIS — D638 Anemia in other chronic diseases classified elsewhere: Secondary | ICD-10-CM | POA: Diagnosis present

## 2013-06-06 DIAGNOSIS — I214 Non-ST elevation (NSTEMI) myocardial infarction: Secondary | ICD-10-CM

## 2013-06-06 DIAGNOSIS — E039 Hypothyroidism, unspecified: Secondary | ICD-10-CM | POA: Diagnosis present

## 2013-06-06 DIAGNOSIS — I10 Essential (primary) hypertension: Secondary | ICD-10-CM

## 2013-06-06 DIAGNOSIS — Z889 Allergy status to unspecified drugs, medicaments and biological substances status: Secondary | ICD-10-CM

## 2013-06-06 DIAGNOSIS — Z66 Do not resuscitate: Secondary | ICD-10-CM | POA: Diagnosis present

## 2013-06-06 DIAGNOSIS — I248 Other forms of acute ischemic heart disease: Secondary | ICD-10-CM | POA: Diagnosis present

## 2013-06-06 DIAGNOSIS — N390 Urinary tract infection, site not specified: Secondary | ICD-10-CM | POA: Diagnosis present

## 2013-06-06 DIAGNOSIS — K5900 Constipation, unspecified: Secondary | ICD-10-CM

## 2013-06-06 DIAGNOSIS — N12 Tubulo-interstitial nephritis, not specified as acute or chronic: Principal | ICD-10-CM | POA: Diagnosis present

## 2013-06-06 DIAGNOSIS — B952 Enterococcus as the cause of diseases classified elsewhere: Secondary | ICD-10-CM | POA: Diagnosis present

## 2013-06-06 DIAGNOSIS — Z79899 Other long term (current) drug therapy: Secondary | ICD-10-CM

## 2013-06-06 DIAGNOSIS — D469 Myelodysplastic syndrome, unspecified: Secondary | ICD-10-CM

## 2013-06-06 DIAGNOSIS — Z8042 Family history of malignant neoplasm of prostate: Secondary | ICD-10-CM

## 2013-06-06 DIAGNOSIS — F419 Anxiety disorder, unspecified: Secondary | ICD-10-CM

## 2013-06-06 DIAGNOSIS — F329 Major depressive disorder, single episode, unspecified: Secondary | ICD-10-CM

## 2013-06-06 DIAGNOSIS — I2489 Other forms of acute ischemic heart disease: Secondary | ICD-10-CM | POA: Diagnosis present

## 2013-06-06 DIAGNOSIS — F32A Depression, unspecified: Secondary | ICD-10-CM

## 2013-06-06 DIAGNOSIS — F0390 Unspecified dementia without behavioral disturbance: Secondary | ICD-10-CM | POA: Diagnosis present

## 2013-06-06 DIAGNOSIS — Z8546 Personal history of malignant neoplasm of prostate: Secondary | ICD-10-CM

## 2013-06-06 DIAGNOSIS — R011 Cardiac murmur, unspecified: Secondary | ICD-10-CM | POA: Diagnosis present

## 2013-06-06 HISTORY — DX: Non-ST elevation (NSTEMI) myocardial infarction: I21.4

## 2013-06-06 LAB — TROPONIN I: Troponin I: 1.53 ng/mL (ref <0.30)

## 2013-06-06 LAB — URINALYSIS, ROUTINE W REFLEX MICROSCOPIC
Bilirubin Urine: NEGATIVE
GLUCOSE, UA: NEGATIVE mg/dL
Ketones, ur: NEGATIVE mg/dL
Nitrite: NEGATIVE
PH: 7 (ref 5.0–8.0)
Protein, ur: 30 mg/dL — AB
SPECIFIC GRAVITY, URINE: 1.019 (ref 1.005–1.030)
Urobilinogen, UA: 1 mg/dL (ref 0.0–1.0)

## 2013-06-06 LAB — URINE MICROSCOPIC-ADD ON

## 2013-06-06 LAB — CBC WITH DIFFERENTIAL/PLATELET
BASOS ABS: 0 10*3/uL (ref 0.0–0.1)
Basophils Relative: 0 % (ref 0–1)
EOS ABS: 0 10*3/uL (ref 0.0–0.7)
Eosinophils Relative: 0 % (ref 0–5)
HCT: 36.3 % — ABNORMAL LOW (ref 39.0–52.0)
Hemoglobin: 12.6 g/dL — ABNORMAL LOW (ref 13.0–17.0)
LYMPHS ABS: 1.5 10*3/uL (ref 0.7–4.0)
LYMPHS PCT: 20 % (ref 12–46)
MCH: 29.8 pg (ref 26.0–34.0)
MCHC: 34.7 g/dL (ref 30.0–36.0)
MCV: 85.8 fL (ref 78.0–100.0)
Monocytes Absolute: 0.6 10*3/uL (ref 0.1–1.0)
Monocytes Relative: 8 % (ref 3–12)
NEUTROS ABS: 5.3 10*3/uL (ref 1.7–7.7)
Neutrophils Relative %: 72 % (ref 43–77)
Platelets: 147 10*3/uL — ABNORMAL LOW (ref 150–400)
RBC: 4.23 MIL/uL (ref 4.22–5.81)
RDW: 13.5 % (ref 11.5–15.5)
WBC: 7.4 10*3/uL (ref 4.0–10.5)

## 2013-06-06 LAB — COMPREHENSIVE METABOLIC PANEL
ALBUMIN: 4.1 g/dL (ref 3.5–5.2)
ALT: 24 U/L (ref 0–53)
AST: 23 U/L (ref 0–37)
Alkaline Phosphatase: 83 U/L (ref 39–117)
BUN: 20 mg/dL (ref 6–23)
CALCIUM: 9.4 mg/dL (ref 8.4–10.5)
CHLORIDE: 95 meq/L — AB (ref 96–112)
CO2: 21 mEq/L (ref 19–32)
CREATININE: 0.96 mg/dL (ref 0.50–1.35)
GFR calc Af Amer: 80 mL/min — ABNORMAL LOW (ref >90)
GFR calc non Af Amer: 69 mL/min — ABNORMAL LOW (ref >90)
Glucose, Bld: 139 mg/dL — ABNORMAL HIGH (ref 70–99)
Potassium: 4 mEq/L (ref 3.7–5.3)
Sodium: 134 mEq/L — ABNORMAL LOW (ref 137–147)
TOTAL PROTEIN: 7.7 g/dL (ref 6.0–8.3)
Total Bilirubin: 0.6 mg/dL (ref 0.3–1.2)

## 2013-06-06 LAB — I-STAT CG4 LACTIC ACID, ED: LACTIC ACID, VENOUS: 1.43 mmol/L (ref 0.5–2.2)

## 2013-06-06 LAB — LIPASE, BLOOD: Lipase: 55 U/L (ref 11–59)

## 2013-06-06 LAB — CBG MONITORING, ED: GLUCOSE-CAPILLARY: 140 mg/dL — AB (ref 70–99)

## 2013-06-06 MED ORDER — ACETAMINOPHEN 650 MG RE SUPP: 650.0000 mg | Freq: Four times a day (QID) | RECTAL | Status: DC | PRN

## 2013-06-06 MED ORDER — ONDANSETRON HCL 4 MG/2ML IJ SOLN
4.0000 mg | Freq: Four times a day (QID) | INTRAMUSCULAR | Status: DC | PRN
  Administered 2013-06-07: 4 mg via INTRAVENOUS
  Filled 2013-06-06: qty 2

## 2013-06-06 MED ORDER — SODIUM CHLORIDE 0.9 % IV BOLUS (SEPSIS)
1000.0000 mL | Freq: Once | INTRAVENOUS | Status: AC
  Administered 2013-06-06: 1000 mL via INTRAVENOUS

## 2013-06-06 MED ORDER — DOCUSATE SODIUM 100 MG PO CAPS
100.0000 mg | ORAL_CAPSULE | Freq: Every day | ORAL | Status: DC | PRN
  Filled 2013-06-06: qty 1

## 2013-06-06 MED ORDER — DILTIAZEM HCL 60 MG PO TABS
120.0000 mg | ORAL_TABLET | Freq: Every day | ORAL | Status: DC
  Filled 2013-06-06 (×2): qty 2

## 2013-06-06 MED ORDER — DEXTROSE 5 % IV SOLN
1.0000 g | Freq: Once | INTRAVENOUS | Status: AC
  Administered 2013-06-06: 1 g via INTRAVENOUS
  Filled 2013-06-06: qty 10

## 2013-06-06 MED ORDER — SODIUM CHLORIDE 0.9 % IV SOLN
INTRAVENOUS | Status: AC
  Administered 2013-06-06 – 2013-06-07 (×2): via INTRAVENOUS

## 2013-06-06 MED ORDER — LEVOTHYROXINE SODIUM 125 MCG PO TABS
125.0000 ug | ORAL_TABLET | Freq: Every day | ORAL | Status: DC
  Administered 2013-06-07 – 2013-06-09 (×3): 125 ug via ORAL
  Filled 2013-06-06 (×6): qty 1

## 2013-06-06 MED ORDER — ASPIRIN EC 81 MG PO TBEC
81.0000 mg | DELAYED_RELEASE_TABLET | Freq: Every day | ORAL | Status: DC
  Administered 2013-06-07 – 2013-06-09 (×3): 81 mg via ORAL
  Filled 2013-06-06 (×6): qty 1

## 2013-06-06 MED ORDER — POLYETHYLENE GLYCOL 3350 17 GM/SCOOP PO POWD
17.0000 g | Freq: Two times a day (BID) | ORAL | Status: DC
  Filled 2013-06-06: qty 255

## 2013-06-06 MED ORDER — SODIUM CHLORIDE 0.9 % IJ SOLN
3.0000 mL | Freq: Two times a day (BID) | INTRAMUSCULAR | Status: DC
  Administered 2013-06-06 – 2013-06-09 (×4): 3 mL via INTRAVENOUS

## 2013-06-06 MED ORDER — POLYETHYLENE GLYCOL 3350 17 G PO PACK
17.0000 g | PACK | Freq: Two times a day (BID) | ORAL | Status: DC
  Administered 2013-06-07 – 2013-06-08 (×4): 17 g via ORAL
  Filled 2013-06-06 (×8): qty 1

## 2013-06-06 MED ORDER — ENOXAPARIN SODIUM 40 MG/0.4ML ~~LOC~~ SOLN
40.0000 mg | Freq: Every day | SUBCUTANEOUS | Status: DC
  Administered 2013-06-07 – 2013-06-08 (×3): 40 mg via SUBCUTANEOUS
  Filled 2013-06-06 (×5): qty 0.4

## 2013-06-06 MED ORDER — HYDROCODONE-ACETAMINOPHEN 5-325 MG PO TABS
1.0000 | ORAL_TABLET | ORAL | Status: DC | PRN
  Filled 2013-06-06: qty 2

## 2013-06-06 MED ORDER — ACETAMINOPHEN 650 MG RE SUPP
650.0000 mg | RECTAL | Status: DC | PRN
  Administered 2013-06-06: 650 mg via RECTAL
  Filled 2013-06-06: qty 1

## 2013-06-06 MED ORDER — ACETAMINOPHEN 325 MG PO TABS
650.0000 mg | ORAL_TABLET | Freq: Four times a day (QID) | ORAL | Status: DC | PRN
  Administered 2013-06-07 (×2): 650 mg via ORAL
  Filled 2013-06-06 (×2): qty 2

## 2013-06-06 MED ORDER — INSULIN ASPART 100 UNIT/ML ~~LOC~~ SOLN
0.0000 [IU] | Freq: Three times a day (TID) | SUBCUTANEOUS | Status: DC
  Administered 2013-06-07 (×2): 1 [IU] via SUBCUTANEOUS
  Administered 2013-06-08 (×2): 2 [IU] via SUBCUTANEOUS
  Administered 2013-06-09: 1 [IU] via SUBCUTANEOUS

## 2013-06-06 MED ORDER — BISACODYL 5 MG PO TBEC
5.0000 mg | DELAYED_RELEASE_TABLET | Freq: Every day | ORAL | Status: DC | PRN
  Administered 2013-06-08: 5 mg via ORAL
  Filled 2013-06-06: qty 1

## 2013-06-06 MED ORDER — CEFTRIAXONE SODIUM 1 G IJ SOLR
1.0000 g | INTRAMUSCULAR | Status: DC
  Administered 2013-06-07: 1 g via INTRAVENOUS
  Filled 2013-06-06 (×2): qty 10

## 2013-06-06 MED ORDER — INSULIN ASPART 100 UNIT/ML ~~LOC~~ SOLN: 0.0000 [IU] | Freq: Every day | SUBCUTANEOUS | Status: DC

## 2013-06-06 MED ORDER — ONDANSETRON HCL 4 MG PO TABS: 4.0000 mg | ORAL_TABLET | Freq: Four times a day (QID) | ORAL | Status: DC | PRN

## 2013-06-06 NOTE — Progress Notes (Signed)
Pt arrived from the ED around 2200 to room 1514. Shortly after received message from charge RN that pt had elevated troponin level of 1.53 resulting at 2210. Pt's assessment is fairly unremarkable with moderate HOH, A&Ox4, heart with some irregularities but SR 80s on telemetry per CCM, lungs diminished, clear. LBM reported 06/06/13. IV to Rt. AC. MD paged immediately and 12 lead performed per verbal order. Family (daugher- Juliann Pulse) and pt made aware of situation and need for transfer. Pt is stable, will continue to monitor.

## 2013-06-06 NOTE — Progress Notes (Signed)
Nursing staff inform you but elevated troponin to 1.53. Patient continues to be chest pain free asked to repeat EKG. Continue cycle cardiac enzymes. Spoke to cardiology on-call who recommends transfer to [facility]. So far will hold off on heparin S. patient is chest pain-free and this is most likely type II NSTEMI given stress of underlining Infection.  Yaileen Hofferber 11:12 PM

## 2013-06-06 NOTE — ED Notes (Signed)
EMS states family member called 911 due to weakness. Upon arrival of EMS pt noted to be hot to touch, temp 104 on forehead. Nausea prior to coming in, small amt of emesis on shirt, pt has fallen twice yesterday. [phone] CBG 95% RA

## 2013-06-06 NOTE — ED Provider Notes (Signed)
CSN: [id number]     Arrival date & time 06/06/13  1751 History   First MD Initiated Contact with Patient 06/06/13 1806     Chief Complaint  Patient presents with  . Weakness  . Fever     (Consider location/radiation/quality/duration/timing/severity/associated sxs/prior Treatment) The history is provided by the patient. No language interpreter was used.  PRIYANSH PRY is a 80 y/o M with PMHx of arthritis, diabetes, heart murmur, hypertension, hypothyroidism, prostate cancer, M.D. S. presenting to the ED, brought in by EMS, with fever, weakness, nausea, vomiting that started today. Patient reported that he woke up not feeling that great. Stated that he has been feeling weak and stated that he has fallen secondary to feeling so weak. Patient reported that he has been feeling mildly nauseous and stated that he has had one episode of emesis while on the way here. Patient reported that he has been having intermittent sweating throughout the day. Reported that he lives home alone. Denied chest pain, shortness of breath, difficulty breathing, headache, neck pain, neck stiffness, dizziness, abdominal pain, changes to bowel movements, decreased urination. PCP Dr. Asa Lente  Past Medical History  Diagnosis Date  . Arthritis   . Diabetes mellitus   . Heart murmur   . Hypertension   . Hypothyroid   . Prostate cancer 1998 dx  . MDS (myelodysplastic syndrome) 12/2006     hyperplastic on bone marrow bx 2008   Past Surgical History  Procedure Laterality Date  . Appendectomy    . Total knee arthroplasty  2005    left   Family History  Problem Relation Age of Onset  . Prostate cancer Father 56   History  Substance Use Topics  . Smoking status: Never Smoker   . Smokeless tobacco: Never Used  . Alcohol Use: No    Review of Systems  Constitutional: Positive for fever, diaphoresis and fatigue. Negative for chills.  HENT: Negative for trouble swallowing.   Respiratory: Negative for cough, chest  tightness and shortness of breath.   Cardiovascular: Negative for chest pain.  Gastrointestinal: Positive for nausea and vomiting. Negative for abdominal pain, diarrhea and constipation.  Genitourinary: Negative for decreased urine volume.  Musculoskeletal: Negative for back pain and neck pain.  Neurological: Positive for weakness. Negative for dizziness.  All other systems reviewed and are negative.      Allergies  Statins  Home Medications   Current Outpatient Rx  Name  Route  Sig  Dispense  Refill  . bisacodyl (BISACODYL) 5 MG EC tablet   Oral   Take 5 mg by mouth daily as needed for mild constipation or moderate constipation.         Marland Kitchen diltiazem (CARDIZEM) 120 MG tablet   Oral   Take 1 tablet (120 mg total) by mouth daily.   90 tablet   1   . docusate sodium (COLACE) 100 MG capsule   Oral   Take 1 capsule (100 mg total) by mouth daily as needed for moderate constipation.   10 capsule   0   . levothyroxine (SYNTHROID, LEVOTHROID) 125 MCG tablet   Oral   Take 1 tablet (125 mcg total) by mouth daily.   90 tablet   1   . metFORMIN (GLUCOPHAGE) 500 MG tablet   Oral   Take 1 tablet (500 mg total) by mouth daily with breakfast.   90 tablet   1   . polyethylene glycol powder (GLYCOLAX/MIRALAX) powder   Oral   Take 17 g by  mouth 2 (two) times daily at 10 AM and 5 PM.   3350 g   1    BP 126/56  Pulse 89  Temp(Src) 100 F (37.8 C) (Oral)  Resp 17  SpO2 95% Physical Exam  Nursing note and vitals reviewed. Constitutional: He is oriented to person, place, and time. He appears well-developed and well-nourished. No distress.  Patient appears lethargic  HENT:  Head: Normocephalic and atraumatic.  Mouth/Throat: No oropharyngeal exudate.  Dry mucous membranes  Eyes: Conjunctivae and EOM are normal. Pupils are equal, round, and reactive to light. Right eye exhibits no discharge. Left eye exhibits no discharge.  Negative nystagmus Visual fields grossly intact   Neck: Normal range of motion. Neck supple. No tracheal deviation present.  Negative neck stiffness Negative nuchal rigidity Negative cervical lymphadenopathy  Cardiovascular: Normal rate, regular rhythm and normal heart sounds.  Exam reveals no friction rub.   No murmur heard. Pulses:      Radial pulses are 2+ on the right side, and 2+ on the left side.       Dorsalis pedis pulses are 2+ on the right side, and 2+ on the left side.  Pulmonary/Chest: Effort normal and breath sounds normal. No respiratory distress. He has no wheezes. He has no rales.  Negative respiratory distress Airway intact Patient stable to speak in full sentences without difficulty  Abdominal: Soft. Bowel sounds are normal. There is no tenderness. There is no guarding.  Musculoskeletal: Normal range of motion.  Full ROM to upper and lower extremities without difficulty noted, negative ataxia noted.  Lymphadenopathy:    He has no cervical adenopathy.  Neurological: He is alert and oriented to person, place, and time. No cranial nerve deficit. He exhibits normal muscle tone. Coordination normal.  Cranial nerves III-XII grossly intact Strength 5+/5+ to upper and lower extremities bilaterally with resistance applied, equal distribution noted  Skin: Skin is warm and dry. No rash noted. He is not diaphoretic. No erythema.  Psychiatric: He has a normal mood and affect. His behavior is normal. Thought content normal.    ED Course  Procedures (including critical care time)  8:38 PM This provider spoke with Dr. Roel Cluck , [facility] - discussed case, history, presentation, labs, imaging in great detail. Patient to be admitted to hospital for dehydration, weakness, fever, urinary tract infection.  Results for orders placed during the hospital encounter of 06/06/13  CBC WITH DIFFERENTIAL      Result Value Ref Range   WBC 7.4  4.0 - 10.5 K/uL   RBC 4.23  4.22 - 5.81 MIL/uL   Hemoglobin 12.6 (*) 13.0 - 17.0 g/dL    HCT 36.3 (*) 39.0 - 52.0 %   MCV 85.8  78.0 - 100.0 fL   MCH 29.8  26.0 - 34.0 pg   MCHC 34.7  30.0 - 36.0 g/dL   RDW 13.5  11.5 - 15.5 %   Platelets 147 (*) 150 - 400 K/uL   Neutrophils Relative % 72  43 - 77 %   Lymphocytes Relative 20  12 - 46 %   Monocytes Relative 8  3 - 12 %   Eosinophils Relative 0  0 - 5 %   Basophils Relative 0  0 - 1 %   Neutro Abs 5.3  1.7 - 7.7 K/uL   Lymphs Abs 1.5  0.7 - 4.0 K/uL   Monocytes Absolute 0.6  0.1 - 1.0 K/uL   Eosinophils Absolute 0.0  0.0 - 0.7 K/uL   Basophils  Absolute 0.0  0.0 - 0.1 K/uL   Smear Review PLATELET CLUMPS NOTED ON SMEAR    COMPREHENSIVE METABOLIC PANEL      Result Value Ref Range   Sodium 134 (*) 137 - 147 mEq/L   Potassium 4.0  3.7 - 5.3 mEq/L   Chloride 95 (*) 96 - 112 mEq/L   CO2 21  19 - 32 mEq/L   Glucose, Bld 139 (*) 70 - 99 mg/dL   BUN 20  6 - 23 mg/dL   Creatinine, Ser 0.96  0.50 - 1.35 mg/dL   Calcium 9.4  8.4 - 10.5 mg/dL   Total Protein 7.7  6.0 - 8.3 g/dL   Albumin 4.1  3.5 - 5.2 g/dL   AST 23  0 - 37 U/L   ALT 24  0 - 53 U/L   Alkaline Phosphatase 83  39 - 117 U/L   Total Bilirubin 0.6  0.3 - 1.2 mg/dL   GFR calc non Af Amer 69 (*) >90 mL/min   GFR calc Af Amer 80 (*) >90 mL/min  URINALYSIS, ROUTINE W REFLEX MICROSCOPIC      Result Value Ref Range   Color, Urine YELLOW  YELLOW   APPearance CLEAR  CLEAR   Specific Gravity, Urine 1.019  1.005 - 1.030   pH 7.0  5.0 - 8.0   Glucose, UA NEGATIVE  NEGATIVE mg/dL   Hgb urine dipstick SMALL (*) NEGATIVE   Bilirubin Urine NEGATIVE  NEGATIVE   Ketones, ur NEGATIVE  NEGATIVE mg/dL   Protein, ur 30 (*) NEGATIVE mg/dL   Urobilinogen, UA 1.0  0.0 - 1.0 mg/dL   Nitrite NEGATIVE  NEGATIVE   Leukocytes, UA MODERATE (*) NEGATIVE  TROPONIN I      Result Value Ref Range   Troponin I <0.30  <0.30 ng/mL  LIPASE, BLOOD      Result Value Ref Range   Lipase 55  11 - 59 U/L  URINE MICROSCOPIC-ADD ON      Result Value Ref Range   Squamous Epithelial / LPF RARE  RARE    WBC, UA TOO NUMEROUS TO COUNT  <3 WBC/hpf   Bacteria, UA FEW (*) RARE  I-STAT CG4 LACTIC ACID, ED      Result Value Ref Range   Lactic Acid, Venous 1.43  0.5 - 2.2 mmol/L  CBG MONITORING, ED      Result Value Ref Range   Glucose-Capillary 140 (*) 70 - 99 mg/dL   Ct Head Wo Contrast  06/06/2013   CLINICAL DATA:  Weakness and fever. Fever and vomiting. Two recent falls.  EXAM: CT HEAD WITHOUT CONTRAST  TECHNIQUE: Contiguous axial images were obtained from the base of the skull through the vertex without intravenous contrast.  COMPARISON:  01/16/2012 and 09/25/2009  FINDINGS: Ventricles and cisterns are within normal. There is mild age related atrophic change which is stable. There is mild chronic ischemic microvascular disease. There is no mass, mass effect, shift of midline structures or acute hemorrhage. No evidence of acute infarction. Remaining bones and soft tissues are unremarkable.  IMPRESSION: No acute intracranial findings.  Age related atrophy and chronic ischemic microvascular disease.   Electronically Signed   By: Marin Olp M.D.   On: 06/06/2013 19:29   Dg Chest Port 1 View  (if Code Sepsis Called)  06/06/2013   CLINICAL DATA:  Weakness.  Fever.  Sepsis.  EXAM: PORTABLE CHEST - 1 VIEW  COMPARISON:  DG CHEST 2 VIEW dated 02/15/2013; CT CHEST W/CM dated 10/19/2009  FINDINGS: Cardiomegaly. No evidence of failure. No airspace disease or effusion. Stable nodular density in the left mid chest represents a bone island seen on prior chest CT. Severe right glenohumeral osteoarthritis with high-riding humeral head consistent with chronic rotator cuff tear. Marland Kitchen  IMPRESSION: Cardiomegaly without failure or acute cardiopulmonary disease.   Electronically Signed   By: Dereck Ligas M.D.   On: 06/06/2013 19:10   Labs Review Labs Reviewed  CBC WITH DIFFERENTIAL - Abnormal; Notable for the following:    Hemoglobin 12.6 (*)    HCT 36.3 (*)    Platelets 147 (*)    All other components within normal  limits  COMPREHENSIVE METABOLIC PANEL - Abnormal; Notable for the following:    Sodium 134 (*)    Chloride 95 (*)    Glucose, Bld 139 (*)    GFR calc non Af Amer 69 (*)    GFR calc Af Amer 80 (*)    All other components within normal limits  URINALYSIS, ROUTINE W REFLEX MICROSCOPIC - Abnormal; Notable for the following:    Hgb urine dipstick SMALL (*)    Protein, ur 30 (*)    Leukocytes, UA MODERATE (*)    All other components within normal limits  URINE MICROSCOPIC-ADD ON - Abnormal; Notable for the following:    Bacteria, UA FEW (*)    All other components within normal limits  CBG MONITORING, ED - Abnormal; Notable for the following:    Glucose-Capillary 140 (*)    All other components within normal limits  CULTURE, BLOOD (ROUTINE X 2)  CULTURE, BLOOD (ROUTINE X 2)  URINE CULTURE  TROPONIN I  LIPASE, BLOOD  I-STAT CG4 LACTIC ACID, ED   Imaging Review Ct Head Wo Contrast  06/06/2013   CLINICAL DATA:  Weakness and fever. Fever and vomiting. Two recent falls.  EXAM: CT HEAD WITHOUT CONTRAST  TECHNIQUE: Contiguous axial images were obtained from the base of the skull through the vertex without intravenous contrast.  COMPARISON:  01/16/2012 and 09/25/2009  FINDINGS: Ventricles and cisterns are within normal. There is mild age related atrophic change which is stable. There is mild chronic ischemic microvascular disease. There is no mass, mass effect, shift of midline structures or acute hemorrhage. No evidence of acute infarction. Remaining bones and soft tissues are unremarkable.  IMPRESSION: No acute intracranial findings.  Age related atrophy and chronic ischemic microvascular disease.   Electronically Signed   By: Marin Olp M.D.   On: 06/06/2013 19:29   Dg Chest Port 1 View  (if Code Sepsis Called)  06/06/2013   CLINICAL DATA:  Weakness.  Fever.  Sepsis.  EXAM: PORTABLE CHEST - 1 VIEW  COMPARISON:  DG CHEST 2 VIEW dated 02/15/2013; CT CHEST W/CM dated 10/19/2009  FINDINGS:  Cardiomegaly. No evidence of failure. No airspace disease or effusion. Stable nodular density in the left mid chest represents a bone island seen on prior chest CT. Severe right glenohumeral osteoarthritis with high-riding humeral head consistent with chronic rotator cuff tear. Marland Kitchen  IMPRESSION: Cardiomegaly without failure or acute cardiopulmonary disease.   Electronically Signed   By: Dereck Ligas M.D.   On: 06/06/2013 19:10     EKG Interpretation None      MDM   Final diagnoses:  UTI (urinary tract infection)  Fever  Weakness   Medications  acetaminophen (TYLENOL) suppository 650 mg (650 mg Rectal Given 06/06/13 1813)  sodium chloride 0.9 % bolus 1,000 mL (0 mLs Intravenous Stopped 06/06/13 2007)  cefTRIAXone (ROCEPHIN) 1  g in dextrose 5 % 50 mL IVPB (1 g Intravenous New Bag/Given 06/06/13 2006)  sodium chloride 0.9 % bolus 1,000 mL (1,000 mLs Intravenous New Bag/Given 06/06/13 2007)   Filed Vitals:   06/06/13 1806 06/06/13 1938  BP: 163/92 126/56  Pulse: 97 89  Temp: 102.2 F (39 C) 100 F (37.8 C)  TempSrc: Oral Oral  Resp:  17  SpO2: 95% 95%    Patient presenting to the ED, BIB EMS, regarding fever and weakness. Fever was recorded to be 104F at the house, upon arrival to the ED patient's fever was 102.31F rectal. Patient reported that he's been feeling weak all day. Reported one episode of emesis en route to the hospital. Stated he had multiple full stay secondary to feeling so weak. Patient stated he's been able to keep any food or fluids down. Patient lives at home alone. Denied chest pain, shortness of breath difficulty breathing, dizziness, abdominal pain.  Alert and oriented. GCS 15. Heart rate and rhythm normal. Lungs clear to auscultation. Radial and DP pulses 2+ bilaterally. Negative facial trauma or hematomas noted. Negative nystagmus. PERRLA. Dry mucous membranes noted. Patient appears lethargic. Negative diaphoresis noted. Cap refill less than 3 seconds. Patient is able to  move all extremities without difficulty-negative ataxia with motion. Strength intact with equal distribution. Negative signs of respiratory distress. EKG noted normal sinus rhythm with heart rate 89 bpm with LBBB, LAFB. Troponin negative elevation. CBC negative elevation white blood cell count. Lipase negative elevation. Lactic acid 1.43.  Urinalysis noted small hemoglobin with moderate leukocytes with too numerous to count white blood cells-positive for pyuria. Urine culture in process. Blood culture x2 in process. Chest x-ray noted cardiomegaly without failure acute cardiopulmonary disease. CT head negative for acute abnormalities. Patient presenting to the ED with a fever 102.31F, rectally-Tylenol suppository administered with fever dropping to 100F. Patient given IV fluids secondary to dehydration. Patient noted to have urinary tract infection. Patient to be admitted to hospital regarding fever, weakness, urinary tract infection and dehydration. This provider spoke with [facility] - patient to be admitted as an inpatient to Telemetry floor. Patient stable for transfer.  Jamse Mead, PA-C 06/07/13 1303

## 2013-06-06 NOTE — H&P (Signed)
PCP: Gwendolyn Grant, MD    Chief Complaint:  Generalized weakness Oncology [name] [name]  HPI: [name] [name] is a 80 y.o. male   has a past medical history of Arthritis; Diabetes mellitus; Heart murmur; Hypertension; Hypothyroid; Prostate cancer (1998 dx); and MDS (myelodysplastic syndrome) (12/2006 ).   Presented with  Patient have had some chills and burning with urination for the past few days. He tried to drink green tea think he may have UTI but no improvement. Started to have nausea and vomiting. Denies any diarrhea. He was not aware of any fever. (11 was called due to generalized weakness. He was brought to [facility] ER and found to fever up to 102.2 and UTI. CXR was unremarkable. Hospitalist called for admission. Patient states he is feeling better. Reports poor PO intake over past few days.   Review of Systems:    Pertinent positives include: Fevers, chills, nausea, vomiting, urgency or frequency, dysuria,  Constitutional:  No weight loss, night sweats,fatigue, weight loss  HEENT:  No headaches, Difficulty swallowing,Tooth/dental problems,Sore throat,  No sneezing, itching, ear ache, nasal congestion, post nasal drip,  Cardio-vascular:  No chest pain, Orthopnea, PND, anasarca, dizziness, palpitations.no Bilateral lower extremity swelling  GI:  No heartburn, indigestion, abdominal pain,  diarrhea, change in bowel habits, loss of appetite, melena, blood in stool, hematemesis Resp:  no shortness of breath at rest. No dyspnea on exertion, No excess mucus, no productive cough, No non-productive cough, No coughing up of blood.No change in color of mucus.No wheezing. Skin:  no rash or lesions. No jaundice GU:  no  change in color of urine No straining to urinate.  No flank pain.  Musculoskeletal:  No joint pain or no joint swelling. No decreased range of motion. No back pain.  Psych:  No change in mood or affect. No depression or anxiety. No memory loss.  Neuro: no localizing  neurological complaints, no tingling, no weakness, no double vision, no gait abnormality, no slurred speech, no confusion  Otherwise ROS are negative except for above, 10 systems were reviewed  Past Medical History: Past Medical History  Diagnosis Date  . Arthritis   . Diabetes mellitus   . Heart murmur   . Hypertension   . Hypothyroid   . Prostate cancer 1998 dx  . MDS (myelodysplastic syndrome) 12/2006     hyperplastic on bone marrow bx 2008   Past Surgical History  Procedure Laterality Date  . Appendectomy    . Total knee arthroplasty  2005    left     Medications: Prior to Admission medications   Medication Sig Start Date End Date Taking? Authorizing Provider  bisacodyl (BISACODYL) 5 MG EC tablet Take 5 mg by mouth daily as needed for mild constipation or moderate constipation.   Yes Historical Provider, MD  diltiazem (CARDIZEM) 120 MG tablet Take 1 tablet (120 mg total) by mouth daily. 11/17/12  Yes Rowe Clack, MD  docusate sodium (COLACE) 100 MG capsule Take 1 capsule (100 mg total) by mouth daily as needed for moderate constipation. 05/26/13  Yes Rowe Clack, MD  levothyroxine (SYNTHROID, LEVOTHROID) 125 MCG tablet Take 1 tablet (125 mcg total) by mouth daily. 11/17/12  Yes Rowe Clack, MD  metFORMIN (GLUCOPHAGE) 500 MG tablet Take 1 tablet (500 mg total) by mouth daily with breakfast. 11/17/12  Yes Rowe Clack, MD  polyethylene glycol powder (GLYCOLAX/MIRALAX) powder Take 17 g by mouth 2 (two) times daily at 10 AM and 5 PM. 05/26/13  Yes Mateo Flow  Curlene Dolphin, MD    Allergies:   Allergies  Allergen Reactions  . Statins     Age>90    Social History:  Ambulatory  independently   Lives at   Home alone   reports that he has never smoked. He has never used smokeless tobacco. He reports that he does not drink alcohol or use illicit drugs.   Family History: family history includes Prostate cancer (age of onset: 46) in his father.    Physical  Exam: Patient Vitals for the past 24 hrs:  BP Temp Temp src Pulse Resp SpO2  06/06/13 1938 126/56 mmHg 100 F (37.8 C) Oral 89 17 95 %  06/06/13 1806 163/92 mmHg 102.2 F (39 C) Oral 97 - 95 %    1. General:  in No Acute distress 2. Psychological: Alert and  Oriented 3. Head/ENT:     Dry Mucous Membranes                          Head Non traumatic, neck supple                          Normal Dentition 4. SKIN: decreased Skin turgor,  Skin clean Dry and intact no rash 5. Heart: Regular rate and rhythm no Murmur, Rub or gallop 6. Lungs: Clear to auscultation bilaterally, no wheezes or crackles   7. Abdomen: Soft, non-tender, Non distended 8. Lower extremities: no clubbing, cyanosis, or edema 9. Neurologically Grossly intact, moving all 4 extremities equally 10. MSK: Normal range of motion, no costovertebral tenderness.  body mass index is unknown because there is no weight on file.   Labs on Admission:   Recent Labs  06/06/13 1802  NA 134*  K 4.0  CL 95*  CO2 21  GLUCOSE 139*  BUN 20  CREATININE 0.96  CALCIUM 9.4    Recent Labs  06/06/13 1802  AST 23  ALT 24  ALKPHOS 83  BILITOT 0.6  PROT 7.7  ALBUMIN 4.1    Recent Labs  06/06/13 1825  LIPASE 55    Recent Labs  06/06/13 1802  WBC 7.4  NEUTROABS 5.3  HGB 12.6*  HCT 36.3*  MCV 85.8  PLT 147*    Recent Labs  06/06/13 1825  TROPONINI <0.30   No results found for this basename: TSH, T4TOTAL, FREET3, T3FREE, THYROIDAB,  in the last 72 hours No results found for this basename: VITAMINB12, FOLATE, FERRITIN, TIBC, IRON, RETICCTPCT,  in the last 72 hours Lab Results  Component Value Date   HGBA1C 6.3 05/26/2013    The CrCl is unknown because both a height and weight (above a minimum accepted value) are required for this calculation. ABG No results found for this basename: phart, pco2, po2, hco3, tco2, acidbasedef, o2sat     No results found for this basename: DDIMER     Other results:  I  have pearsonaly reviewed this: ECG REPORT  Rate: 86  Rhythm: RBBB ST&T Change: no ischemic changes  UA evidence of UTI   Cultures:    Component Value Date/Time   SDES SPUTUM 10/19/2009 0804   SPECREQUEST NONE 10/19/2009 0804   CULT  Value: MODERATE HAEMOPHILUS INFLUENZAE Note: BETA LACTAMASE POSITIVE 10/19/2009 0804   REPTSTATUS 10/22/2009 FINAL 10/19/2009 0804       Radiological Exams on Admission: Ct Head Wo Contrast  06/06/2013   CLINICAL DATA:  Weakness and fever. Fever and vomiting. Two recent  falls.  EXAM: CT HEAD WITHOUT CONTRAST  TECHNIQUE: Contiguous axial images were obtained from the base of the skull through the vertex without intravenous contrast.  COMPARISON:  01/16/2012 and 09/25/2009  FINDINGS: Ventricles and cisterns are within normal. There is mild age related atrophic change which is stable. There is mild chronic ischemic microvascular disease. There is no mass, mass effect, shift of midline structures or acute hemorrhage. No evidence of acute infarction. Remaining bones and soft tissues are unremarkable.  IMPRESSION: No acute intracranial findings.  Age related atrophy and chronic ischemic microvascular disease.   Electronically Signed   By: Marin Olp M.D.   On: 06/06/2013 19:29   Dg Chest Port 1 View  (if Code Sepsis Called)  06/06/2013   CLINICAL DATA:  Weakness.  Fever.  Sepsis.  EXAM: PORTABLE CHEST - 1 VIEW  COMPARISON:  DG CHEST 2 VIEW dated 02/15/2013; CT CHEST W/CM dated 10/19/2009  FINDINGS: Cardiomegaly. No evidence of failure. No airspace disease or effusion. Stable nodular density in the left mid chest represents a bone island seen on prior chest CT. Severe right glenohumeral osteoarthritis with high-riding humeral head consistent with chronic rotator cuff tear. Marland Kitchen  IMPRESSION: Cardiomegaly without failure or acute cardiopulmonary disease.   Electronically Signed   By: Dereck Ligas M.D.   On: 06/06/2013 19:10    Chart has been reviewed  Assessment/Plan    80 yo M with hx of DM and MDS here with UTI resulting in pyelonephritis    Present on Admission:  . Pyelonephritis - give nausea and vomiting pyelonephritis is likely. Will cover with be antibiotics, beta cell urine culture. Obtain renal ultrasound to evaluate for any structural abnormalities  . Type II or unspecified type diabetes mellitus with neurological manifestations, not stated as uncontrolled - hold metformin continue sliding scale  . MDS (myelodysplastic syndrome) - patient is not receiving active treatment for this at this time he have not had much of any trouble with recurrent infections, has chronic history of mild pancytopenia that has been asymptomatic until now . UTI (lower urinary tract infection) - treat with Rocephin  . Chronic constipation - bowel regimen   Prophylaxis:  Lovenox   CODE STATUS: DNR/DNI per patient  Other plan as per orders.  I have spent a total of 55 min on this admission  Bresha Hosack 06/06/2013, 9:00 PM

## 2013-06-07 ENCOUNTER — Inpatient Hospital Stay (HOSPITAL_COMMUNITY): Payer: Medicare Other

## 2013-06-07 ENCOUNTER — Encounter (HOSPITAL_COMMUNITY): Payer: Self-pay

## 2013-06-07 DIAGNOSIS — E119 Type 2 diabetes mellitus without complications: Secondary | ICD-10-CM

## 2013-06-07 DIAGNOSIS — R7989 Other specified abnormal findings of blood chemistry: Secondary | ICD-10-CM

## 2013-06-07 DIAGNOSIS — N390 Urinary tract infection, site not specified: Secondary | ICD-10-CM

## 2013-06-07 DIAGNOSIS — I214 Non-ST elevation (NSTEMI) myocardial infarction: Secondary | ICD-10-CM

## 2013-06-07 DIAGNOSIS — E538 Deficiency of other specified B group vitamins: Secondary | ICD-10-CM

## 2013-06-07 DIAGNOSIS — R5383 Other fatigue: Secondary | ICD-10-CM

## 2013-06-07 DIAGNOSIS — F341 Dysthymic disorder: Secondary | ICD-10-CM

## 2013-06-07 DIAGNOSIS — R5381 Other malaise: Secondary | ICD-10-CM

## 2013-06-07 LAB — GLUCOSE, CAPILLARY
GLUCOSE-CAPILLARY: 143 mg/dL — AB (ref 70–99)
GLUCOSE-CAPILLARY: 98 mg/dL (ref 70–99)
GLUCOSE-CAPILLARY: 145 mg/dL — AB (ref 70–99)
Glucose-Capillary: 126 mg/dL — ABNORMAL HIGH (ref 70–99)
Glucose-Capillary: 137 mg/dL — ABNORMAL HIGH (ref 70–99)

## 2013-06-07 LAB — LIPID PANEL
CHOL/HDL RATIO: 2.3 ratio
Cholesterol: 102 mg/dL (ref 0–200)
HDL: 45 mg/dL (ref >39)
LDL Cholesterol: 41 mg/dL (ref 0–99)
Triglycerides: 79 mg/dL (ref <150)
VLDL: 16 mg/dL (ref 0–40)

## 2013-06-07 LAB — HEMOGLOBIN A1C
Hgb A1c MFr Bld: 6.4 % — ABNORMAL HIGH (ref <5.7)
Mean Plasma Glucose: 137 mg/dL — ABNORMAL HIGH (ref <117)

## 2013-06-07 MED ORDER — DILTIAZEM HCL ER 120 MG PO CP24
120.0000 mg | ORAL_CAPSULE | Freq: Every day | ORAL | Status: DC
  Administered 2013-06-07 – 2013-06-09 (×3): 120 mg via ORAL
  Filled 2013-06-07 (×4): qty 1

## 2013-06-07 MED ORDER — ASPIRIN 325 MG PO TABS
325.0000 mg | ORAL_TABLET | Freq: Once | ORAL | Status: AC
  Administered 2013-06-07: 325 mg via ORAL
  Filled 2013-06-07: qty 1

## 2013-06-07 NOTE — Consult Note (Signed)
Reason for Consult:Elevated Troponin Referring Physician: Olive [name] is an 80 y.o. male.  HPI: [name] is a 80 yo man with PMH of T2DM, hypertension, hypothyroidism, prostate cancer, MDS who presents with fever/chills, dysuria/frequency and nausea/vomiting over the last few days and found to have fever of 102 and urinalysis consistent with UTI. Antibiotics were started. Troponins drawn and initial was negative and repeat was 1.5 leading to cardiology consultation. He has no chest pain, no SOB. He continues to garden at home without difficulty. He lives alone, mows his grass and works on his [place] acre property. He really is asymptomatic without SOB at home. He thought he was just having prostate problems. No syncope.    Past Medical History  Diagnosis Date  . Arthritis   . Diabetes mellitus   . Heart murmur   . Hypertension   . Hypothyroid   . Prostate cancer 1998 dx  . MDS (myelodysplastic syndrome) 12/2006     hyperplastic on bone marrow bx 2008    Past Surgical History  Procedure Laterality Date  . Appendectomy    . Total knee arthroplasty  2005    left    Family History  Problem Relation Age of Onset  . Prostate cancer Father 85    Social History:  reports that he has never smoked. He has never used smokeless tobacco. He reports that he does not drink alcohol or use illicit drugs.  Allergies:  Allergies  Allergen Reactions  . Statins     Age>90    Medications:  I have reviewed the patient's current medications. Prior to Admission:  Prescriptions prior to admission  Medication Sig Dispense Refill  . bisacodyl (BISACODYL) 5 MG EC tablet Take 5 mg by mouth daily as needed for mild constipation or moderate constipation.      Marland Kitchen diltiazem (CARDIZEM) 120 MG tablet Take 1 tablet (120 mg total) by mouth daily.  90 tablet  1  . docusate sodium (COLACE) 100 MG capsule Take 1 capsule (100 mg total) by mouth daily as needed for moderate constipation.  10  capsule  0  . levothyroxine (SYNTHROID, LEVOTHROID) 125 MCG tablet Take 1 tablet (125 mcg total) by mouth daily.  90 tablet  1  . metFORMIN (GLUCOPHAGE) 500 MG tablet Take 1 tablet (500 mg total) by mouth daily with breakfast.  90 tablet  1  . polyethylene glycol powder (GLYCOLAX/MIRALAX) powder Take 17 g by mouth 2 (two) times daily at 10 AM and 5 PM.  3350 g  1   Scheduled: . aspirin EC  81 mg Oral Daily  . cefTRIAXone (ROCEPHIN) IVPB 1 gram/50 mL D5W  1 g Intravenous Q24H  . diltiazem  120 mg Oral Daily  . enoxaparin (LOVENOX) injection  40 mg Subcutaneous QHS  . insulin aspart  0-5 Units Subcutaneous QHS  . insulin aspart  0-9 Units Subcutaneous TID WC  . levothyroxine  125 mcg Oral QAC breakfast  . polyethylene glycol  17 g Oral BID  . sodium chloride  3 mL Intravenous Q12H   Continuous: . sodium chloride 100 mL/hr at 06/06/13 2223    Results for orders placed during the hospital encounter of 06/06/13 (from the past 48 hour(s))  CBC WITH DIFFERENTIAL     Status: Abnormal   Collection Time    06/06/13  6:02 PM      Result Value Ref Range   WBC 7.4  4.0 - 10.5 K/uL   RBC 4.23  4.22 - 5.81  MIL/uL   Hemoglobin 12.6 (*) 13.0 - 17.0 g/dL   HCT 36.3 (*) 39.0 - 52.0 %   MCV 85.8  78.0 - 100.0 fL   MCH 29.8  26.0 - 34.0 pg   MCHC 34.7  30.0 - 36.0 g/dL   RDW 13.5  11.5 - 15.5 %   Platelets 147 (*) 150 - 400 K/uL   Neutrophils Relative % 72  43 - 77 %   Lymphocytes Relative 20  12 - 46 %   Monocytes Relative 8  3 - 12 %   Eosinophils Relative 0  0 - 5 %   Basophils Relative 0  0 - 1 %   Neutro Abs 5.3  1.7 - 7.7 K/uL   Lymphs Abs 1.5  0.7 - 4.0 K/uL   Monocytes Absolute 0.6  0.1 - 1.0 K/uL   Eosinophils Absolute 0.0  0.0 - 0.7 K/uL   Basophils Absolute 0.0  0.0 - 0.1 K/uL   Smear Review PLATELET CLUMPS NOTED ON SMEAR    COMPREHENSIVE METABOLIC PANEL     Status: Abnormal   Collection Time    06/06/13  6:02 PM      Result Value Ref Range   Sodium 134 (*) 137 - 147 mEq/L    Potassium 4.0  3.7 - 5.3 mEq/L   Chloride 95 (*) 96 - 112 mEq/L   CO2 21  19 - 32 mEq/L   Glucose, Bld 139 (*) 70 - 99 mg/dL   BUN 20  6 - 23 mg/dL   Creatinine, Ser 0.96  0.50 - 1.35 mg/dL   Calcium 9.4  8.4 - 10.5 mg/dL   Total Protein 7.7  6.0 - 8.3 g/dL   Albumin 4.1  3.5 - 5.2 g/dL   AST 23  0 - 37 U/L   ALT 24  0 - 53 U/L   Alkaline Phosphatase 83  39 - 117 U/L   Total Bilirubin 0.6  0.3 - 1.2 mg/dL   GFR calc non Af Amer 69 (*) >90 mL/min   GFR calc Af Amer 80 (*) >90 mL/min   Comment: (NOTE)     The eGFR has been calculated using the CKD EPI equation.     This calculation has not been validated in all clinical situations.     eGFR's persistently <90 mL/min signify possible Chronic Kidney     Disease.  CBG MONITORING, ED     Status: Abnormal   Collection Time    06/06/13  6:23 PM      Result Value Ref Range   Glucose-Capillary 140 (*) 70 - 99 mg/dL  TROPONIN I     Status: None   Collection Time    06/06/13  6:25 PM      Result Value Ref Range   Troponin I <0.30  <0.30 ng/mL   Comment:            Due to the release kinetics of cTnI,     a negative result within the first hours     of the onset of symptoms does not rule out     myocardial infarction with certainty.     If myocardial infarction is still suspected,     repeat the test at appropriate intervals.  LIPASE, BLOOD     Status: None   Collection Time    06/06/13  6:25 PM      Result Value Ref Range   Lipase 55  11 - 59 U/L  URINALYSIS, ROUTINE W REFLEX  MICROSCOPIC     Status: Abnormal   Collection Time    06/06/13  6:27 PM      Result Value Ref Range   Color, Urine YELLOW  YELLOW   APPearance CLEAR  CLEAR   Specific Gravity, Urine 1.019  1.005 - 1.030   pH 7.0  5.0 - 8.0   Glucose, UA NEGATIVE  NEGATIVE mg/dL   Hgb urine dipstick SMALL (*) NEGATIVE   Bilirubin Urine NEGATIVE  NEGATIVE   Ketones, ur NEGATIVE  NEGATIVE mg/dL   Protein, ur 30 (*) NEGATIVE mg/dL   Urobilinogen, UA 1.0  0.0 - 1.0 mg/dL    Nitrite NEGATIVE  NEGATIVE   Leukocytes, UA MODERATE (*) NEGATIVE  URINE MICROSCOPIC-ADD ON     Status: Abnormal   Collection Time    06/06/13  6:27 PM      Result Value Ref Range   Squamous Epithelial / LPF RARE  RARE   WBC, UA TOO NUMEROUS TO COUNT  <3 WBC/hpf   Bacteria, UA FEW (*) RARE  I-STAT CG4 LACTIC ACID, ED     Status: None   Collection Time    06/06/13  6:35 PM      Result Value Ref Range   Lactic Acid, Venous 1.43  0.5 - 2.2 mmol/L  TROPONIN I     Status: Abnormal   Collection Time    06/06/13  9:10 PM      Result Value Ref Range   Troponin I 1.53 (*) <0.30 ng/mL   Comment:            Due to the release kinetics of cTnI,     a negative result within the first hours     of the onset of symptoms does not rule out     myocardial infarction with certainty.     If myocardial infarction is still suspected,     repeat the test at appropriate intervals.     CRITICAL RESULT CALLED TO, READ BACK BY AND VERIFIED WITH:     QCORE RN AT 2210 ON [id number] BY DLONG    Ct Head Wo Contrast  06/06/2013   CLINICAL DATA:  Weakness and fever. Fever and vomiting. Two recent falls.  EXAM: CT HEAD WITHOUT CONTRAST  TECHNIQUE: Contiguous axial images were obtained from the base of the skull through the vertex without intravenous contrast.  COMPARISON:  01/16/2012 and 09/25/2009  FINDINGS: Ventricles and cisterns are within normal. There is mild age related atrophic change which is stable. There is mild chronic ischemic microvascular disease. There is no mass, mass effect, shift of midline structures or acute hemorrhage. No evidence of acute infarction. Remaining bones and soft tissues are unremarkable.  IMPRESSION: No acute intracranial findings.  Age related atrophy and chronic ischemic microvascular disease.   Electronically Signed   By: Marin Olp M.D.   On: 06/06/2013 19:29   Dg Chest Port 1 View  (if Code Sepsis Called)  06/06/2013   CLINICAL DATA:  Weakness.  Fever.  Sepsis.  EXAM: PORTABLE  CHEST - 1 VIEW  COMPARISON:  DG CHEST 2 VIEW dated 02/15/2013; CT CHEST W/CM dated 10/19/2009  FINDINGS: Cardiomegaly. No evidence of failure. No airspace disease or effusion. Stable nodular density in the left mid chest represents a bone island seen on prior chest CT. Severe right glenohumeral osteoarthritis with high-riding humeral head consistent with chronic rotator cuff tear. Marland Kitchen  IMPRESSION: Cardiomegaly without failure or acute cardiopulmonary disease.   Electronically Signed   By: Cay Schillings  Lamke M.D.   On: 06/06/2013 19:10   Dg Abd Portable 1v  06/07/2013   CLINICAL DATA:  Nausea and vomiting  EXAM: PORTABLE ABDOMEN - 1 VIEW  COMPARISON:  10/18/2009  FINDINGS: Nonobstructive bowel gas pattern. Stool volume within normal limits. No evidence of urolithiasis or intra-abdominal mass. Advanced degenerative disc disease of the lumbar spine, with dextroscoliosis.  IMPRESSION: Nonobstructive bowel gas pattern.   Electronically Signed   By: Jorje Guild M.D.   On: 06/07/2013 02:04    Review of Systems  Constitutional: Positive for fever and chills. Negative for weight loss.  HENT: Positive for hearing loss. Negative for ear discharge.   Eyes: Negative for double vision and photophobia.  Respiratory: Negative for cough and sputum production.   Cardiovascular: Negative for chest pain, orthopnea and leg swelling.  Gastrointestinal: Positive for nausea and vomiting. Negative for diarrhea and constipation.  Genitourinary: Positive for dysuria and frequency. Negative for hematuria.  Musculoskeletal: Negative for myalgias and neck pain.  Skin: Negative for rash.  Neurological: Negative for dizziness and tingling.  Endo/Heme/Allergies: Negative for polydipsia. Does not bruise/bleed easily.  Psychiatric/Behavioral: Negative for depression, suicidal ideas, hallucinations and substance abuse.   Blood pressure 136/81, pulse 82, temperature 102.3 F (39.1 C), temperature source Oral, resp. rate 18, weight  70.1 kg (154 lb 8.7 oz), SpO2 97.00%. Physical Exam  Nursing note and vitals reviewed. Constitutional: He is oriented to person, place, and time. He appears well-developed and well-nourished. No distress.  HENT:  Head: Normocephalic and atraumatic.  Nose: Nose normal.  Mouth/Throat: Oropharynx is clear and moist. No oropharyngeal exudate.  Eyes: Conjunctivae and EOM are normal. Pupils are equal, round, and reactive to light. No scleral icterus.  Neck: Normal range of motion. Neck supple. No JVD present. No tracheal deviation present. No thyromegaly present.  Cardiovascular: Normal rate, regular rhythm, normal heart sounds and intact distal pulses.  Exam reveals no gallop.   No murmur heard. Respiratory: Effort normal and breath sounds normal. No respiratory distress. He has no wheezes. He has no rales.  GI: Soft. Bowel sounds are normal. He exhibits no distension. There is no tenderness. There is no rebound.  Musculoskeletal: Normal range of motion. He exhibits no edema and no tenderness.  Neurological: He is alert and oriented to person, place, and time. No cranial nerve deficit.  Skin: Skin is warm and dry. No rash noted. He is not diaphoretic. No erythema.  Psychiatric: He has a normal mood and affect. His behavior is normal. Thought content normal.   Labs reviewed; wbc 7.4, h/h 12.6/36.3, plt 147, na 134, K 4, bun/cr 20/0.96, hba1c 6.3, lactate 1.43, urinalysis with mod leukocytes, pyuria and bacteria EKG sinus rhythm, RBBB, 1st degree AV block, QRS 157 msec Echo results reviewed from 11/14: EF 60-65%, Grade I DD  Problem List UTI/Pyuria Elevated Troponin/? Type II NSTEMI MDS Mild Anemia T2DM Hypertension Hypothyroidism  Assessment/Plan: 80 yo man with PMH of hypertension, hypothyroidism, T2DM who presents with fever/chills/nausea/vomiting/dysuria and found to have UTI. Troponins found to go from <0.3 to 1.5 leading to consultation. Differential diagnosis for elevated troponin  are NSTEMI - type I vs. Type II in setting of significant illness among other etiologies. I favor a type II NSTEMI given lack of cardiac symptoms. However, given age, history of hypertension/T2DM and elevated troponins with fairly normal renal function, I suspect there may be underlying CAD. Will give large aspirin 324 mg x1. Continue daily aspirin. Will defer heparin for now given my preference for Type II  NSTEMI; however, will trend troponins and significantly rising, then will consider heparin. He is DNR/DNI but would like beneficial therapies that are not too invasive.  - trend cardiac biomarkers, continue on telemetry - give large aspirin - daily aspirin 81 mg - update risk factor profile - lipid panel, TSH, BNP - continue to treat underlying stressor - UTI - defer heparin unless large increase in troponin or development of symptoms - echocardiogram ordered and seems reasonable to evaluate for change in function, last echo 3 months ago  Leandrea Ackley, [place] 06/07/2013, 4:12 AM

## 2013-06-07 NOTE — Progress Notes (Signed)
Patient ID: [name] [name]  male  IOX:[id number]    DOB: 02/11/1918    DOA: 06/06/2013  PCP: Gwendolyn Grant, MD  Assessment/Plan: Principal Problem:   Pyelonephritis with UTI - Continue IV Rocephin, follow urine culture, blood cultures - renal ultrasound pending  Active Problems: Elevated troponin/ NSTEMI: Likely due to acute infectious process, SIRS, no current acute cardiac symptoms -Second set of troponin elevated at 1.53  - Cardiology consulted recommended serial cardiac enzymes, telemetry, aspirin 81 mg daily - 2-D echocardiogram, treat underlying UTI -Cholesterol 102, LDL 41    Type II or unspecified type diabetes mellitus with neurological manifestations, not stated as uncontrolled - Placed on sliding scale insulin     MDS (myelodysplastic syndrome): Not on any active treatment for this, chronic mild pancytopenia - Follow counts   constipation : Continue bowel regimen   DVT Prophylaxis: Lovenox   Code Status: DNR/DNI   Family Communication:  Disposition: patient reports he lives at home by himself, will start physical therapy   Consultants:  Cardiology   Procedures:  None   Antibiotics:  Rocephin     Subjective: Feels better this morning, denies any chest pain, shortness of breath, fevers or chills. Overnight temp 102.3   Objective: Weight change:   Intake/Output Summary (Last 24 hours) at 06/07/13 1248 Last data filed at 06/07/13 1100  Gross per 24 hour  Intake    240 ml  Output    850 ml  Net   -610 ml   Blood pressure 91/47, pulse 90, temperature 98.5 F (36.9 C), temperature source Oral, resp. rate 18, height 5\' 9"  (1.753 m), weight 70.1 kg (154 lb 8.7 oz), SpO2 99.00%.  Physical Exam: General: Alert and awake, oriented x3, not in any acute distress. CVS: S1-S2 clear, no murmur rubs or gallops Chest: clear to auscultation bilaterally, no wheezing, rales or rhonchi Abdomen: soft nontender, nondistended, normal bowel sounds  Extremities:  no cyanosis, clubbing or edema noted bilaterally Neuro: Cranial nerves II-XII intact, no focal neurological deficits  Lab Results: Basic Metabolic Panel:  Recent Labs Lab 06/06/13 1802  NA 134*  K 4.0  CL 95*  CO2 21  GLUCOSE 139*  BUN 20  CREATININE 0.96  CALCIUM 9.4   Liver Function Tests:  Recent Labs Lab 06/06/13 1802  AST 23  ALT 24  ALKPHOS 83  BILITOT 0.6  PROT 7.7  ALBUMIN 4.1    Recent Labs Lab 06/06/13 1825  LIPASE 55   No results found for this basename: AMMONIA,  in the last 168 hours CBC:  Recent Labs Lab 06/06/13 1802  WBC 7.4  NEUTROABS 5.3  HGB 12.6*  HCT 36.3*  MCV 85.8  PLT 147*   Cardiac Enzymes:  Recent Labs Lab 06/06/13 1825 06/06/13 2110  TROPONINI <0.30 1.53*   BNP: No components found with this basename: POCBNP,  CBG:  Recent Labs Lab 06/06/13 1823 06/07/13 0611 06/07/13 1147  GLUCAP 140* 126* 98     Micro Results: No results found for this or any previous visit (from the past 240 hour(s)).  Studies/Results: Ct Head Wo Contrast  06/06/2013   CLINICAL DATA:  Weakness and fever. Fever and vomiting. Two recent falls.  EXAM: CT HEAD WITHOUT CONTRAST  TECHNIQUE: Contiguous axial images were obtained from the base of the skull through the vertex without intravenous contrast.  COMPARISON:  01/16/2012 and 09/25/2009  FINDINGS: Ventricles and cisterns are within normal. There is mild age related atrophic change which is stable. There is mild chronic  ischemic microvascular disease. There is no mass, mass effect, shift of midline structures or acute hemorrhage. No evidence of acute infarction. Remaining bones and soft tissues are unremarkable.  IMPRESSION: No acute intracranial findings.  Age related atrophy and chronic ischemic microvascular disease.   Electronically Signed   By: Marin Olp M.D.   On: 06/06/2013 19:29   Dg Chest Port 1 View  (if Code Sepsis Called)  06/06/2013   CLINICAL DATA:  Weakness.  Fever.  Sepsis.   EXAM: PORTABLE CHEST - 1 VIEW  COMPARISON:  DG CHEST 2 VIEW dated 02/15/2013; CT CHEST W/CM dated 10/19/2009  FINDINGS: Cardiomegaly. No evidence of failure. No airspace disease or effusion. Stable nodular density in the left mid chest represents a bone island seen on prior chest CT. Severe right glenohumeral osteoarthritis with high-riding humeral head consistent with chronic rotator cuff tear. Marland Kitchen  IMPRESSION: Cardiomegaly without failure or acute cardiopulmonary disease.   Electronically Signed   By: Dereck Ligas M.D.   On: 06/06/2013 19:10   Dg Abd Portable 1v  06/07/2013   CLINICAL DATA:  Nausea and vomiting  EXAM: PORTABLE ABDOMEN - 1 VIEW  COMPARISON:  10/18/2009  FINDINGS: Nonobstructive bowel gas pattern. Stool volume within normal limits. No evidence of urolithiasis or intra-abdominal mass. Advanced degenerative disc disease of the lumbar spine, with dextroscoliosis.  IMPRESSION: Nonobstructive bowel gas pattern.   Electronically Signed   By: Jorje Guild M.D.   On: 06/07/2013 02:04    Medications: Scheduled Meds: . aspirin EC  81 mg Oral Daily  . cefTRIAXone (ROCEPHIN) IVPB 1 gram/50 mL D5W  1 g Intravenous Q24H  . diltiazem  120 mg Oral Daily  . enoxaparin (LOVENOX) injection  40 mg Subcutaneous QHS  . insulin aspart  0-5 Units Subcutaneous QHS  . insulin aspart  0-9 Units Subcutaneous TID WC  . levothyroxine  125 mcg Oral QAC breakfast  . polyethylene glycol  17 g Oral BID  . sodium chloride  3 mL Intravenous Q12H      LOS: 1 day   Lev Cervone M.D. [facility] 06/07/2013, 12:48 PM Pager: [phone]  If 7PM-7AM, please contact night-coverage [email or web address] Password [email or web address]

## 2013-06-07 NOTE — Progress Notes (Signed)
Utilization Review Completed.Orie Cuttino T3/05/2013  

## 2013-06-07 NOTE — Progress Notes (Signed)
[facility] progress note. Chief complaint. Transfer note. History of present illness. This 80 year old male presented to [facility] long emergency room with generalized weakness. He describes chills and burning with urination. Was found to be febrile 102.2, secondary to urinary tract infection/pyelonephritis. Part of workup included troponin which resulted elevated at 1.53. Cardiology was contacted and requested patient be transferred to [facility] for further workup. The patient has arrived and I am seeing him at bedside to ensure he remained clinically stable post transfer and his orders transferred appropriately. I find the patient alert and with no complaints of chest pain. Vital signs. Temperature 98.5, pulse 82, respiration 18, blood pressure 136/81. O2 sats 97%. General appearance. Frail elderly male who is alert and in no distress. Cardiac. Rate and rhythm regular. Lungs. Breath sounds clear and equal. Abdomen. Soft with positive bowel sounds. Impression/plan. Problem #1. Pyelonephritis. Patient covered with antibiotics. Renal ultrasound pending. Patient appears clinically stable. Problem #2 elevated troponin to 1.53. I will notify cardiology of patient's arrival in transfer. Further evaluation and orders as per cardiology consult.

## 2013-06-07 NOTE — Progress Notes (Signed)
Reviewed Dr. Claiborne Billings note. Agree  Trop 1.5. Trend. Would favor medical mgt, non invasive approach. No Chest pain.  I would like to add low dose statin but he has been intolerant.   Await ECHO.

## 2013-06-07 NOTE — Progress Notes (Signed)
Daughter called nurse into room stating her dad was "falling backword" in the bed, pt assisted to bed, temperature taken, 99.9 oral, o2 89% RA, pt placed on 2L [place], pt stated he did not feel good and was dizzy, will continue to monitor [name] Rhymes, RN

## 2013-06-07 NOTE — Progress Notes (Signed)
Pt had c/o inability to get warm with shaking chills. He then became too warm with N/V. T 102.3. Acetaminophen and antiemetic given. He remains alert and oriented.

## 2013-06-08 DIAGNOSIS — R279 Unspecified lack of coordination: Secondary | ICD-10-CM

## 2013-06-08 DIAGNOSIS — R509 Fever, unspecified: Secondary | ICD-10-CM

## 2013-06-08 LAB — GLUCOSE, CAPILLARY
GLUCOSE-CAPILLARY: 200 mg/dL — AB (ref 70–99)
GLUCOSE-CAPILLARY: 118 mg/dL — AB (ref 70–99)
Glucose-Capillary: 158 mg/dL — ABNORMAL HIGH (ref 70–99)
Glucose-Capillary: 141 mg/dL — ABNORMAL HIGH (ref 70–99)

## 2013-06-08 LAB — URINE CULTURE

## 2013-06-08 MED ORDER — VANCOMYCIN HCL 10 G IV SOLR
1250.0000 mg | INTRAVENOUS | Status: DC
  Administered 2013-06-08: 1250 mg via INTRAVENOUS
  Filled 2013-06-08 (×3): qty 1250

## 2013-06-08 MED ORDER — DOCUSATE SODIUM 100 MG PO CAPS
100.0000 mg | ORAL_CAPSULE | Freq: Two times a day (BID) | ORAL | Status: DC
  Administered 2013-06-08 (×2): 100 mg via ORAL
  Filled 2013-06-08 (×4): qty 1

## 2013-06-08 MED ORDER — CARVEDILOL 3.125 MG PO TABS
3.1250 mg | ORAL_TABLET | Freq: Two times a day (BID) | ORAL | Status: DC
  Administered 2013-06-08 – 2013-06-09 (×2): 3.125 mg via ORAL
  Filled 2013-06-08 (×4): qty 1

## 2013-06-08 MED ORDER — FLEET ENEMA 7-19 GM/118ML RE ENEM
1.0000 | ENEMA | Freq: Once | RECTAL | Status: AC
  Administered 2013-06-08: 1 via RECTAL
  Filled 2013-06-08 (×2): qty 1

## 2013-06-08 MED ORDER — PIPERACILLIN-TAZOBACTAM 3.375 G IVPB
3.3750 g | Freq: Three times a day (TID) | INTRAVENOUS | Status: DC
  Administered 2013-06-08 – 2013-06-09 (×3): 3.375 g via INTRAVENOUS
  Filled 2013-06-08 (×6): qty 50

## 2013-06-08 MED ORDER — METOPROLOL TARTRATE 12.5 MG HALF TABLET
12.5000 mg | ORAL_TABLET | Freq: Two times a day (BID) | ORAL | Status: DC
  Administered 2013-06-08: 12.5 mg via ORAL
  Filled 2013-06-08 (×3): qty 1

## 2013-06-08 MED ORDER — BISACODYL 5 MG PO TBEC
10.0000 mg | DELAYED_RELEASE_TABLET | Freq: Once | ORAL | Status: AC
  Administered 2013-06-08: 10 mg via ORAL
  Filled 2013-06-08: qty 2

## 2013-06-08 NOTE — Progress Notes (Signed)
Patient ID: [name] [name]  male  QVZ:[id number]    DOB: 1918-02-15    DOA: 06/06/2013  PCP: Gwendolyn Grant, MD  Assessment/Plan: Principal Problem:   Pyelonephritis with enterococcus UTI - Patient still spiking fever 101.8 this morning, urine culture showing enterococcus, sensitivity still pending  - Discontinue IV Rocephin, started on Vanco and Zosyn until sensitivities back, - Blood cultures negative so far blood cultures - renal ultrasound pending  Active Problems: Elevated troponin/ NSTEMI: Likely due to acute infectious process, SIRS, no current acute cardiac symptoms - Second set of troponin elevated at 1.53  - Cardiology consulted recommended serial cardiac enzymes, telemetry, aspirin 81 mg daily, medical management, added beta blocker, has allergy to statin - 2-D echocardiogram pending, treat underlying UTI -Cholesterol 102, LDL 41    Type II or unspecified type diabetes mellitus with neurological manifestations, not stated as uncontrolled - Placed on sliding scale insulin     MDS (myelodysplastic syndrome): Not on any active treatment for this, chronic mild pancytopenia - Follow counts   constipation :  - Added enema, MiraLax, Colace Dulcolax   DVT Prophylaxis: Lovenox   Code Status: DNR/DNI   Family Communication:  Disposition: patient reports he lives at home by himself, will start physical therapy   Consultants:  Cardiology   Procedures:  None   Antibiotics:  Rocephin 3/1 >>3/3  Added Vanc and Zosyn 3/3>>  Subjective: Feels constipated today  Objective: Weight change:   Intake/Output Summary (Last 24 hours) at 06/08/13 1127 Last data filed at 06/08/13 0233  Gross per 24 hour  Intake    480 ml  Output    350 ml  Net    130 ml   Blood pressure 123/73, pulse 94, temperature 101.8 F (38.8 C), temperature source Oral, resp. rate 20, height 5\' 9"  (1.753 m), weight 70.1 kg (154 lb 8.7 oz), SpO2 98.00%.  Physical Exam: General: Alert and  awake, oriented x3, not in any acute distress. CVS: S1-S2 clear, no murmur rubs or gallops Chest: clear to auscultation bilaterally, no wheezing, rales or rhonchi Abdomen: soft nontender, nondistended, normal bowel sounds  Extremities: no cyanosis, clubbing or edema noted bilaterally Neuro: Cranial nerves II-XII intact, no focal neurological deficits  Lab Results: Basic Metabolic Panel:  Recent Labs Lab 06/06/13 1802  NA 134*  K 4.0  CL 95*  CO2 21  GLUCOSE 139*  BUN 20  CREATININE 0.96  CALCIUM 9.4   Liver Function Tests:  Recent Labs Lab 06/06/13 1802  AST 23  ALT 24  ALKPHOS 83  BILITOT 0.6  PROT 7.7  ALBUMIN 4.1    Recent Labs Lab 06/06/13 1825  LIPASE 55   No results found for this basename: AMMONIA,  in the last 168 hours CBC:  Recent Labs Lab 06/06/13 1802  WBC 7.4  NEUTROABS 5.3  HGB 12.6*  HCT 36.3*  MCV 85.8  PLT 147*   Cardiac Enzymes:  Recent Labs Lab 06/06/13 1825 06/06/13 2110  TROPONINI <0.30 1.53*   BNP: No components found with this basename: POCBNP,  CBG:  Recent Labs Lab 06/07/13 0611 06/07/13 1147 06/07/13 1611 06/07/13 2025 06/08/13 0609  GLUCAP 126* 98 145* 137* 158*     Micro Results: Recent Results (from the past 240 hour(s))  CULTURE, BLOOD (ROUTINE X 2)     Status: None   Collection Time    06/06/13  6:25 PM      Result Value Ref Range Status   Specimen Description BLOOD RIGHT ANTECUBITAL  Final   Special Requests BOTTLES DRAWN AEROBIC AND ANAEROBIC 5CC   Final   Culture  Setup Time     Final   Value: 06/07/2013 03:29     Performed at [facility]   Culture     Final   Value:        BLOOD CULTURE RECEIVED NO GROWTH TO DATE CULTURE WILL BE HELD FOR 5 DAYS BEFORE ISSUING A FINAL NEGATIVE REPORT     Performed at [facility]   Report Status PENDING   Incomplete  URINE CULTURE     Status: None   Collection Time    06/06/13  6:27 PM      Result Value Ref Range Status   Specimen  Description URINE, CLEAN CATCH   Final   Special Requests NONE   Final   Culture  Setup Time     Final   Value: 06/07/2013 04:46     Performed at [place]     Final   Value: >=100,000 COLONIES/ML     Performed at [facility]   Culture     Final   Value: ENTEROCOCCUS SPECIES     Performed at [facility]   Report Status PENDING   Incomplete  CULTURE, BLOOD (ROUTINE X 2)     Status: None   Collection Time    06/06/13  6:49 PM      Result Value Ref Range Status   Specimen Description BLOOD LEFT ANTECUBITAL   Final   Special Requests BOTTLES DRAWN AEROBIC AND ANAEROBIC 4.5CC   Final   Culture  Setup Time     Final   Value: 06/07/2013 03:29     Performed at [facility]   Culture     Final   Value:        BLOOD CULTURE RECEIVED NO GROWTH TO DATE CULTURE WILL BE HELD FOR 5 DAYS BEFORE ISSUING A FINAL NEGATIVE REPORT     Performed at [facility]   Report Status PENDING   Incomplete    Studies/Results: Ct Head Wo Contrast  06/06/2013   CLINICAL DATA:  Weakness and fever. Fever and vomiting. Two recent falls.  EXAM: CT HEAD WITHOUT CONTRAST  TECHNIQUE: Contiguous axial images were obtained from the base of the skull through the vertex without intravenous contrast.  COMPARISON:  01/16/2012 and 09/25/2009  FINDINGS: Ventricles and cisterns are within normal. There is mild age related atrophic change which is stable. There is mild chronic ischemic microvascular disease. There is no mass, mass effect, shift of midline structures or acute hemorrhage. No evidence of acute infarction. Remaining bones and soft tissues are unremarkable.  IMPRESSION: No acute intracranial findings.  Age related atrophy and chronic ischemic microvascular disease.   Electronically Signed   By: Marin Olp M.D.   On: 06/06/2013 19:29   Dg Chest Port 1 View  (if Code Sepsis Called)  06/06/2013   CLINICAL DATA:  Weakness.  Fever.  Sepsis.  EXAM: PORTABLE CHEST - 1  VIEW  COMPARISON:  DG CHEST 2 VIEW dated 02/15/2013; CT CHEST W/CM dated 10/19/2009  FINDINGS: Cardiomegaly. No evidence of failure. No airspace disease or effusion. Stable nodular density in the left mid chest represents a bone island seen on prior chest CT. Severe right glenohumeral osteoarthritis with high-riding humeral head consistent with chronic rotator cuff tear. Marland Kitchen  IMPRESSION: Cardiomegaly without failure or acute cardiopulmonary disease.   Electronically Signed   By: Cay Schillings  Lamke M.D.   On: 06/06/2013 19:10   Dg Abd Portable 1v  06/07/2013   CLINICAL DATA:  Nausea and vomiting  EXAM: PORTABLE ABDOMEN - 1 VIEW  COMPARISON:  10/18/2009  FINDINGS: Nonobstructive bowel gas pattern. Stool volume within normal limits. No evidence of urolithiasis or intra-abdominal mass. Advanced degenerative disc disease of the lumbar spine, with dextroscoliosis.  IMPRESSION: Nonobstructive bowel gas pattern.   Electronically Signed   By: Jorje Guild M.D.   On: 06/07/2013 02:04    Medications: Scheduled Meds: . aspirin EC  81 mg Oral Daily  . diltiazem  120 mg Oral Daily  . docusate sodium  100 mg Oral BID  . enoxaparin (LOVENOX) injection  40 mg Subcutaneous QHS  . insulin aspart  0-5 Units Subcutaneous QHS  . insulin aspart  0-9 Units Subcutaneous TID WC  . levothyroxine  125 mcg Oral QAC breakfast  . metoprolol tartrate  12.5 mg Oral BID  . polyethylene glycol  17 g Oral BID  . sodium chloride  3 mL Intravenous Q12H      LOS: 2 days   Gurveer Colucci M.D. [facility] 06/08/2013, 11:27 AM Pager: [phone]  If 7PM-7AM, please contact night-coverage [email or web address] Password [email or web address]

## 2013-06-08 NOTE — Progress Notes (Signed)
CSW spoke to patient's daughter over the phone to explain the recommendation of SNF and explained that social worker spoke with patient and he is agreeable to SNF. Patient's daughter states that is fine and she will speak with patient today and social worker to follow up in the morning.  Jeanette Caprice, MSW, [place]

## 2013-06-08 NOTE — Progress Notes (Signed)
Clinical Social Work Department BRIEF PSYCHOSOCIAL ASSESSMENT 06/08/2013  Patient:  [name] [name], [name] [name]     Account Number:  [id number]     Admit date:  06/06/2013  Clinical Social Worker:  Megan Salon  Date/Time:  06/08/2013 02:42 PM  Referred by:  RN  Date Referred:  06/08/2013 Referred for  SNF Placement   Other Referral:   Interview type:  Patient Other interview type:    PSYCHOSOCIAL DATA Living Status:  ALONE Admitted from facility:   Level of care:   Primary support name:  [name] [name] Primary support relationship to patient:  CHILD, ADULT Degree of support available:   Good    CURRENT CONCERNS Current Concerns  Post-Acute Placement   Other Concerns:    SOCIAL WORK ASSESSMENT / PLAN Clinical Social Worker received referral for SNF placement at d/c. CSW introduced self and explained reason for visit. CSW explained SNF process to patient. Patient reported he is agreeable for SNF placement and prefers [facility]. CSW encouraged patient and  to think about additional SNF options pending availability of preferred facility. CSW will complete FL2 for MD's signature and will update patient  and family when bed offers are received.   Assessment/plan status:  Psychosocial Support/Ongoing Assessment of Needs Other assessment/ plan:   Information/referral to community resources:   SNF information    PATIENT'S/FAMILY'S RESPONSE TO PLAN OF CARE: Patient states he is agreeable for snf placement and states he thinks therapy is good for him. Patient provided social worker with phone number to daughter to inform her of bed choices.        Jeanette Caprice, MSW, [place]

## 2013-06-08 NOTE — Progress Notes (Signed)
Clinical Social Work Department CLINICAL SOCIAL WORK PLACEMENT NOTE 06/08/2013  Patient:  [name] [name], [name] [name]  Account Number:  [id number] Admit date:  06/06/2013  Clinical Social Worker:  Megan Salon  Date/time:  06/08/2013 02:45 PM  Clinical Social Work is seeking post-discharge placement for this patient at the following level of care:   [place]   (*CSW will update this form in Epic as items are completed)   06/08/2013  Patient/family provided with [place] Department of Clinical Social Work's list of facilities offering this level of care within the geographic area requested by the patient (or if unable, by the patient's family).  06/08/2013  Patient/family informed of their freedom to choose among providers that offer the needed level of care, that participate in Medicare, Medicaid or managed care program needed by the patient, have an available bed and are willing to accept the patient.  06/08/2013  Patient/family informed of [facility]' ownership interest in [facility], as well as of the fact that they are under no obligation to receive care at this facility.  PASARR submitted to EDS on 06/08/2013 PASARR number received from EDS on 06/08/2013  FL2 transmitted to all facilities in geographic area requested by pt/family on  06/08/2013 FL2 transmitted to all facilities within larger geographic area on   Patient informed that his/her managed care company has contracts with or will negotiate with  certain facilities, including the following:     Patient/family informed of bed offers received:   Patient chooses bed at  Physician recommends and patient chooses bed at    Patient to be transferred to  on   Patient to be transferred to facility by   The following physician request were entered in Epic:   Additional Comments:  Jeanette Caprice, MSW, [place]

## 2013-06-08 NOTE — Evaluation (Signed)
Physical Therapy Evaluation Patient Details Name: [name] [name] MRN: [id number] DOB: 02-21-1918 Today's Date: 06/08/2013 Time: 4854-6270 PT Time Calculation (min): 22 min  PT Assessment / Plan / Recommendation History of Present Illness  This 80 year old male presented to [facility] long emergency room with generalized weakness. He describes chills and burning with urination. Was found to be febrile 102.2, secondary to urinary tract infection/pyelonephritis. Part of workup included troponin which resulted elevated at 1.53. Cardiology was contacted and requested patient be transferred to [facility] for further workup.  Pt continues to be progressively weak.  Clinical Impression  Pt admitted with weakness and UTI.  Pt currently limited functionally due to the problems listed. ( See problems list.)   Pt will benefit from PT to maximize function and safety in order to get ready for next venue listed below.     PT Assessment  Patient needs continued PT services    Follow Up Recommendations  SNF    Does the patient have the potential to tolerate intense rehabilitation      Barriers to Discharge Decreased caregiver support      Equipment Recommendations  None recommended by PT    Recommendations for Other Services     Frequency Min 3X/week    Precautions / Restrictions Precautions Precautions: Fall   Pertinent Vitals/Pain       Mobility  Bed Mobility Overal bed mobility: Needs Assistance Bed Mobility: Supine to Sit;Sit to Supine Supine to sit: Min assist;HOB elevated Sit to supine: Min assist General bed mobility comments: assist needed due to general total body weakness Transfers Overall transfer level: Needs assistance Transfers: Sit to/from Stand Sit to Stand: Min assist General transfer comment: lifting and steady assist Ambulation/Gait Ambulation/Gait assistance: Min assist;Mod assist (mod as he quickly fatigued) Ambulation Distance (Feet): 20 Feet Assistive  device: Rolling walker (2 wheeled) Gait Pattern/deviations: Step-through pattern;Scissoring;Wide base of support    Exercises     PT Diagnosis: Difficulty walking;Generalized weakness  PT Problem List: Decreased strength;Decreased activity tolerance;Decreased balance;Decreased mobility;Decreased knowledge of use of DME PT Treatment Interventions: DME instruction;Functional mobility training;Gait training;Therapeutic activities;Patient/family education;Balance training     PT Goals(Current goals can be found in the care plan section) Acute Rehab PT Goals Patient Stated Goal: I've been thinking I need some rehab to get stronger before I go home. PT Goal Formulation: With patient Time For Goal Achievement: 06/22/13 Potential to Achieve Goals: Good  Visit Information  Last PT Received On: 06/08/13 Assistance Needed: +1 History of Present Illness: This 80 year old male presented to [facility] long emergency room with generalized weakness. He describes chills and burning with urination. Was found to be febrile 102.2, secondary to urinary tract infection/pyelonephritis. Part of workup included troponin which resulted elevated at 1.53. Cardiology was contacted and requested patient be transferred to [facility] for further workup.  Pt continues to be progressively weak.       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  Pt wishes to get some rehab before trying to get home. Prior Function Level of Independence: Independent Comments: pt reports he does not use AD to ambulate but holds onto furniture  while ambulating at home and has too many  falls in the past 6 months to count. Communication Communication: No difficulties    Cognition  Cognition Arousal/Alertness: Awake/alert Behavior During Therapy: WFL for tasks assessed/performed Overall Cognitive Status: Within Functional Limits for tasks assessed    Extremity/Trunk Assessment Upper Extremity Assessment Upper Extremity Assessment: Generalized weakness Lower Extremity Assessment Lower Extremity Assessment: Generalized weakness   Balance Balance Overall balance assessment: Needs assistance Sitting-balance support: Single extremity supported;Feet supported Sitting balance-Leahy Scale: Fair Standing balance support: Bilateral upper extremity supported;During functional activity Standing balance-Leahy Scale: Poor  End of Session PT - End of Session Equipment Utilized During Treatment: Gait belt Activity Tolerance: Patient limited by fatigue Patient left: in bed;with call bell/phone within reach Nurse Communication: Mobility status  GP     Messi Twedt, Tessie Fass 06/08/2013, 2:16 PM 06/08/2013  Donnella Sham, [place] [phone]  (pager)

## 2013-06-08 NOTE — Progress Notes (Signed)
ANTIBIOTIC CONSULT NOTE - INITIAL  Pharmacy Consult for Vancomycin and Zosyn Indication: Enterococcal UTI with Pyelonephritis  Allergies  Allergen Reactions  . Statins     Age>90    Patient Measurements: Height: 5\' 9"  (175.3 cm) Weight: 154 lb 8.7 oz (70.1 kg) IBW/kg (Calculated) : 70.7  Vital Signs: Temp: 101.8 F (38.8 C) (03/03 0349) Temp src: Oral (03/03 0349) BP: 123/73 mmHg (03/03 0900) Pulse Rate: 94 (03/03 0900) Intake/Output from previous day: 03/02 0701 - 03/03 0700 In: 720 [P.O.:720] Out: 675 [Urine:675] Intake/Output from this shift: Total I/O In: 480 [P.O.:480] Out: -   Labs:  Recent Labs  06/06/13 1802  WBC 7.4  HGB 12.6*  PLT 147*  CREATININE 0.96   Estimated Creatinine Clearance: 47.7 ml/min (by C-G formula based on Cr of 0.96). No results found for this basename: VANCOTROUGH, VANCOPEAK, VANCORANDOM, [place], GENTPEAK, GENTRANDOM, TOBRATROUGH, TOBRAPEAK, TOBRARND, AMIKACINPEAK, AMIKACINTROU, AMIKACIN,  in the last 72 hours   Microbiology: Recent Results (from the past 720 hour(s))  CULTURE, BLOOD (ROUTINE X 2)     Status: None   Collection Time    06/06/13  6:25 PM      Result Value Ref Range Status   Specimen Description BLOOD RIGHT ANTECUBITAL   Final   Special Requests BOTTLES DRAWN AEROBIC AND ANAEROBIC 5CC   Final   Culture  Setup Time     Final   Value: 06/07/2013 03:29     Performed at [facility]   Culture     Final   Value:        BLOOD CULTURE RECEIVED NO GROWTH TO DATE CULTURE WILL BE HELD FOR 5 DAYS BEFORE ISSUING A FINAL NEGATIVE REPORT     Performed at [facility]   Report Status PENDING   Incomplete  URINE CULTURE     Status: None   Collection Time    06/06/13  6:27 PM      Result Value Ref Range Status   Specimen Description URINE, CLEAN CATCH   Final   Special Requests NONE   Final   Culture  Setup Time     Final   Value: 06/07/2013 04:46     Performed at [place]      Final   Value: >=100,000 COLONIES/ML     Performed at [facility]   Culture     Final   Value: ENTEROCOCCUS SPECIES     Performed at [facility]   Report Status 06/08/2013 FINAL   Final   Organism ID, Bacteria ENTEROCOCCUS SPECIES   Final  CULTURE, BLOOD (ROUTINE X 2)     Status: None   Collection Time    06/06/13  6:49 PM      Result Value Ref Range Status   Specimen Description BLOOD LEFT ANTECUBITAL   Final   Special Requests BOTTLES DRAWN AEROBIC AND ANAEROBIC 4.5CC   Final   Culture  Setup Time     Final   Value: 06/07/2013 03:29     Performed at [facility]   Culture     Final   Value:        BLOOD CULTURE RECEIVED NO GROWTH TO DATE CULTURE WILL BE HELD FOR 5 DAYS BEFORE ISSUING A FINAL NEGATIVE REPORT     Performed at [facility]   Report Status PENDING   Incomplete    Medical History: Past Medical History  Diagnosis Date  . Arthritis   . Diabetes  mellitus   . Heart murmur   . Hypertension   . Hypothyroid   . Prostate cancer 1998 dx  . MDS (myelodysplastic syndrome) 12/2006     hyperplastic on bone marrow bx 2008    Medications:  Anti-infectives   Start     Dose/Rate Route Frequency Ordered Stop   06/08/13 1400  vancomycin (VANCOCIN) 1,250 mg in sodium chloride 0.9 % 250 mL IVPB     1,250 mg 166.7 mL/hr over 90 Minutes Intravenous Every 24 hours 06/08/13 1221     06/08/13 1300  piperacillin-tazobactam (ZOSYN) IVPB 3.375 g     3.375 g 12.5 mL/hr over 240 Minutes Intravenous 3 times per day 06/08/13 1221     06/06/13 2215  cefTRIAXone (ROCEPHIN) 1 g in dextrose 5 % 50 mL IVPB  Status:  Discontinued     1 g 100 mL/hr over 30 Minutes Intravenous Every 24 hours 06/06/13 2213 06/08/13 1126   06/06/13 2000  cefTRIAXone (ROCEPHIN) 1 g in dextrose 5 % 50 mL IVPB     1 g 100 mL/hr over 30 Minutes Intravenous  Once 06/06/13 1954 06/06/13 2113     Assessment: 80 year old male admitted with UTI and Pyelonephritis.  His urine culture  reveals Enterococcus which is not covered by Ceftriaxone.  His antibiotics are being broadened to Zosyn and Vancomycin pending susceptibility results.  Goal of Therapy:  Vancomycin trough level 10-15 mcg/ml  Plan:  Zosyn 3.375gm IV q8h extended infusion Vancomycin 1250mg  IV q12h Monitor renal function Await susceptibilities of urine culture  Legrand Como, Pharm.D., BCPS, AAHIVP Clinical Pharmacist Phone: [phone] or [phone] 06/08/2013, 2:46 PM

## 2013-06-08 NOTE — Progress Notes (Signed)
Subjective:  On bed pan. Tried enema. Does not feel well. Fever.  No Chest pain. No SOB.   Objective:  Vital Signs in the last 24 hours: Temp:  [98.2 F (36.8 C)-101.8 F (38.8 C)] 101.8 F (38.8 C) (03/03 0349) Pulse Rate:  [87-96] 96 (03/03 0349) Resp:  [18-20] 20 (03/03 0349) BP: (102-145)/(56-71) 145/68 mmHg (03/03 0349) SpO2:  [89 %-98 %] 98 % (03/03 0349)  Intake/Output from previous day: 03/02 0701 - 03/03 0700 In: 720 [P.O.:720] Out: 675 [Urine:675]   Physical Exam: General: Well developed, well nourished, in no acute distress. Looks younger than age.  Head:  Normocephalic and atraumatic. Lungs: Clear to auscultation and percussion. Heart: Normal S1 and S2.  No murmur, rubs or gallops.  Abdomen: soft, non-tender, positive bowel sounds. Extremities: No clubbing or cyanosis. No edema. Neurologic: Alert and oriented x 3.    Lab Results:  Recent Labs  06/06/13 1802  WBC 7.4  HGB 12.6*  PLT 147*    Recent Labs  06/06/13 1802  NA 134*  K 4.0  CL 95*  CO2 21  GLUCOSE 139*  BUN 20  CREATININE 0.96    Recent Labs  06/06/13 1825 06/06/13 2110  TROPONINI <0.30 1.53*   Hepatic Function Panel  Recent Labs  06/06/13 1802  PROT 7.7  ALBUMIN 4.1  AST 23  ALT 24  ALKPHOS 83  BILITOT 0.6    Recent Labs  06/07/13 0520  CHOL 102   No results found for this basename: PROTIME,  in the last 72 hours  Imaging: Ct Head Wo Contrast  06/06/2013   CLINICAL DATA:  Weakness and fever. Fever and vomiting. Two recent falls.  EXAM: CT HEAD WITHOUT CONTRAST  TECHNIQUE: Contiguous axial images were obtained from the base of the skull through the vertex without intravenous contrast.  COMPARISON:  01/16/2012 and 09/25/2009  FINDINGS: Ventricles and cisterns are within normal. There is mild age related atrophic change which is stable. There is mild chronic ischemic microvascular disease. There is no mass, mass effect, shift of midline structures or acute  hemorrhage. No evidence of acute infarction. Remaining bones and soft tissues are unremarkable.  IMPRESSION: No acute intracranial findings.  Age related atrophy and chronic ischemic microvascular disease.   Electronically Signed   By: Marin Olp M.D.   On: 06/06/2013 19:29   Dg Chest Port 1 View  (if Code Sepsis Called)  06/06/2013   CLINICAL DATA:  Weakness.  Fever.  Sepsis.  EXAM: PORTABLE CHEST - 1 VIEW  COMPARISON:  DG CHEST 2 VIEW dated 02/15/2013; CT CHEST W/CM dated 10/19/2009  FINDINGS: Cardiomegaly. No evidence of failure. No airspace disease or effusion. Stable nodular density in the left mid chest represents a bone island seen on prior chest CT. Severe right glenohumeral osteoarthritis with high-riding humeral head consistent with chronic rotator cuff tear. Marland Kitchen  IMPRESSION: Cardiomegaly without failure or acute cardiopulmonary disease.   Electronically Signed   By: Dereck Ligas M.D.   On: 06/06/2013 19:10   Dg Abd Portable 1v  06/07/2013   CLINICAL DATA:  Nausea and vomiting  EXAM: PORTABLE ABDOMEN - 1 VIEW  COMPARISON:  10/18/2009  FINDINGS: Nonobstructive bowel gas pattern. Stool volume within normal limits. No evidence of urolithiasis or intra-abdominal mass. Advanced degenerative disc disease of the lumbar spine, with dextroscoliosis.  IMPRESSION: Nonobstructive bowel gas pattern.   Electronically Signed   By: Jorje Guild M.D.   On: 06/07/2013 02:04   Personally viewed.  Telemetry: NSR, Sinus tachy.  Personally viewed.   EKG:  NSR RBBB, LAFB  Cardiac Studies:  ECHO 11/14 - normal EF  Assessment/Plan:  Active Problems:   Type II or unspecified type diabetes mellitus with neurological manifestations, not stated as uncontrolled   MDS (myelodysplastic syndrome)   Chronic constipation   Fever   UTI (lower urinary tract infection)   Pyelonephritis   NSTEMI (non-ST elevated myocardial infarction)   80 year old with pyelonephritis, NSTEMI type 2, MDS, DM.   1) NSTEMI (Type  2)  - No chest pain  - Continue with non invasive mgt.   - ASA. Will add low dose metoprolol 12.5mg  PO BID and would low dose statin, atorvastatin 10mg  PO QD but has listed as allergy.   - Await ECHO from this admit   2) Fever/Pyelonephritis with UTI  - per primary team, abx.   3) MDS  - stable  4) DM  - ISS   [name], [name] 06/08/2013, 9:53 AM

## 2013-06-09 DIAGNOSIS — I369 Nonrheumatic tricuspid valve disorder, unspecified: Secondary | ICD-10-CM

## 2013-06-09 DIAGNOSIS — I10 Essential (primary) hypertension: Secondary | ICD-10-CM

## 2013-06-09 LAB — CBC
HCT: 25.8 % — ABNORMAL LOW (ref 39.0–52.0)
HEMOGLOBIN: 9.1 g/dL — AB (ref 13.0–17.0)
MCH: 30.3 pg (ref 26.0–34.0)
MCHC: 35.3 g/dL (ref 30.0–36.0)
MCV: 86 fL (ref 78.0–100.0)
Platelets: DECREASED 10*3/uL (ref 150–400)
RBC: 3 MIL/uL — AB (ref 4.22–5.81)
RDW: 13.8 % (ref 11.5–15.5)
WBC: 2.8 10*3/uL — ABNORMAL LOW (ref 4.0–10.5)

## 2013-06-09 LAB — GLUCOSE, CAPILLARY
GLUCOSE-CAPILLARY: 132 mg/dL — AB (ref 70–99)
GLUCOSE-CAPILLARY: 134 mg/dL — AB (ref 70–99)

## 2013-06-09 LAB — BASIC METABOLIC PANEL
BUN: 19 mg/dL (ref 6–23)
CO2: 25 meq/L (ref 19–32)
Calcium: 7.9 mg/dL — ABNORMAL LOW (ref 8.4–10.5)
Chloride: 102 mEq/L (ref 96–112)
Creatinine, Ser: 0.88 mg/dL (ref 0.50–1.35)
GFR calc Af Amer: 83 mL/min — ABNORMAL LOW (ref >90)
GFR, EST NON AFRICAN AMERICAN: 72 mL/min — AB (ref >90)
Glucose, Bld: 157 mg/dL — ABNORMAL HIGH (ref 70–99)
POTASSIUM: 4.3 meq/L (ref 3.7–5.3)
SODIUM: 137 meq/L (ref 137–147)

## 2013-06-09 MED ORDER — AMOXICILLIN 500 MG PO CAPS: 500.0000 mg | ORAL_CAPSULE | Freq: Three times a day (TID) | ORAL | Status: DC

## 2013-06-09 MED ORDER — CARVEDILOL 3.125 MG PO TABS: 3.1250 mg | ORAL_TABLET | Freq: Two times a day (BID) | ORAL | Status: DC

## 2013-06-09 MED ORDER — LOSARTAN POTASSIUM 25 MG PO TABS
25.0000 mg | ORAL_TABLET | Freq: Every day | ORAL | Status: DC
  Filled 2013-06-09: qty 1

## 2013-06-09 MED ORDER — AMOXICILLIN 500 MG PO CAPS
500.0000 mg | ORAL_CAPSULE | Freq: Three times a day (TID) | ORAL | Status: DC
  Administered 2013-06-09: 500 mg via ORAL
  Filled 2013-06-09 (×3): qty 1

## 2013-06-09 MED ORDER — ACETAMINOPHEN 325 MG PO TABS: 650.0000 mg | ORAL_TABLET | Freq: Four times a day (QID) | ORAL | Status: DC | PRN

## 2013-06-09 MED ORDER — ASPIRIN 81 MG PO TBEC: 81.0000 mg | DELAYED_RELEASE_TABLET | Freq: Every day | ORAL | Status: DC

## 2013-06-09 MED ORDER — DOCUSATE SODIUM 100 MG PO CAPS: 100.0000 mg | ORAL_CAPSULE | Freq: Two times a day (BID) | ORAL | Status: DC

## 2013-06-09 MED ORDER — LOSARTAN POTASSIUM 25 MG PO TABS: 25.0000 mg | ORAL_TABLET | Freq: Every day | ORAL | Status: DC

## 2013-06-09 NOTE — Progress Notes (Signed)
Report called to Olivia Mackie, RN at [facility] RN denied any questions at this time.   Loni Muse, RN

## 2013-06-09 NOTE — Progress Notes (Addendum)
Patient: [name] [name] / Admit Date: 06/06/2013 / Date of Encounter: 06/09/2013, 9:13 AM   Subjective  Feels great. Wants to go home. No CP or SOB.  Objective   Telemetry: NSR  Physical Exam: Blood pressure 108/66, pulse 77, temperature 98.2 F (36.8 C), temperature source Oral, resp. rate 18, height 5\' 9"  (1.753 m), weight 154 lb 8.7 oz (70.1 kg), SpO2 100.00%. General: Well developed, well nourished WM elderly in no acute distress. Head: Normocephalic, atraumatic, sclera non-icteric, no xanthomas, nares are without discharge. Neck: Negative for carotid bruits. JVP not elevated. Lungs: Clear bilaterally to auscultation without wheezes, rales, or rhonchi. Breathing is unlabored. Heart: RRR S1 S2 without murmurs, rubs, or gallops.  Abdomen: Soft, non-tender, non-distended with normoactive bowel sounds. No rebound/guarding. Extremities: No clubbing or cyanosis. No edema. Distal pedal pulses are 2+ and equal bilaterally. Neuro: Alert and oriented X 3. Moves all extremities spontaneously. Psych:  Responds to questions appropriately with a normal affect.   Intake/Output Summary (Last 24 hours) at 06/09/13 0913 Last data filed at 06/09/13 0804  Gross per 24 hour  Intake    920 ml  Output    501 ml  Net    419 ml    Inpatient Medications:  . aspirin EC  81 mg Oral Daily  . carvedilol  3.125 mg Oral BID WC  . diltiazem  120 mg Oral Daily  . docusate sodium  100 mg Oral BID  . enoxaparin (LOVENOX) injection  40 mg Subcutaneous QHS  . insulin aspart  0-5 Units Subcutaneous QHS  . insulin aspart  0-9 Units Subcutaneous TID WC  . levothyroxine  125 mcg Oral QAC breakfast  . piperacillin-tazobactam (ZOSYN)  IV  3.375 g Intravenous 3 times per day  . polyethylene glycol  17 g Oral BID  . sodium chloride  3 mL Intravenous Q12H  . vancomycin  1,250 mg Intravenous Q24H   Infusions:    Labs:  Recent Labs  06/06/13 1802 06/09/13 0355  NA 134* 137  K 4.0 4.3  CL 95* 102  CO2 21  25  GLUCOSE 139* 157*  BUN 20 19  CREATININE 0.96 0.88  CALCIUM 9.4 7.9*    Recent Labs  06/06/13 1802  AST 23  ALT 24  ALKPHOS 83  BILITOT 0.6  PROT 7.7  ALBUMIN 4.1    Recent Labs  06/06/13 1802 06/09/13 0355  WBC 7.4 2.8*  NEUTROABS 5.3  --   HGB 12.6* 9.1*  HCT 36.3* 25.8*  MCV 85.8 86.0  PLT 147* PLATELET CLUMPS NOTED ON SMEAR, COUNT APPEARS DECREASED    Recent Labs  06/06/13 1825 06/06/13 2110  TROPONINI <0.30 1.53*   No components found with this basename: POCBNP,   Recent Labs  06/06/13 2230  HGBA1C 6.4*     Radiology/Studies:  Ct Head Wo Contrast  06/06/2013   CLINICAL DATA:  Weakness and fever. Fever and vomiting. Two recent falls.  EXAM: CT HEAD WITHOUT CONTRAST  TECHNIQUE: Contiguous axial images were obtained from the base of the skull through the vertex without intravenous contrast.  COMPARISON:  01/16/2012 and 09/25/2009  FINDINGS: Ventricles and cisterns are within normal. There is mild age related atrophic change which is stable. There is mild chronic ischemic microvascular disease. There is no mass, mass effect, shift of midline structures or acute hemorrhage. No evidence of acute infarction. Remaining bones and soft tissues are unremarkable.  IMPRESSION: No acute intracranial findings.  Age related atrophy and chronic ischemic microvascular disease.  Electronically Signed   By: Marin Olp M.D.   On: 06/06/2013 19:29   Dg Chest Port 1 View  (if Code Sepsis Called)  06/06/2013   CLINICAL DATA:  Weakness.  Fever.  Sepsis.  EXAM: PORTABLE CHEST - 1 VIEW  COMPARISON:  DG CHEST 2 VIEW dated 02/15/2013; CT CHEST W/CM dated 10/19/2009  FINDINGS: Cardiomegaly. No evidence of failure. No airspace disease or effusion. Stable nodular density in the left mid chest represents a bone island seen on prior chest CT. Severe right glenohumeral osteoarthritis with high-riding humeral head consistent with chronic rotator cuff tear. Marland Kitchen  IMPRESSION: Cardiomegaly without  failure or acute cardiopulmonary disease.   Electronically Signed   By: Dereck Ligas M.D.   On: 06/06/2013 19:10   Dg Abd Portable 1v  06/07/2013   CLINICAL DATA:  Nausea and vomiting  EXAM: PORTABLE ABDOMEN - 1 VIEW  COMPARISON:  10/18/2009  FINDINGS: Nonobstructive bowel gas pattern. Stool volume within normal limits. No evidence of urolithiasis or intra-abdominal mass. Advanced degenerative disc disease of the lumbar spine, with dextroscoliosis.  IMPRESSION: Nonobstructive bowel gas pattern.   Electronically Signed   By: Jorje Guild M.D.   On: 06/07/2013 02:04     Assessment and Plan  1. Fever/pyelonephritis with UTI - per IM. 2. NSTEMI type II, peak troponin 1.53 - felt due to demand ischemia in setting of pyelo. Continue ASA if OK with primary team (Hgb decreased but has MDS). Continue Coreg. H/o statin intolerance. Await echo. Have asked nursing to call dept to see if this can be done today. No anginal sx. 3. MDS - per IM.  4. Diabetes mellitus - a1c controlled at 6.4.  Signed, Melina Copa PA-C  Agree with above. Personally seen and examined.  ECHO read pending.  Non invasive mgt.  Candee Furbish, MD   ADDENDUM: ECHO with EF 35% has stress induced cardiomyopathy appearance (basilar sparing of contraction)  Will add losartan 25mg  PO QD Continue coreg low dose Will stop diltiazem 120.   Hopefully with time EF will improve. He is asymptomatic.  OK for DC.

## 2013-06-09 NOTE — Progress Notes (Signed)
Informed patients daughter< Juliann Pulse, of patients anticipated discharge to [facility] room 304.   Loni Muse, RN

## 2013-06-09 NOTE — Progress Notes (Addendum)
Clinical Social Worker facilitated patient discharge by contacting the patient, family and [place]. Patient and patient's daughter agreeable to this plan and arranging transport via EMS . CSW will sign off, as social work intervention is no longer needed.  Jeanette Caprice, MSW, [place]

## 2013-06-09 NOTE — Progress Notes (Signed)
Clinical Social Work Department CLINICAL SOCIAL WORK PLACEMENT NOTE 06/09/2013  Patient:  MINARD, MILLIRONS  Account Number:  [id number] Admit date:  06/06/2013  Clinical Social Worker:  Megan Salon  Date/time:  06/08/2013 02:45 PM  Clinical Social Work is seeking post-discharge placement for this patient at the following level of care:   [place]   (*CSW will update this form in Epic as items are completed)   06/08/2013  Patient/family provided with [place] Department of Clinical Social Work's list of facilities offering this level of care within the geographic area requested by the patient (or if unable, by the patient's family).  06/08/2013  Patient/family informed of their freedom to choose among providers that offer the needed level of care, that participate in Medicare, Medicaid or managed care program needed by the patient, have an available bed and are willing to accept the patient.  06/08/2013  Patient/family informed of [facility]' ownership interest in [facility], as well as of the fact that they are under no obligation to receive care at this facility.  PASARR submitted to EDS on 06/08/2013 PASARR number received from EDS on 06/08/2013  FL2 transmitted to all facilities in geographic area requested by pt/family on  06/08/2013 FL2 transmitted to all facilities within larger geographic area on   Patient informed that his/her managed care company has contracts with or will negotiate with  certain facilities, including the following:     Patient/family informed of bed offers received:  06/09/2013 Patient chooses bed at [facility], [place] Physician recommends and patient chooses bed at    Patient to be transferred to [facility] on  06/09/2013 Patient to be transferred to facility by EMS  The following physician request were entered in Epic:   Additional Comments:  Jeanette Caprice, MSW,  [place]

## 2013-06-09 NOTE — Progress Notes (Signed)
ANTIBIOTIC CONSULT NOTE - INITIAL  Pharmacy Consult for amoxicillin Indication: Enterococcal UTI with Pyelonephritis  Allergies  Allergen Reactions  . Statins     Age>90    Patient Measurements: Height: 5\' 9"  (175.3 cm) Weight: 154 lb 8.7 oz (70.1 kg) IBW/kg (Calculated) : 70.7  Vital Signs: Temp: 98.2 F (36.8 C) (03/04 0510) Temp src: Oral (03/04 0510) BP: 108/66 mmHg (03/04 0510) Pulse Rate: 77 (03/04 0510) Intake/Output from previous day: 03/03 0701 - 03/04 0700 In: 960 [P.O.:960] Out: 500 [Urine:500] Intake/Output from this shift: Total I/O In: 200 [P.O.:200] Out: 1 [Stool:1]  Labs:  Recent Labs  06/06/13 1802 06/09/13 0355  WBC 7.4 2.8*  HGB 12.6* 9.1*  PLT 147* PLATELET CLUMPS NOTED ON SMEAR, COUNT APPEARS DECREASED  CREATININE 0.96 0.88   Estimated Creatinine Clearance: 52 ml/min (by C-G formula based on Cr of 0.88). No results found for this basename: VANCOTROUGH, VANCOPEAK, VANCORANDOM, [place], GENTPEAK, GENTRANDOM, TOBRATROUGH, TOBRAPEAK, TOBRARND, AMIKACINPEAK, AMIKACINTROU, AMIKACIN,  in the last 72 hours   Microbiology: Recent Results (from the past 720 hour(s))  CULTURE, BLOOD (ROUTINE X 2)     Status: None   Collection Time    06/06/13  6:25 PM      Result Value Ref Range Status   Specimen Description BLOOD RIGHT ANTECUBITAL   Final   Special Requests BOTTLES DRAWN AEROBIC AND ANAEROBIC 5CC   Final   Culture  Setup Time     Final   Value: 06/07/2013 03:29     Performed at [facility]   Culture     Final   Value:        BLOOD CULTURE RECEIVED NO GROWTH TO DATE CULTURE WILL BE HELD FOR 5 DAYS BEFORE ISSUING A FINAL NEGATIVE REPORT     Performed at [facility]   Report Status PENDING   Incomplete  URINE CULTURE     Status: None   Collection Time    06/06/13  6:27 PM      Result Value Ref Range Status   Specimen Description URINE, CLEAN CATCH   Final   Special Requests NONE   Final   Culture  Setup Time     Final   Value: 06/07/2013 04:46     Performed at [facility]     Final   Value: >=100,000 COLONIES/ML     Performed at [facility]   Culture     Final   Value: ENTEROCOCCUS SPECIES     Performed at [facility]   Report Status 06/08/2013 FINAL   Final   Organism ID, Bacteria ENTEROCOCCUS SPECIES   Final  CULTURE, BLOOD (ROUTINE X 2)     Status: None   Collection Time    06/06/13  6:49 PM      Result Value Ref Range Status   Specimen Description BLOOD LEFT ANTECUBITAL   Final   Special Requests BOTTLES DRAWN AEROBIC AND ANAEROBIC 4.5CC   Final   Culture  Setup Time     Final   Value: 06/07/2013 03:29     Performed at [facility]   Culture     Final   Value:        BLOOD CULTURE RECEIVED NO GROWTH TO DATE CULTURE WILL BE HELD FOR 5 DAYS BEFORE ISSUING A FINAL NEGATIVE REPORT     Performed at [facility]   Report Status PENDING   Incomplete    Medical History: Past Medical History  Diagnosis Date  . Arthritis   . Diabetes mellitus   . Heart murmur   . Hypertension   . Hypothyroid   . Prostate cancer 1998 dx  . MDS (myelodysplastic syndrome) 12/2006     hyperplastic on bone marrow bx 2008    Medications:  Anti-infectives   Start     Dose/Rate Route Frequency Ordered Stop   06/08/13 1400  vancomycin (VANCOCIN) 1,250 mg in sodium chloride 0.9 % 250 mL IVPB  Status:  Discontinued     1,250 mg 166.7 mL/hr over 90 Minutes Intravenous Every 24 hours 06/08/13 1221 06/09/13 0952   06/08/13 1300  piperacillin-tazobactam (ZOSYN) IVPB 3.375 g  Status:  Discontinued     3.375 g 12.5 mL/hr over 240 Minutes Intravenous 3 times per day 06/08/13 1221 06/09/13 0952   06/06/13 2215  cefTRIAXone (ROCEPHIN) 1 g in dextrose 5 % 50 mL IVPB  Status:  Discontinued     1 g 100 mL/hr over 30 Minutes Intravenous Every 24 hours 06/06/13 2213 06/08/13 1126   06/06/13 2000  cefTRIAXone (ROCEPHIN) 1 g in dextrose 5 % 50 mL IVPB     1 g 100 mL/hr over  30 Minutes Intravenous  Once 06/06/13 1954 06/06/13 2113     Assessment: 80 year old male admitted with UTI and Pyelonephritis.  He was started on rocephin but it does not cover enterococcus so it was changed to vanc/zosyn. Sens are back today and it's sens to ampicillin. After discussion on rounds, will de-escalate to amoxicillin.    Plan:   Dc vanc/zosyn Start amoxicillin 500mg  PO TID x 9 more days Rx will sign off

## 2013-06-09 NOTE — Progress Notes (Signed)
Echocardiogram 2D Echocardiogram has been performed.  Tylyn Stankovich 06/09/2013, 10:23 AM

## 2013-06-09 NOTE — Discharge Summary (Addendum)
Physician Discharge Summary  [name] [name] [id number] DOB: 07-02-1917 DOA: 06/06/2013  PCP: Gwendolyn Grant, MD  Admit date: 06/06/2013 Discharge date: 06/09/2013  Time spent: Less than 30 minutes  Recommendations for Outpatient Follow-up:  1. MD at [facility] in 3 days, to be seen with repeat labs (CBC & BMP). Follow final blood cultures that were sent from the hospital. 2. Dr. Candee Furbish, Cardiology 3. Dr. Gwendolyn Grant, PCP  Discharge Diagnoses:  Active Problems:   Type II or unspecified type diabetes mellitus with neurological manifestations, not stated as uncontrolled   MDS (myelodysplastic syndrome)   Chronic constipation   Fever   UTI (lower urinary tract infection)   Pyelonephritis   NSTEMI (non-ST elevated myocardial infarction)   Discharge Condition: Improved & Stable  Diet recommendation: Heart healthy and diabetic diet.  Filed Weights   06/06/13 2359  Weight: 70.1 kg (154 lb 8.7 oz)    History of present illness:  80 year old male patient with history of type II DM, hypertension, hypothyroid, prostate cancer and ? Myelodysplastic syndrome, was admitted on 06/06/13 with complaints of fever, chills and dysuria. He had associated nausea and vomiting. In the ED, he had a fever of 102.39F and was admitted for management of UTI/pyelonephritis.  Hospital Course:   1. Enterococcus Acute UTI/pyelonephritis: He was admitted and started empirically on IV Rocephin and despite which he continued to spike temperatures and urine cultures on 06/08/13 showed enterococcus. At this time his antibiotic spectrum was broadened to IV vancomycin and Zosyn. Patient has defervesced in the last 24 hours and denies complaints including dysuria. He will be transitioned to oral amoxicillin to complete a total 10 days treatment. His blood cultures are negative to date. Renal ultrasound without acute findings (as discussed with Dr. Kris Hartmann, Radiology). 2. NSTEMI type II: Peak troponin  1.53. Cardiology consulted and felt that this was due to demand ischemia in the setting of acute infection. Continue aspirin. Coreg was added. History of statin intolerance and hence was not started. Cardiology recommends noninvasive management. Discussed with cardiology who has cleared him for discharge to SNF pending echo results. 3. New cardiomyopathy/likely stress-induced cardiomyopathy: Discussed with cardiology regarding echo results (echo results as below). Advised DC Cardizem, start low dose losartan, continue beta blockers and aspirin. Cardiology will arrange outpatient followup. 4. Questionable myelodysplastic syndrome/chronic leukopenia/anemia: Patient follows with hematology/oncology and as per the note on 12/24, the diagnosis of myelodysplastic syndrome is inconclusive but with his advanced age and relative lack of symptoms, not pursuing repeat bone marrow aspirate or biopsy. His WBC counts tend to fluctuate. Anemia is likely of chronic disease and patient gives no history of bleeding at this time. Platelet counts also fluctuate and platelet clumping has been observed in the past. Recommend follow CBC in the next 2 days at [facility]. 5. History of prostate cancer: Outpatient followup. 6. Type II DM: Hemoglobin A1c 6.4. Continue home dose of metformin. 7. Constipation: Adjusted his bowel regimen. Patient states that he had a large BM today. 8. History of hypothyroid: Continue Synthroid 9. ? Mild confusion:? Element of dementia complicated by acute infection and hospitalization. No focal deficits. Monitor closely. 10. Hypertension: Reasonable control.  Consultations:  Cardiology  Procedures:  None    Discharge Exam:  Complaints:  Patient denies complaints. Denies pain or dysuria. States that he had a large BM today. No chest pain. Patient was not confused during MDs visit but apparently was slightly confused when social worker talked to him.  Filed Vitals:   06/08/13  0900 06/08/13 1500  06/08/13 2041 06/09/13 0510  BP: 123/73 156/67 124/63 108/66  Pulse: 94 97 81 77  Temp:  97.6 F (36.4 C) 98.2 F (36.8 C) 98.2 F (36.8 C)  TempSrc:  Oral Oral Oral  Resp:  20 18 18   Height:      Weight:      SpO2:  97% 94% 100%    General exam: Pleasant elderly male lying comfortably in bed. Respiratory system: Clear. No increased work of breathing. Cardiovascular system: S1 & S2 heard, RRR. No JVD, murmurs, gallops, clicks or pedal edema. Telemetry: Sinus rhythm with first-degree AV block. Gastrointestinal system: Abdomen is nondistended, soft and nontender. Normal bowel sounds heard. Central nervous system: Alert and oriented. No focal neurological deficits. Extremities: Symmetric 5 x 5 power.  Discharge Instructions      Discharge Orders   Future Appointments Provider Department Dept Phone   09/29/2013 9:00 AM [facility] [phone]   09/29/2013 9:30 AM Heath Lark, MD [facility] [phone]   11/23/2013 8:30 AM Rowe Clack, MD [place] [phone]   Future Orders Complete By Expires   Call MD for:  severe uncontrolled pain  As directed    Call MD for:  temperature >100.4  As directed    Call MD for:  As directed    Comments:     Confusion or altered mental status.   Diet - low sodium heart healthy  As directed    Diet Carb Modified  As directed    Increase activity slowly  As directed        Medication List         acetaminophen 325 MG tablet  Commonly known as:  TYLENOL  Take 2 tablets (650 mg total) by mouth every 6 (six) hours as needed for mild pain (or Fever >/= 101).     amoxicillin 500 MG capsule  Commonly known as:  AMOXIL  Take 1 capsule (500 mg total) by mouth 3 (three) times daily. Discontinue after 06/17/2013 doses.     aspirin 81 MG EC tablet  Take 1 tablet (81 mg total) by mouth daily.     bisacodyl 5 MG EC tablet  Generic  drug:  bisacodyl  Take 5 mg by mouth daily as needed for mild constipation or moderate constipation.     carvedilol 3.125 MG tablet  Commonly known as:  COREG  Take 1 tablet (3.125 mg total) by mouth 2 (two) times daily with a meal.     diltiazem 120 MG tablet  Commonly known as:  CARDIZEM  Take 1 tablet (120 mg total) by mouth daily.     docusate sodium 100 MG capsule  Commonly known as:  COLACE  Take 1 capsule (100 mg total) by mouth 2 (two) times daily.     levothyroxine 125 MCG tablet  Commonly known as:  SYNTHROID, LEVOTHROID  Take 1 tablet (125 mcg total) by mouth daily.     metFORMIN 500 MG tablet  Commonly known as:  GLUCOPHAGE  Take 1 tablet (500 mg total) by mouth daily with breakfast.     polyethylene glycol powder powder  Commonly known as:  GLYCOLAX/MIRALAX  Take 17 g by mouth 2 (two) times daily at 10 AM and 5 PM.       Follow-up Information   Follow up with MD at [facility]. Schedule an appointment as soon as possible for a visit  in 2 days. (To be seen with repeat labs (CBC).)       Schedule an appointment as soon as possible for a visit with Gwendolyn Grant, MD.   Specialty:  Internal Medicine   Contact information:   [address] [place] [address] [phone]       Schedule an appointment as soon as possible for a visit with Candee Furbish, MD.   Specialty:  Cardiology   Contact information:   [id number] [place] [address] [phone]        The results of significant diagnostics from this hospitalization (including imaging, microbiology, ancillary and laboratory) are listed below for reference.    Significant Diagnostic Studies: Ct Head Wo Contrast  06/06/2013   CLINICAL DATA:  Weakness and fever. Fever and vomiting. Two recent falls.  EXAM: CT HEAD WITHOUT CONTRAST  TECHNIQUE: Contiguous axial images were obtained from the base of the skull through the vertex without  intravenous contrast.  COMPARISON:  01/16/2012 and 09/25/2009  FINDINGS: Ventricles and cisterns are within normal. There is mild age related atrophic change which is stable. There is mild chronic ischemic microvascular disease. There is no mass, mass effect, shift of midline structures or acute hemorrhage. No evidence of acute infarction. Remaining bones and soft tissues are unremarkable.  IMPRESSION: No acute intracranial findings.  Age related atrophy and chronic ischemic microvascular disease.   Electronically Signed   By: Marin Olp M.D.   On: 06/06/2013 19:29   Dg Chest Port 1 View  (if Code Sepsis Called)  06/06/2013   CLINICAL DATA:  Weakness.  Fever.  Sepsis.  EXAM: PORTABLE CHEST - 1 VIEW  COMPARISON:  DG CHEST 2 VIEW dated 02/15/2013; CT CHEST W/CM dated 10/19/2009  FINDINGS: Cardiomegaly. No evidence of failure. No airspace disease or effusion. Stable nodular density in the left mid chest represents a bone island seen on prior chest CT. Severe right glenohumeral osteoarthritis with high-riding humeral head consistent with chronic rotator cuff tear. Marland Kitchen  IMPRESSION: Cardiomegaly without failure or acute cardiopulmonary disease.   Electronically Signed   By: Dereck Ligas M.D.   On: 06/06/2013 19:10   Dg Abd Portable 1v  06/07/2013   CLINICAL DATA:  Nausea and vomiting  EXAM: PORTABLE ABDOMEN - 1 VIEW  COMPARISON:  10/18/2009  FINDINGS: Nonobstructive bowel gas pattern. Stool volume within normal limits. No evidence of urolithiasis or intra-abdominal mass. Advanced degenerative disc disease of the lumbar spine, with dextroscoliosis.  IMPRESSION: Nonobstructive bowel gas pattern.   Electronically Signed   By: Jorje Guild M.D.   On: 06/07/2013 02:04    Microbiology: Recent Results (from the past 240 hour(s))  CULTURE, BLOOD (ROUTINE X 2)     Status: None   Collection Time    06/06/13  6:25 PM      Result Value Ref Range Status   Specimen Description BLOOD RIGHT ANTECUBITAL   Final    Special Requests BOTTLES DRAWN AEROBIC AND ANAEROBIC 5CC   Final   Culture  Setup Time     Final   Value: 06/07/2013 03:29     Performed at [facility]   Culture     Final   Value:        BLOOD CULTURE RECEIVED NO GROWTH TO DATE CULTURE WILL BE HELD FOR 5 DAYS BEFORE ISSUING A FINAL NEGATIVE REPORT     Performed at [facility]   Report Status PENDING  Incomplete  URINE CULTURE     Status: None   Collection Time    06/06/13  6:27 PM      Result Value Ref Range Status   Specimen Description URINE, CLEAN CATCH   Final   Special Requests NONE   Final   Culture  Setup Time     Final   Value: 06/07/2013 04:46     Performed at [facility] Count     Final   Value: >=100,000 COLONIES/ML     Performed at [facility]   Culture     Final   Value: ENTEROCOCCUS SPECIES     Performed at [facility]   Report Status 06/08/2013 FINAL   Final   Organism ID, Bacteria ENTEROCOCCUS SPECIES   Final  CULTURE, BLOOD (ROUTINE X 2)     Status: None   Collection Time    06/06/13  6:49 PM      Result Value Ref Range Status   Specimen Description BLOOD LEFT ANTECUBITAL   Final   Special Requests BOTTLES DRAWN AEROBIC AND ANAEROBIC 4.5CC   Final   Culture  Setup Time     Final   Value: 06/07/2013 03:29     Performed at [facility]   Culture     Final   Value:        BLOOD CULTURE RECEIVED NO GROWTH TO DATE CULTURE WILL BE HELD FOR 5 DAYS BEFORE ISSUING A FINAL NEGATIVE REPORT     Performed at [facility]   Report Status PENDING   Incomplete     Labs: Basic Metabolic Panel:  Recent Labs Lab 06/06/13 1802 06/09/13 0355  NA 134* 137  K 4.0 4.3  CL 95* 102  CO2 21 25  GLUCOSE 139* 157*  BUN 20 19  CREATININE 0.96 0.88  CALCIUM 9.4 7.9*   Liver Function Tests:  Recent Labs Lab 06/06/13 1802  AST 23  ALT 24  ALKPHOS 83  BILITOT 0.6  PROT 7.7  ALBUMIN 4.1    Recent Labs Lab 06/06/13 1825  LIPASE 55   No  results found for this basename: AMMONIA,  in the last 168 hours CBC:  Recent Labs Lab 06/06/13 1802 06/09/13 0355  WBC 7.4 2.8*  NEUTROABS 5.3  --   HGB 12.6* 9.1*  HCT 36.3* 25.8*  MCV 85.8 86.0  PLT 147* PLATELET CLUMPS NOTED ON SMEAR, COUNT APPEARS DECREASED   Cardiac Enzymes:  Recent Labs Lab 06/06/13 1825 06/06/13 2110  TROPONINI <0.30 1.53*   BNP: BNP (last 3 results)  Recent Labs  02/15/13 2105  PROBNP 1204.0*   CBG:  Recent Labs Lab 06/08/13 1141 06/08/13 1631 06/08/13 2152 06/09/13 0635 06/09/13 1136  GLUCAP 200* 118* 141* 132* 134*    Additional labs: 1. Fasting lipids: Cholesterol 102, triglycerides 79, HDL 45, LDL 41 and VLDL 16 2. HbA1C: 6.4 3. 2-D echo 06/09/13: Study Conclusions  - Left ventricle: Wall motion abnormality is new since 02/2013. I can not tell if this wall abnormality is due to MI or Takotsubo. There is no obvious apical clot. Good motion at base, but hypokinesis of mid/apical inferior, mid/apical anterior, mid inferoseptal, mid anteroseptal, apical septal, mid inferolateral, mid anterolateral, apical lateral segments. The cavity size was normal. Wall thickness was increased in a pattern of mild LVH. The estimated ejection fraction was 35%. Findings consistent with left ventricular diastolic dysfunction. - Aortic valve: Sclerosis without stenosis. No significant regurgitation. - Right ventricle: The  cavity size was normal. Systolic function was mildly reduced. - Pulmonary arteries: PA peak pressure: 43mm Hg (S).     Signed:  Vernell Leep, MD, FACP, FHM. [facility] Pager [phone]  If 7PM-7AM, please contact night-coverage [email or web address] Password [facility] 06/09/2013, 12:57 PM

## 2013-06-13 LAB — CULTURE, BLOOD (ROUTINE X 2)
CULTURE: NO GROWTH
Culture: NO GROWTH

## 2013-06-15 NOTE — ED Provider Notes (Signed)
Medical screening examination/treatment/procedure(s) were conducted as a shared visit with non-physician practitioner(s) and myself.  I personally evaluated the patient during the encounter.   EKG Interpretation   Date/Time:  Sunday June 06 2013 20:02:24 EST Ventricular Rate:  85 PR Interval:  230 QRS Duration: 157 QT Interval:  409 QTC Calculation: 486 R Axis:   -58 Text Interpretation:  Sinus rhythm Prolonged PR interval RBBB and LAFB ED  PHYSICIAN INTERPRETATION AVAILABLE IN [facility] Confirmed by TEST,  Record ([id number]) on 06/08/2013 9:29:58 AM      Patient examined independently. Care studies reviewed PA.  I agree with the workup, impression, treatment, and disposition.  Tanna Furry, MD 06/15/13 [phone]

## 2013-06-28 ENCOUNTER — Other Ambulatory Visit: Payer: Self-pay | Admitting: Internal Medicine

## 2013-06-28 DIAGNOSIS — N390 Urinary tract infection, site not specified: Secondary | ICD-10-CM

## 2013-06-28 DIAGNOSIS — E119 Type 2 diabetes mellitus without complications: Secondary | ICD-10-CM

## 2013-06-28 DIAGNOSIS — I10 Essential (primary) hypertension: Secondary | ICD-10-CM

## 2013-06-28 DIAGNOSIS — M6281 Muscle weakness (generalized): Secondary | ICD-10-CM

## 2013-07-07 LAB — HM DIABETES EYE EXAM

## 2013-07-08 ENCOUNTER — Encounter: Payer: Self-pay | Admitting: Internal Medicine

## 2013-07-08 ENCOUNTER — Other Ambulatory Visit (INDEPENDENT_AMBULATORY_CARE_PROVIDER_SITE_OTHER): Payer: Medicare Other

## 2013-07-08 ENCOUNTER — Ambulatory Visit (INDEPENDENT_AMBULATORY_CARE_PROVIDER_SITE_OTHER): Payer: Medicare Other | Admitting: Internal Medicine

## 2013-07-08 VITALS — BP 102/52 | HR 55 | Temp 97.6°F | Wt 153.1 lb

## 2013-07-08 DIAGNOSIS — R42 Dizziness and giddiness: Secondary | ICD-10-CM

## 2013-07-08 DIAGNOSIS — I214 Non-ST elevation (NSTEMI) myocardial infarction: Secondary | ICD-10-CM

## 2013-07-08 DIAGNOSIS — D469 Myelodysplastic syndrome, unspecified: Secondary | ICD-10-CM

## 2013-07-08 DIAGNOSIS — N390 Urinary tract infection, site not specified: Secondary | ICD-10-CM

## 2013-07-08 LAB — URINALYSIS, ROUTINE W REFLEX MICROSCOPIC
BILIRUBIN URINE: NEGATIVE
Hgb urine dipstick: NEGATIVE
Ketones, ur: NEGATIVE
Leukocytes, UA: NEGATIVE
NITRITE: NEGATIVE
Specific Gravity, Urine: 1.02 (ref 1.000–1.030)
TOTAL PROTEIN, URINE-UPE24: NEGATIVE
Urine Glucose: NEGATIVE
Urobilinogen, UA: 0.2 (ref 0.0–1.0)
pH: 6.5 (ref 5.0–8.0)

## 2013-07-08 LAB — CBC WITH DIFFERENTIAL/PLATELET
BASOS PCT: 0.2 % (ref 0.0–3.0)
Basophils Absolute: 0 10*3/uL (ref 0.0–0.1)
Eosinophils Absolute: 0.1 10*3/uL (ref 0.0–0.7)
Eosinophils Relative: 1.1 % (ref 0.0–5.0)
HCT: 35.5 % — ABNORMAL LOW (ref 39.0–52.0)
Hemoglobin: 11.7 g/dL — ABNORMAL LOW (ref 13.0–17.0)
LYMPHS PCT: 43.8 % (ref 12.0–46.0)
Lymphs Abs: 2.3 10*3/uL (ref 0.7–4.0)
MCHC: 33 g/dL (ref 30.0–36.0)
MCV: 89.2 fl (ref 78.0–100.0)
MONO ABS: 0.5 10*3/uL (ref 0.1–1.0)
Monocytes Relative: 9.2 % (ref 3.0–12.0)
NEUTROS PCT: 45.7 % (ref 43.0–77.0)
Neutro Abs: 2.4 10*3/uL (ref 1.4–7.7)
PLATELETS: 162 10*3/uL (ref 150.0–400.0)
RBC: 3.98 Mil/uL — AB (ref 4.22–5.81)
RDW: 14.7 % — ABNORMAL HIGH (ref 11.5–14.6)
WBC: 5.2 10*3/uL (ref 4.5–10.5)

## 2013-07-08 LAB — BASIC METABOLIC PANEL
BUN: 26 mg/dL — AB (ref 6–23)
CALCIUM: 9.8 mg/dL (ref 8.4–10.5)
CO2: 27 mEq/L (ref 19–32)
CREATININE: 1.2 mg/dL (ref 0.4–1.5)
Chloride: 102 mEq/L (ref 96–112)
GFR: 59.35 mL/min — ABNORMAL LOW (ref >60.00)
Glucose, Bld: 120 mg/dL — ABNORMAL HIGH (ref 70–99)
Potassium: 4.5 mEq/L (ref 3.5–5.1)
Sodium: 138 mEq/L (ref 135–145)

## 2013-07-08 MED ORDER — CARVEDILOL 3.125 MG PO TABS: 3.1250 mg | ORAL_TABLET | Freq: Two times a day (BID) | ORAL | Status: DC

## 2013-07-08 MED ORDER — LOSARTAN POTASSIUM 25 MG PO TABS: 25.0000 mg | ORAL_TABLET | Freq: Every day | ORAL | Status: DC

## 2013-07-08 MED ORDER — MECLIZINE HCL 25 MG PO TABS: 25.0000 mg | ORAL_TABLET | Freq: Three times a day (TID) | ORAL | Status: DC | PRN

## 2013-07-08 NOTE — Assessment & Plan Note (Signed)
hosp 06/06/13 for UTI/PN Completed antibiotics  Recheck UA now

## 2013-07-08 NOTE — Progress Notes (Signed)
Subjective:    Patient ID: [name] [name], male    DOB: 1917-05-19, 80 y.o.   MRN: [id number]  HPI  Patient here for follow up -hospitalized March 1-4 for UTI/PN Non-ST elevation MI because of acute infection stress from demand ischemia At SNF x 2 weeks, now home >1 week Also reviewed chronic medical issues and interval medical events  Past Medical History  Diagnosis Date  . Arthritis   . Diabetes mellitus   . Heart murmur   . Hypertension   . Hypothyroid   . Prostate cancer 1998 dx  . MDS (myelodysplastic syndrome) 12/2006     hyperplastic on bone marrow bx 2008    Review of Systems  Constitutional: Negative for fever, fatigue and unexpected weight change.  Respiratory: Negative for cough and shortness of breath.   Cardiovascular: Negative for chest pain and leg swelling.  Neurological: Positive for dizziness (recurrent, current episode onset this AM). Negative for seizures, facial asymmetry, speech difficulty, weakness, light-headedness and headaches.       Objective:   Physical Exam  BP 102/52  Pulse 55  Temp(Src) 97.6 F (36.4 C) (Oral)  Wt 153 lb 1.9 oz (69.455 kg)  SpO2 98% Wt Readings from Last 3 Encounters:  07/08/13 153 lb 1.9 oz (69.455 kg)  06/06/13 154 lb 8.7 oz (70.1 kg)  05/26/13 157 lb (71.215 kg)   Constitutional: he appears well-developed and well-nourished. No distress. Dtr at side Neck: Normal range of motion. Neck supple. No JVD present. No thyromegaly present.  Cardiovascular: Normal rate, regular rhythm and normal heart sounds.  No murmur heard. No BLE edema. Pulmonary/Chest: Effort normal and breath sounds normal. No respiratory distress. he has no wheezes.  Neurologic: Psychiatric: he has a normal mood and affect. His behavior is normal. Judgment and thought content normal.   Lab Results  Component Value Date   WBC 2.8* 06/09/2013   HGB 9.1* 06/09/2013   HCT 25.8* 06/09/2013   PLT PLATELET CLUMPS NOTED ON SMEAR, COUNT APPEARS DECREASED  06/09/2013   GLUCOSE 157* 06/09/2013   CHOL 102 06/07/2013   TRIG 79 06/07/2013   HDL 45 06/07/2013   LDLDIRECT 89.1 11/17/2012   LDLCALC 41 06/07/2013   ALT 24 06/06/2013   AST 23 06/06/2013   NA 137 06/09/2013   K 4.3 06/09/2013   CL 102 06/09/2013   CREATININE 0.88 06/09/2013   BUN 19 06/09/2013   CO2 25 06/09/2013   TSH 0.646 02/16/2013   PSA 0.17 03/31/2013   HGBA1C 6.4* 06/06/2013   MICROALBUR 29.0* 05/26/2013    Ct Head Wo Contrast  06/06/2013   CLINICAL DATA:  Weakness and fever. Fever and vomiting. Two recent falls.  EXAM: CT HEAD WITHOUT CONTRAST  TECHNIQUE: Contiguous axial images were obtained from the base of the skull through the vertex without intravenous contrast.  COMPARISON:  01/16/2012 and 09/25/2009  FINDINGS: Ventricles and cisterns are within normal. There is mild age related atrophic change which is stable. There is mild chronic ischemic microvascular disease. There is no mass, mass effect, shift of midline structures or acute hemorrhage. No evidence of acute infarction. Remaining bones and soft tissues are unremarkable.  IMPRESSION: No acute intracranial findings.  Age related atrophy and chronic ischemic microvascular disease.   Electronically Signed   By: Marin Olp M.D.   On: 06/06/2013 19:29   US Renal  06/09/2013   CLINICAL DATA:  Pyelonephritis. Urinary tract infection. Diabetes and hypertension. Renal insufficiency.  EXAM: RENAL/URINARY TRACT ULTRASOUND COMPLETE  COMPARISON:  None.  FINDINGS: Right Kidney:  Length: 10.7 cm. Echogenicity within normal limits. No mass or hydronephrosis visualized.  Left Kidney:  Length: 11.2 cm. Echogenicity within normal limits. No mass or hydronephrosis visualized.  Bladder:  Appears normal for degree of bladder distention.  IMPRESSION: Negative. No evidence of hydronephrosis, or other renal or bladder abnormality.   Electronically Signed   By: Earle Gell M.D.   On: 06/09/2013 13:28   Dg Chest Port 1 View  (if Code Sepsis Called)  06/06/2013   CLINICAL  DATA:  Weakness.  Fever.  Sepsis.  EXAM: PORTABLE CHEST - 1 VIEW  COMPARISON:  DG CHEST 2 VIEW dated 02/15/2013; CT CHEST W/CM dated 10/19/2009  FINDINGS: Cardiomegaly. No evidence of failure. No airspace disease or effusion. Stable nodular density in the left mid chest represents a bone island seen on prior chest CT. Severe right glenohumeral osteoarthritis with high-riding humeral head consistent with chronic rotator cuff tear. Marland Kitchen  IMPRESSION: Cardiomegaly without failure or acute cardiopulmonary disease.   Electronically Signed   By: Dereck Ligas M.D.   On: 06/06/2013 19:10   Dg Abd Portable 1v  06/07/2013   CLINICAL DATA:  Nausea and vomiting  EXAM: PORTABLE ABDOMEN - 1 VIEW  COMPARISON:  10/18/2009  FINDINGS: Nonobstructive bowel gas pattern. Stool volume within normal limits. No evidence of urolithiasis or intra-abdominal mass. Advanced degenerative disc disease of the lumbar spine, with dextroscoliosis.  IMPRESSION: Nonobstructive bowel gas pattern.   Electronically Signed   By: Jorje Guild M.D.   On: 06/07/2013 02:04       Assessment & Plan:   Problem List Items Addressed This Visit   Dizziness     Recurrent, chronic symptoms -refuses to use balance aide like walker or cane resume meclizine tid prn - previously helped for transient period    Relevant Orders      Basic metabolic panel   MDS (myelodysplastic syndrome)      Hospitalization 02/2013 with progression of pancytopenia previously stable values, no treatment changes recommended Check CBC now and q3-34mo - wil follow here, heme as needed if changes Last OV reviewed (03/2013) CBC    Component Value Date/Time   WBC 2.8* 06/09/2013 0355   WBC 3.1* 03/31/2013 0913   WBC 3.1 04/10/2011   RBC 3.00* 06/09/2013 0355   RBC 4.06* 03/31/2013 0913   RBC 3.55* 02/16/2013 1245   HGB 9.1* 06/09/2013 0355   HGB 11.7* 03/31/2013 0913   HCT 25.8* 06/09/2013 0355   HCT 34.3* 03/31/2013 0913   PLT PLATELET CLUMPS NOTED ON SMEAR, COUNT APPEARS  DECREASED 06/09/2013 0355   PLT Clumped Platelets--Appears Adequate 03/31/2013 0913   MCV 86.0 06/09/2013 0355   MCV 84.5 03/31/2013 0913   MCH 30.3 06/09/2013 0355   MCH 28.8 03/31/2013 0913   MCHC 35.3 06/09/2013 0355   MCHC 34.1 03/31/2013 0913   RDW 13.8 06/09/2013 0355   RDW 13.8 03/31/2013 0913   LYMPHSABS 1.5 06/06/2013 1802   LYMPHSABS 1.3 03/31/2013 0913   MONOABS 0.6 06/06/2013 1802   MONOABS 0.2 03/31/2013 0913   EOSABS 0.0 06/06/2013 1802   EOSABS 0.1 03/31/2013 0913   BASOSABS 0.0 06/06/2013 1802   BASOSABS 0.0 03/31/2013 0913      Relevant Orders      CBC with Differential   NSTEMI (non-ST elevated myocardial infarction)     Trop I peak 1.53 06/2013 in setting of UTI/PN hosp Demand ischemia from acute illness Cards eval IP: rec change CCB to ARB and continue  BB and ASA Echo with isch CM - expected to be transient - follow up cards a OP as planned     Relevant Medications      losartan (COZAAR) tablet      carvedilol (COREG) tablet   Other Relevant Orders      Basic metabolic panel   UTI (lower urinary tract infection) - Primary     hosp 06/06/13 for UTI/PN Completed antibiotics  Recheck UA now    Relevant Orders      Urinalysis, Routine w reflex microscopic      Basic metabolic panel

## 2013-07-08 NOTE — Assessment & Plan Note (Signed)
Recurrent, chronic symptoms -refuses to use balance aide like walker or cane resume meclizine tid prn - previously helped for transient period 

## 2013-07-08 NOTE — Assessment & Plan Note (Signed)
Hospitalization 02/2013 with progression of pancytopenia previously stable values, no treatment changes recommended Check CBC now and q3-36mo - wil follow here, heme as needed if changes Last OV reviewed (03/2013) CBC    Component Value Date/Time   WBC 2.8* 06/09/2013 0355   WBC 3.1* 03/31/2013 0913   WBC 3.1 04/10/2011   RBC 3.00* 06/09/2013 0355   RBC 4.06* 03/31/2013 0913   RBC 3.55* 02/16/2013 1245   HGB 9.1* 06/09/2013 0355   HGB 11.7* 03/31/2013 0913   HCT 25.8* 06/09/2013 0355   HCT 34.3* 03/31/2013 0913   PLT PLATELET CLUMPS NOTED ON SMEAR, COUNT APPEARS DECREASED 06/09/2013 0355   PLT Clumped Platelets--Appears Adequate 03/31/2013 0913   MCV 86.0 06/09/2013 0355   MCV 84.5 03/31/2013 0913   MCH 30.3 06/09/2013 0355   MCH 28.8 03/31/2013 0913   MCHC 35.3 06/09/2013 0355   MCHC 34.1 03/31/2013 0913   RDW 13.8 06/09/2013 0355   RDW 13.8 03/31/2013 0913   LYMPHSABS 1.5 06/06/2013 1802   LYMPHSABS 1.3 03/31/2013 0913   MONOABS 0.6 06/06/2013 1802   MONOABS 0.2 03/31/2013 0913   EOSABS 0.0 06/06/2013 1802   EOSABS 0.1 03/31/2013 0913   BASOSABS 0.0 06/06/2013 1802   BASOSABS 0.0 03/31/2013 0913

## 2013-07-08 NOTE — Progress Notes (Signed)
Pre visit review using our clinic review tool, if applicable. No additional management support is needed unless otherwise documented below in the visit note. 

## 2013-07-08 NOTE — Patient Instructions (Signed)
It was good to see you today.  We have reviewed your prior records including labs and tests today  Test(s) ordered today. Your results will be released to [place] (or called to you) after review, usually within 72hours after test completion. If any changes need to be made, you will be notified at that same time.  Medications reviewed and updated  stop diltiazem Take losartan once every day Continue carvedilol twice daily Take baby aspirin 81 mg every day  Refill on medication(s) as discussed today.  Please schedule followup in 3 months, call sooner if problems.  HAPPY BIRTHDAY next week!

## 2013-07-08 NOTE — Assessment & Plan Note (Signed)
Trop I peak 1.53 06/2013 in setting of UTI/PN hosp Demand ischemia from acute illness Cards eval IP: rec change CCB to ARB and continue BB and ASA Echo with isch CM - expected to be transient - follow up cards a OP as planned

## 2013-07-13 ENCOUNTER — Other Ambulatory Visit: Payer: Self-pay | Admitting: *Deleted

## 2013-07-13 MED ORDER — LOSARTAN POTASSIUM 25 MG PO TABS: 25.0000 mg | ORAL_TABLET | Freq: Every day | ORAL | Status: DC

## 2013-07-13 MED ORDER — CARVEDILOL 3.125 MG PO TABS: 3.1250 mg | ORAL_TABLET | Freq: Two times a day (BID) | ORAL | Status: DC

## 2013-09-29 ENCOUNTER — Ambulatory Visit: Payer: Medicare Other | Admitting: Hematology and Oncology

## 2013-09-29 ENCOUNTER — Other Ambulatory Visit: Payer: Medicare Other

## 2013-10-14 ENCOUNTER — Encounter: Payer: Self-pay | Admitting: Internal Medicine

## 2013-10-14 ENCOUNTER — Other Ambulatory Visit (INDEPENDENT_AMBULATORY_CARE_PROVIDER_SITE_OTHER): Payer: Medicare Other

## 2013-10-14 ENCOUNTER — Ambulatory Visit (INDEPENDENT_AMBULATORY_CARE_PROVIDER_SITE_OTHER): Payer: Medicare Other | Admitting: Internal Medicine

## 2013-10-14 VITALS — BP 120/60 | HR 79 | Temp 97.6°F | Ht 69.0 in | Wt 151.4 lb

## 2013-10-14 DIAGNOSIS — E1149 Type 2 diabetes mellitus with other diabetic neurological complication: Secondary | ICD-10-CM

## 2013-10-14 DIAGNOSIS — E538 Deficiency of other specified B group vitamins: Secondary | ICD-10-CM

## 2013-10-14 DIAGNOSIS — R42 Dizziness and giddiness: Secondary | ICD-10-CM

## 2013-10-14 DIAGNOSIS — E039 Hypothyroidism, unspecified: Secondary | ICD-10-CM

## 2013-10-14 LAB — BASIC METABOLIC PANEL
BUN: 35 mg/dL — ABNORMAL HIGH (ref 6–23)
CO2: 28 meq/L (ref 19–32)
CREATININE: 1.4 mg/dL (ref 0.4–1.5)
Calcium: 9.9 mg/dL (ref 8.4–10.5)
Chloride: 106 mEq/L (ref 96–112)
GFR: 48.53 mL/min — ABNORMAL LOW (ref >60.00)
Glucose, Bld: 132 mg/dL — ABNORMAL HIGH (ref 70–99)
Potassium: 5.6 mEq/L — ABNORMAL HIGH (ref 3.5–5.1)
SODIUM: 140 meq/L (ref 135–145)

## 2013-10-14 LAB — HEMOGLOBIN A1C: HEMOGLOBIN A1C: 6.2 % (ref 4.6–6.5)

## 2013-10-14 LAB — TSH: TSH: 2.53 u[IU]/mL (ref 0.35–4.50)

## 2013-10-14 MED ORDER — CYANOCOBALAMIN 1000 MCG/ML IJ SOLN
1000.0000 ug | Freq: Once | INTRAMUSCULAR | Status: AC
  Administered 2013-10-14: 1000 ug via INTRAMUSCULAR

## 2013-10-14 MED ORDER — MECLIZINE HCL 25 MG PO TABS: 25.0000 mg | ORAL_TABLET | Freq: Three times a day (TID) | ORAL | Status: DC | PRN

## 2013-10-14 NOTE — Assessment & Plan Note (Signed)
Metformin solo tx - Not on statin  Also on ARB and ASA 81 qd reviewed a1c and lipids - check q6-109mo adjust tx as needed but will balance risk/benefit of tx with adv age Lab Results  Component Value Date   HGBA1C 6.4* 06/06/2013

## 2013-10-14 NOTE — Assessment & Plan Note (Signed)
Lab Results  Component Value Date   [id number] 02/16/2013   continue IM replacement as needed

## 2013-10-14 NOTE — Assessment & Plan Note (Signed)
Recurrent, chronic symptoms -refuses to use balance aide like walker or cane resume meclizine tid prn - previously helped for transient period

## 2013-10-14 NOTE — Patient Instructions (Signed)
It was good to see you today.  We have reviewed your prior records including labs and tests today  B12 shot done today - can repeat every 30 days as needed  Test(s) ordered today. Your results will be released to [place] (or called to you) after review, usually within 72hours after test completion. If any changes need to be made, you will be notified at that same time.  Medications reviewed and updated, no changes recommended at this time. Refill on medication(s) as discussed today.  Please schedule followup in 3-6 months, call sooner if problems.

## 2013-10-14 NOTE — Assessment & Plan Note (Signed)
Check TSH q6-11mo, adjust as needed Lab Results  Component Value Date   TSH 0.646 02/16/2013

## 2013-10-14 NOTE — Progress Notes (Signed)
Subjective:    Patient ID: [name] [name], male    DOB: 04-12-1917, 80 y.o.   MRN: [id number]  HPI  Patient here for follow up Reviewed chronic medical issues and interval medical events  Past Medical History  Diagnosis Date  . Arthritis   . Diabetes mellitus   . Heart murmur   . Hypertension   . Hypothyroid   . Prostate cancer 1998 dx  . MDS (myelodysplastic syndrome) 12/2006     hyperplastic on bone marrow bx 2008    Review of Systems  Constitutional: Positive for fatigue (but still mowing yard with push mower twice week). Negative for fever and unexpected weight change.  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  Respiratory: Negative for cough and shortness of breath.   Cardiovascular: Negative for chest pain and leg swelling.  Neurological: Positive for dizziness (intermittent, chronic). Negative for tremors, syncope, weakness and headaches.       Objective:   Physical Exam  BP 120/60  Pulse 79  Temp(Src) 97.6 F (36.4 C) (Oral)  Ht 5\' 9"  (1.753 m)  Wt 151 lb 6 oz (68.663 kg)  BMI 22.34 kg/m2  SpO2 97% Wt Readings from Last 3 Encounters:  10/14/13 151 lb 6 oz (68.663 kg)  07/08/13 153 lb 1.9 oz (69.455 kg)  06/06/13 154 lb 8.7 oz (70.1 kg)   Constitutional: he appears well-developed and well-nourished. No distress. Dtr at side  Neck: Normal range of motion. Neck supple. No JVD present. No thyromegaly present.  Cardiovascular: Normal rate, regular rhythm and normal heart sounds.  No murmur heard. No BLE edema. Pulmonary/Chest: Effort normal and breath sounds normal. No respiratory distress. he has no wheezes.  Psychiatric: he has a normal mood and affect. His behavior is normal. Judgment and thought content normal.   Lab Results  Component Value Date   WBC 5.2 07/08/2013   HGB 11.7* 07/08/2013   HCT 35.5* 07/08/2013   PLT 162.0 07/08/2013   GLUCOSE 120* 07/08/2013   CHOL 102 06/07/2013   TRIG 79 06/07/2013   HDL 45  06/07/2013   LDLDIRECT 89.1 11/17/2012   LDLCALC 41 06/07/2013   ALT 24 06/06/2013   AST 23 06/06/2013   NA 138 07/08/2013   K 4.5 07/08/2013   CL 102 07/08/2013   CREATININE 1.2 07/08/2013   BUN 26* 07/08/2013   CO2 27 07/08/2013   TSH 0.646 02/16/2013   PSA 0.17 03/31/2013   HGBA1C 6.4* 06/06/2013   MICROALBUR 29.0* 05/26/2013    Ct Head Wo Contrast  06/06/2013   CLINICAL DATA:  Weakness and fever. Fever and vomiting. Two recent falls.  EXAM: CT HEAD WITHOUT CONTRAST  TECHNIQUE: Contiguous axial images were obtained from the base of the skull through the vertex without intravenous contrast.  COMPARISON:  01/16/2012 and 09/25/2009  FINDINGS: Ventricles and cisterns are within normal. There is mild age related atrophic change which is stable. There is mild chronic ischemic microvascular disease. There is no mass, mass effect, shift of midline structures or acute hemorrhage. No evidence of acute infarction. Remaining bones and soft tissues are unremarkable.  IMPRESSION: No acute intracranial findings.  Age related atrophy and chronic ischemic microvascular disease.   Electronically Signed   By: Marin Olp M.D.   On: 06/06/2013 19:29   US Renal  06/09/2013   CLINICAL DATA:  Pyelonephritis. Urinary tract infection. Diabetes and hypertension. Renal insufficiency.  EXAM: RENAL/URINARY TRACT ULTRASOUND COMPLETE  COMPARISON:  None.  FINDINGS: Right Kidney:  Length: 10.7 cm. Echogenicity within normal limits. No mass or hydronephrosis visualized.  Left Kidney:  Length: 11.2 cm. Echogenicity within normal limits. No mass or hydronephrosis visualized.  Bladder:  Appears normal for degree of bladder distention.  IMPRESSION: Negative. No evidence of hydronephrosis, or other renal or bladder abnormality.   Electronically Signed   By: Earle Gell M.D.   On: 06/09/2013 13:28   Dg Chest Port 1 View  (if Code Sepsis Called)  06/06/2013   CLINICAL DATA:  Weakness.  Fever.  Sepsis.  EXAM: PORTABLE CHEST - 1 VIEW  COMPARISON:  DG CHEST  2 VIEW dated 02/15/2013; CT CHEST W/CM dated 10/19/2009  FINDINGS: Cardiomegaly. No evidence of failure. No airspace disease or effusion. Stable nodular density in the left mid chest represents a bone island seen on prior chest CT. Severe right glenohumeral osteoarthritis with high-riding humeral head consistent with chronic rotator cuff tear. Marland Kitchen  IMPRESSION: Cardiomegaly without failure or acute cardiopulmonary disease.   Electronically Signed   By: Dereck Ligas M.D.   On: 06/06/2013 19:10   Dg Abd Portable 1v  06/07/2013   CLINICAL DATA:  Nausea and vomiting  EXAM: PORTABLE ABDOMEN - 1 VIEW  COMPARISON:  10/18/2009  FINDINGS: Nonobstructive bowel gas pattern. Stool volume within normal limits. No evidence of urolithiasis or intra-abdominal mass. Advanced degenerative disc disease of the lumbar spine, with dextroscoliosis.  IMPRESSION: Nonobstructive bowel gas pattern.   Electronically Signed   By: Jorje Guild M.D.   On: 06/07/2013 02:04       Assessment & Plan:   Problem List Items Addressed This Visit   B12 deficiency      Lab Results  Component Value Date   [id number] 188 02/16/2013   continue IM replacement as needed    Dizziness     Recurrent, chronic symptoms -refuses to use balance aide like walker or cane resume meclizine tid prn - previously helped for transient period    Hypothyroid      Check TSH q6-31mo, adjust as needed Lab Results  Component Value Date   TSH 0.646 02/16/2013      Relevant Orders      TSH   Type II or unspecified type diabetes mellitus with neurological manifestations, not stated as uncontrolled - Primary      Metformin solo tx - Not on statin  Also on ARB and ASA 81 qd reviewed a1c and lipids - check q6-78mo adjust tx as needed but will balance risk/benefit of tx with adv age Lab Results  Component Value Date   HGBA1C 6.4* 06/06/2013      Relevant Orders      Hemoglobin A1c      Basic metabolic panel

## 2013-10-14 NOTE — Progress Notes (Signed)
Pre visit review using our clinic review tool, if applicable. No additional management support is needed unless otherwise documented below in the visit note. 

## 2013-10-15 ENCOUNTER — Ambulatory Visit: Payer: Medicare Other | Admitting: Internal Medicine

## 2013-11-23 ENCOUNTER — Ambulatory Visit: Payer: Medicare Other | Admitting: Internal Medicine

## 2013-12-20 ENCOUNTER — Other Ambulatory Visit: Payer: Self-pay | Admitting: Internal Medicine

## 2013-12-22 ENCOUNTER — Telehealth: Payer: Self-pay | Admitting: *Deleted

## 2013-12-22 MED ORDER — LOSARTAN POTASSIUM 25 MG PO TABS: 25.0000 mg | ORAL_TABLET | Freq: Every day | ORAL | Status: DC

## 2013-12-22 MED ORDER — LEVOTHYROXINE SODIUM 125 MCG PO TABS: ORAL_TABLET | ORAL | Status: DC

## 2013-12-22 MED ORDER — CARVEDILOL 3.125 MG PO TABS: 3.1250 mg | ORAL_TABLET | Freq: Two times a day (BID) | ORAL | Status: DC

## 2013-12-22 NOTE — Telephone Encounter (Signed)
Left msg on triage pt is needing 3 month supply on his medications. Called kathy back no naswer LMOM metformin has already been sent to [facility]. Will send the other two...Johny Chess

## 2013-12-28 ENCOUNTER — Encounter (INDEPENDENT_AMBULATORY_CARE_PROVIDER_SITE_OTHER): Payer: Medicare Other | Admitting: Ophthalmology

## 2013-12-28 DIAGNOSIS — E1165 Type 2 diabetes mellitus with hyperglycemia: Secondary | ICD-10-CM

## 2013-12-28 DIAGNOSIS — H43819 Vitreous degeneration, unspecified eye: Secondary | ICD-10-CM

## 2013-12-28 DIAGNOSIS — H3530 Unspecified macular degeneration: Secondary | ICD-10-CM

## 2013-12-28 DIAGNOSIS — I10 Essential (primary) hypertension: Secondary | ICD-10-CM

## 2013-12-28 DIAGNOSIS — E1139 Type 2 diabetes mellitus with other diabetic ophthalmic complication: Secondary | ICD-10-CM

## 2013-12-28 DIAGNOSIS — E11319 Type 2 diabetes mellitus with unspecified diabetic retinopathy without macular edema: Secondary | ICD-10-CM

## 2013-12-28 DIAGNOSIS — H35039 Hypertensive retinopathy, unspecified eye: Secondary | ICD-10-CM

## 2014-03-01 ENCOUNTER — Ambulatory Visit: Payer: Medicare Other | Admitting: Internal Medicine

## 2014-03-02 ENCOUNTER — Other Ambulatory Visit (INDEPENDENT_AMBULATORY_CARE_PROVIDER_SITE_OTHER): Payer: Medicare Other

## 2014-03-02 ENCOUNTER — Encounter: Payer: Self-pay | Admitting: Internal Medicine

## 2014-03-02 ENCOUNTER — Ambulatory Visit (INDEPENDENT_AMBULATORY_CARE_PROVIDER_SITE_OTHER): Payer: Medicare Other

## 2014-03-02 ENCOUNTER — Ambulatory Visit (INDEPENDENT_AMBULATORY_CARE_PROVIDER_SITE_OTHER): Payer: Medicare Other | Admitting: Internal Medicine

## 2014-03-02 VITALS — BP 122/70 | HR 82 | Temp 97.7°F | Resp 14 | Wt 154.2 lb

## 2014-03-02 DIAGNOSIS — E1149 Type 2 diabetes mellitus with other diabetic neurological complication: Secondary | ICD-10-CM

## 2014-03-02 DIAGNOSIS — K5900 Constipation, unspecified: Secondary | ICD-10-CM

## 2014-03-02 DIAGNOSIS — M6281 Muscle weakness (generalized): Secondary | ICD-10-CM

## 2014-03-02 DIAGNOSIS — E039 Hypothyroidism, unspecified: Secondary | ICD-10-CM

## 2014-03-02 DIAGNOSIS — I10 Essential (primary) hypertension: Secondary | ICD-10-CM

## 2014-03-02 DIAGNOSIS — R5382 Chronic fatigue, unspecified: Secondary | ICD-10-CM

## 2014-03-02 DIAGNOSIS — Z23 Encounter for immunization: Secondary | ICD-10-CM

## 2014-03-02 DIAGNOSIS — E538 Deficiency of other specified B group vitamins: Secondary | ICD-10-CM

## 2014-03-02 DIAGNOSIS — K5909 Other constipation: Secondary | ICD-10-CM

## 2014-03-02 LAB — BASIC METABOLIC PANEL
BUN: 21 mg/dL (ref 6–23)
CALCIUM: 9.4 mg/dL (ref 8.4–10.5)
CO2: 27 mEq/L (ref 19–32)
Chloride: 102 mEq/L (ref 96–112)
Creatinine, Ser: 1.1 mg/dL (ref 0.4–1.5)
GFR: 68.31 mL/min (ref >60.00)
GLUCOSE: 142 mg/dL — AB (ref 70–99)
POTASSIUM: 4.1 meq/L (ref 3.5–5.1)
SODIUM: 137 meq/L (ref 135–145)

## 2014-03-02 LAB — HEPATIC FUNCTION PANEL
ALT: 12 U/L (ref 0–53)
AST: 16 U/L (ref 0–37)
Albumin: 3.7 g/dL (ref 3.5–5.2)
Alkaline Phosphatase: 68 U/L (ref 39–117)
BILIRUBIN DIRECT: 0.1 mg/dL (ref 0.0–0.3)
TOTAL PROTEIN: 7.2 g/dL (ref 6.0–8.3)
Total Bilirubin: 0.5 mg/dL (ref 0.2–1.2)

## 2014-03-02 LAB — CBC WITH DIFFERENTIAL/PLATELET
BASOS PCT: 0.3 % (ref 0.0–3.0)
Basophils Absolute: 0 10*3/uL (ref 0.0–0.1)
Eosinophils Absolute: 0.1 10*3/uL (ref 0.0–0.7)
Eosinophils Relative: 1.2 % (ref 0.0–5.0)
HEMATOCRIT: 30.5 % — AB (ref 39.0–52.0)
HEMOGLOBIN: 10.4 g/dL — AB (ref 13.0–17.0)
LYMPHS ABS: 1.2 10*3/uL (ref 0.7–4.0)
Lymphocytes Relative: 24.2 % (ref 12.0–46.0)
MCHC: 34.1 g/dL (ref 30.0–36.0)
MCV: 85.7 fl (ref 78.0–100.0)
MONOS PCT: 8.5 % (ref 3.0–12.0)
Monocytes Absolute: 0.4 10*3/uL (ref 0.1–1.0)
NEUTROS ABS: 3.3 10*3/uL (ref 1.4–7.7)
Neutrophils Relative %: 65.8 % (ref 43.0–77.0)
Platelets: 265 10*3/uL (ref 150.0–400.0)
RBC: 3.56 Mil/uL — AB (ref 4.22–5.81)
RDW: 13.6 % (ref 11.5–15.5)
WBC: 5 10*3/uL (ref 4.0–10.5)

## 2014-03-02 LAB — HEMOGLOBIN A1C: Hgb A1c MFr Bld: 7 % — ABNORMAL HIGH (ref 4.6–6.5)

## 2014-03-02 LAB — VITAMIN B12: Vitamin B-12: 600 pg/mL (ref 211–911)

## 2014-03-02 LAB — TSH: TSH: 2.84 u[IU]/mL (ref 0.35–4.50)

## 2014-03-02 LAB — CK: Total CK: 50 U/L (ref 7–232)

## 2014-03-02 NOTE — Progress Notes (Signed)
Pre visit review using our clinic review tool, if applicable. No additional management support is needed unless otherwise documented below in the visit note. 

## 2014-03-02 NOTE — Assessment & Plan Note (Signed)
TSH 

## 2014-03-02 NOTE — Progress Notes (Signed)
Subjective:    Patient ID: [name] [name], male    DOB: 10/09/1917, 80 y.o.   MRN: [id number]  HPI   He has had some chronic fatigue symptoms for at least 2 years. Symptoms have been worse in the last 2 weeks  He has been compliant with his medicines without adverse effects  He does not monitor his blood pressure at home. Office visit BPs have been well controlled. He does restrict sodium.  He is on the metformin twice a day; he states that the glucose strips he has are not the correct strips for his machine and therefore we do not know what his blood sugars are doing at home.  Symptoms also include some chills; constipation; muscle weakness; some imbalance;  & some loss of appetite.  His last labs on record were 10/14/13. Potassium was minimally elevated at 5.6. He was mildly dehydrated with a BUN of 35 and creatinine of 1.4. GFR was  48.53 . At that time his A1c was prediabetic at 6.2%. TSH was therapeutic. His last blood count was in April of this year. He was mildly anemic but this had improved dramatically since March of this year. He has a history of B12 deficiency.  He is saddened by the recent loss of a relative. This was not a first-degree relative.    Review of Systems   He is not having fever, sweats, weight loss.  He has no blurred vision, double vision, vision loss  He denies extrinsic symptoms of itchy, watery eyes, sneezing. He's had no angioedema  He is not having dyspnea, cough, sputum production  There is no change in heart  rhythm.  He's had no associated rash.  There is no new change in his hair, skin, nails.  There is no reported excessive snoring or apnea.  No abnormal bruising or bleeding.     Objective:   Physical Exam  Positive or pertinent findings include: He has bilateral ptosis The complete dentures He has decreased auditory acuity He pushes up from the chair in an attempt to standing suggesting weakness in the lower extremities. He  has mixed DIP/PIP arthritic changes He is intermittently slightly tearful as he discusses the loss of distant relative.  Gen.: Healthy and well-nourished in appearance. Alert, appropriate and cooperative throughout exam. Appears younger than stated age  Head: Normocephalic without obvious abnormalities  Eyes: No corneal or conjunctival inflammation noted. Pupils equal round reactive to light and accommodation. Extraocular motion intact.  Ears: External  ear exam reveals no significant lesions or deformities. Canals clear .TMs normal.  Nose: External nasal exam reveals no deformity or inflammation. Nasal mucosa are pink and moist. No lesions or exudates noted.   Mouth: Oral mucosa and oropharynx reveal no lesions or exudates.  Neck: No deformities, masses, or tenderness noted. Range of motion decreased. Thyroid normal. Lungs: Normal respiratory effort; chest expands symmetrically. Lungs are clear to auscultation without rales, wheezes, or increased work of breathing. Heart: Normal rate and rhythm. Normal S1 and S2. No gallop, click, or rub. No murmur. Abdomen: Bowel sounds normal; abdomen soft and nontender. No masses, organomegaly or hernias noted.                              Musculoskeletal/extremities: No deformity or scoliosis noted of  the thoracic or lumbar spine.  No clubbing, cyanosis, edema, or significant extremity  deformity noted. Range of motion normal .Tone & strength normal.  Fingernail  health good. Vascular: Carotid, radial artery, dorsalis pedis and  posterior tibial pulses are full and equal. No bruits present. Neurologic: Alert and oriented . Deep tendon reflexes symmetrical and 1/2+  Gait broad & slow       Skin: Intact without suspicious lesions or rashes. Lymph: No cervical, axillary lymphadenopathy present. Psych: Mood and affect are normal. Normally interactive                                                                                        Assessment &  Plan:  See Current Assessment & Plan in Problem List under specific Diagnosis

## 2014-03-02 NOTE — Assessment & Plan Note (Signed)
CBC & dif BMET Hepatic panel TSH Sed rate

## 2014-03-02 NOTE — Assessment & Plan Note (Signed)
A1c

## 2014-03-02 NOTE — Patient Instructions (Signed)
Your next office appointment will be determined based upon review of your pending labs . Those instructions will be transmitted to you  by mail Followup as needed for your acute issue. Please report any significant change in your symptoms.   

## 2014-03-02 NOTE — Assessment & Plan Note (Signed)
Blood pressure goals reviewed. BMET 

## 2014-03-02 NOTE — Assessment & Plan Note (Signed)
B12 level 

## 2014-03-04 ENCOUNTER — Other Ambulatory Visit: Payer: Self-pay | Admitting: Internal Medicine

## 2014-03-04 DIAGNOSIS — E1149 Type 2 diabetes mellitus with other diabetic neurological complication: Secondary | ICD-10-CM

## 2014-03-04 DIAGNOSIS — D649 Anemia, unspecified: Secondary | ICD-10-CM

## 2014-04-04 ENCOUNTER — Other Ambulatory Visit: Payer: Self-pay | Admitting: Internal Medicine

## 2014-04-14 ENCOUNTER — Ambulatory Visit (INDEPENDENT_AMBULATORY_CARE_PROVIDER_SITE_OTHER): Payer: Medicare Other | Admitting: Internal Medicine

## 2014-04-14 ENCOUNTER — Encounter: Payer: Self-pay | Admitting: Internal Medicine

## 2014-04-14 ENCOUNTER — Other Ambulatory Visit (INDEPENDENT_AMBULATORY_CARE_PROVIDER_SITE_OTHER): Payer: Medicare Other

## 2014-04-14 VITALS — BP 118/70 | HR 76 | Temp 98.3°F | Ht 69.0 in | Wt 155.0 lb

## 2014-04-14 DIAGNOSIS — E039 Hypothyroidism, unspecified: Secondary | ICD-10-CM

## 2014-04-14 DIAGNOSIS — D649 Anemia, unspecified: Secondary | ICD-10-CM

## 2014-04-14 DIAGNOSIS — R270 Ataxia, unspecified: Secondary | ICD-10-CM

## 2014-04-14 DIAGNOSIS — Z23 Encounter for immunization: Secondary | ICD-10-CM

## 2014-04-14 DIAGNOSIS — E1149 Type 2 diabetes mellitus with other diabetic neurological complication: Secondary | ICD-10-CM

## 2014-04-14 LAB — IBC PANEL
IRON: 155 ug/dL (ref 42–165)
Saturation Ratios: 41.8 % (ref 20.0–50.0)
Transferrin: 264.7 mg/dL (ref 212.0–360.0)

## 2014-04-14 LAB — CBC WITH DIFFERENTIAL/PLATELET
Basophils Absolute: 0 10*3/uL (ref 0.0–0.1)
Basophils Relative: 0.2 % (ref 0.0–3.0)
Eosinophils Absolute: 0 10*3/uL (ref 0.0–0.7)
Eosinophils Relative: 1.2 % (ref 0.0–5.0)
HCT: 32.8 % — ABNORMAL LOW (ref 39.0–52.0)
HEMOGLOBIN: 10.9 g/dL — AB (ref 13.0–17.0)
Lymphocytes Relative: 46.5 % — ABNORMAL HIGH (ref 12.0–46.0)
Lymphs Abs: 1.9 10*3/uL (ref 0.7–4.0)
MCHC: 33.3 g/dL (ref 30.0–36.0)
MCV: 87.4 fl (ref 78.0–100.0)
Monocytes Absolute: 0.3 10*3/uL (ref 0.1–1.0)
Monocytes Relative: 8.4 % (ref 3.0–12.0)
NEUTROS ABS: 1.8 10*3/uL (ref 1.4–7.7)
NEUTROS PCT: 43.7 % (ref 43.0–77.0)
Platelets: 89 10*3/uL — ABNORMAL LOW (ref 150.0–400.0)
RBC: 3.76 Mil/uL — ABNORMAL LOW (ref 4.22–5.81)
RDW: 14.9 % (ref 11.5–15.5)
WBC: 4 10*3/uL (ref 4.0–10.5)

## 2014-04-14 LAB — HEMOGLOBIN A1C: Hgb A1c MFr Bld: 6.6 % — ABNORMAL HIGH (ref 4.6–6.5)

## 2014-04-14 MED ORDER — FERROUS SULFATE 325 (65 FE) MG PO TABS: 325.0000 mg | ORAL_TABLET | Freq: Every day | ORAL | Status: DC

## 2014-04-14 NOTE — Assessment & Plan Note (Signed)
Check TSH q6-67mo, adjust as needed Lab Results  Component Value Date   TSH 2.84 03/02/2014

## 2014-04-14 NOTE — Assessment & Plan Note (Signed)
Progressive drop on Hgb last labs For recheck with iron today FOB cards to take home Not on NSAIDs or ASA No GI loss on history

## 2014-04-14 NOTE — Progress Notes (Signed)
Pre visit review using our clinic review tool, if applicable. No additional management support is needed unless otherwise documented below in the visit note. 

## 2014-04-14 NOTE — Assessment & Plan Note (Signed)
Long-standing, multifactorial Reviewed falls since last visit Patient adamantly declines offer to live with daughter or other home health/private aide assistance Supportive care as able

## 2014-04-14 NOTE — Assessment & Plan Note (Signed)
Metformin solo tx - Not on statin  Also on ARB and ASA 81 qd reviewed a1c and lipids - check q6-34mo adjust tx as needed but will balance risk/benefit of tx with adv age Lab Results  Component Value Date   HGBA1C 7.0* 03/02/2014

## 2014-04-14 NOTE — Patient Instructions (Signed)
It was good to see you today.  Prevnar pneumonia immunization updated today  We have reviewed your prior records including labs and tests today  Test(s) ordered today. Your results will be released to [place] (or called to you) after review, usually within 72hours after test completion. If any changes need to be made, you will be notified at that same time.  Medications reviewed and updated, no changes recommended at this time. Refill on medication(s) as discussed today.  I want you to consider living with your daughter as I am concerned about your safety at home alone  Please schedule followup in 3-4 months, call sooner if problems.

## 2014-04-14 NOTE — Progress Notes (Signed)
Dr. Linna Darner placed future orders for Labs: CBC w/Diff, IBC Panel, A1c, and POC Hemoccult Bld cards (cards are ready for pt to take home).

## 2014-04-14 NOTE — Progress Notes (Signed)
Subjective:    Patient ID: [name] [name], male    DOB: December 19, 1917, 81 y.o.   MRN: [id number]  HPI  Patient here for follow up Reviewed chronic medical issues and interval medical events  Past Medical History  Diagnosis Date  . Arthritis   . Diabetes mellitus   . Heart murmur   . Hypertension   . Hypothyroid   . Prostate cancer 1998 dx  . MDS (myelodysplastic syndrome) 12/2006     hyperplastic on bone marrow bx 2008    Review of Systems  Constitutional: Negative for fatigue and unexpected weight change.  Respiratory: Negative for cough and shortness of breath.   Cardiovascular: Negative for chest pain and leg swelling.  Gastrointestinal: Negative for abdominal pain, blood in stool and rectal pain.       Objective:   Physical Exam  BP 118/70 mmHg  Pulse 76  Temp(Src) 98.3 F (36.8 C) (Oral)  Ht 5\' 9"  (1.753 m)  Wt 155 lb (70.308 kg)  BMI 22.88 kg/m2 Wt Readings from Last 3 Encounters:  04/14/14 155 lb (70.308 kg)  03/02/14 154 lb 4 oz (69.967 kg)  10/14/13 151 lb 6 oz (68.663 kg)   Constitutional: he appears well-developed and well-nourished. No distress. Dtr at side Neck: Normal range of motion. Neck supple. No JVD present. No thyromegaly present.  Cardiovascular: Normal rate, regular rhythm and normal heart sounds.  No murmur heard. No BLE edema. Pulmonary/Chest: Effort normal and breath sounds normal. No respiratory distress. he has no wheezes.  Psychiatric: he has a normal mood and affect. His behavior is normal. Judgment and thought content normal.   Lab Results  Component Value Date   WBC 5.0 03/02/2014   HGB 10.4* 03/02/2014   HCT 30.5* 03/02/2014   PLT 265.0 03/02/2014   GLUCOSE 142* 03/02/2014   CHOL 102 06/07/2013   TRIG 79 06/07/2013   HDL 45 06/07/2013   LDLDIRECT 89.1 11/17/2012   LDLCALC 41 06/07/2013   ALT 12 03/02/2014   AST 16 03/02/2014   NA 137 03/02/2014   K 4.1 03/02/2014   CL 102 03/02/2014   CREATININE 1.1 03/02/2014   BUN 21  03/02/2014   CO2 27 03/02/2014   TSH 2.84 03/02/2014   PSA 0.17 03/31/2013   HGBA1C 7.0* 03/02/2014   MICROALBUR 29.0* 05/26/2013    Ct Head Wo Contrast  06/06/2013   CLINICAL DATA:  Weakness and fever. Fever and vomiting. Two recent falls.  EXAM: CT HEAD WITHOUT CONTRAST  TECHNIQUE: Contiguous axial images were obtained from the base of the skull through the vertex without intravenous contrast.  COMPARISON:  01/16/2012 and 09/25/2009  FINDINGS: Ventricles and cisterns are within normal. There is mild age related atrophic change which is stable. There is mild chronic ischemic microvascular disease. There is no mass, mass effect, shift of midline structures or acute hemorrhage. No evidence of acute infarction. Remaining bones and soft tissues are unremarkable.  IMPRESSION: No acute intracranial findings.  Age related atrophy and chronic ischemic microvascular disease.   Electronically Signed   By: Marin Olp M.D.   On: 06/06/2013 19:29   US Renal  06/09/2013   CLINICAL DATA:  Pyelonephritis. Urinary tract infection. Diabetes and hypertension. Renal insufficiency.  EXAM: RENAL/URINARY TRACT ULTRASOUND COMPLETE  COMPARISON:  None.  FINDINGS: Right Kidney:  Length: 10.7 cm. Echogenicity within normal limits. No mass or hydronephrosis visualized.  Left Kidney:  Length: 11.2 cm. Echogenicity within normal limits. No mass or hydronephrosis visualized.  Bladder:  Appears  normal for degree of bladder distention.  IMPRESSION: Negative. No evidence of hydronephrosis, or other renal or bladder abnormality.   Electronically Signed   By: Earle Gell M.D.   On: 06/09/2013 13:28   Dg Chest Port 1 View  (if Code Sepsis Called)  06/06/2013   CLINICAL DATA:  Weakness.  Fever.  Sepsis.  EXAM: PORTABLE CHEST - 1 VIEW  COMPARISON:  DG CHEST 2 VIEW dated 02/15/2013; CT CHEST W/CM dated 10/19/2009  FINDINGS: Cardiomegaly. No evidence of failure. No airspace disease or effusion. Stable nodular density in the left mid chest  represents a bone island seen on prior chest CT. Severe right glenohumeral osteoarthritis with high-riding humeral head consistent with chronic rotator cuff tear. Marland Kitchen  IMPRESSION: Cardiomegaly without failure or acute cardiopulmonary disease.   Electronically Signed   By: Dereck Ligas M.D.   On: 06/06/2013 19:10   Dg Abd Portable 1v  06/07/2013   CLINICAL DATA:  Nausea and vomiting  EXAM: PORTABLE ABDOMEN - 1 VIEW  COMPARISON:  10/18/2009  FINDINGS: Nonobstructive bowel gas pattern. Stool volume within normal limits. No evidence of urolithiasis or intra-abdominal mass. Advanced degenerative disc disease of the lumbar spine, with dextroscoliosis.  IMPRESSION: Nonobstructive bowel gas pattern.   Electronically Signed   By: Jorje Guild M.D.   On: 06/07/2013 02:04       Assessment & Plan:   Problem List Items Addressed This Visit    Anemia - Primary    Progressive drop on Hgb last labs For recheck with iron today FOB cards to take home Not on NSAIDs or ASA No GI loss on history    Relevant Medications      ferrous sulfate tablet 325 mg   Ataxia    Long-standing, multifactorial Reviewed falls since last visit Patient adamantly declines offer to live with daughter or other home health/private aide assistance Supportive care as able    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    Metformin solo tx - Not on statin  Also on ARB and ASA 81 qd reviewed a1c and lipids - check q6-38mo adjust tx as needed but will balance risk/benefit of tx with adv age Lab Results  Component Value Date   HGBA1C 7.0* 03/02/2014      Hypothyroid    Check TSH q6-73mo, adjust as needed Lab Results  Component Value Date   TSH 2.84 03/02/2014

## 2014-06-03 ENCOUNTER — Ambulatory Visit (INDEPENDENT_AMBULATORY_CARE_PROVIDER_SITE_OTHER): Payer: Medicare Other | Admitting: Internal Medicine

## 2014-06-03 ENCOUNTER — Encounter: Payer: Self-pay | Admitting: Internal Medicine

## 2014-06-03 ENCOUNTER — Other Ambulatory Visit (INDEPENDENT_AMBULATORY_CARE_PROVIDER_SITE_OTHER): Payer: Medicare Other

## 2014-06-03 VITALS — BP 118/80 | HR 68 | Temp 98.2°F | Resp 18 | Ht 69.0 in | Wt 157.1 lb

## 2014-06-03 DIAGNOSIS — F419 Anxiety disorder, unspecified: Secondary | ICD-10-CM

## 2014-06-03 DIAGNOSIS — R5382 Chronic fatigue, unspecified: Secondary | ICD-10-CM

## 2014-06-03 DIAGNOSIS — E039 Hypothyroidism, unspecified: Secondary | ICD-10-CM | POA: Diagnosis not present

## 2014-06-03 DIAGNOSIS — F329 Major depressive disorder, single episode, unspecified: Secondary | ICD-10-CM

## 2014-06-03 DIAGNOSIS — E1149 Type 2 diabetes mellitus with other diabetic neurological complication: Secondary | ICD-10-CM

## 2014-06-03 DIAGNOSIS — K5909 Other constipation: Secondary | ICD-10-CM

## 2014-06-03 DIAGNOSIS — K5900 Constipation, unspecified: Secondary | ICD-10-CM

## 2014-06-03 DIAGNOSIS — F418 Other specified anxiety disorders: Secondary | ICD-10-CM | POA: Diagnosis not present

## 2014-06-03 DIAGNOSIS — C61 Malignant neoplasm of prostate: Secondary | ICD-10-CM

## 2014-06-03 LAB — BASIC METABOLIC PANEL
BUN: 20 mg/dL (ref 6–23)
CALCIUM: 9.3 mg/dL (ref 8.4–10.5)
CO2: 29 mEq/L (ref 19–32)
CREATININE: 1.03 mg/dL (ref 0.40–1.50)
Chloride: 105 mEq/L (ref 96–112)
GFR: 71.34 mL/min (ref >60.00)
Glucose, Bld: 93 mg/dL (ref 70–99)
Potassium: 4.7 mEq/L (ref 3.5–5.1)
Sodium: 139 mEq/L (ref 135–145)

## 2014-06-03 LAB — CBC WITH DIFFERENTIAL/PLATELET
Basophils Absolute: 0 10*3/uL (ref 0.0–0.1)
Basophils Relative: 0.3 % (ref 0.0–3.0)
EOS PCT: 1.2 % (ref 0.0–5.0)
Eosinophils Absolute: 0.1 10*3/uL (ref 0.0–0.7)
HEMATOCRIT: 35.6 % — AB (ref 39.0–52.0)
HEMOGLOBIN: 12.1 g/dL — AB (ref 13.0–17.0)
LYMPHS PCT: 39.2 % (ref 12.0–46.0)
Lymphs Abs: 1.8 10*3/uL (ref 0.7–4.0)
MCHC: 33.8 g/dL (ref 30.0–36.0)
MCV: 85.8 fl (ref 78.0–100.0)
MONOS PCT: 8.8 % (ref 3.0–12.0)
Monocytes Absolute: 0.4 10*3/uL (ref 0.1–1.0)
NEUTROS PCT: 50.5 % (ref 43.0–77.0)
Neutro Abs: 2.4 10*3/uL (ref 1.4–7.7)
PLATELETS: 116 10*3/uL — AB (ref 150.0–400.0)
RBC: 4.15 Mil/uL — ABNORMAL LOW (ref 4.22–5.81)
RDW: 14.1 % (ref 11.5–15.5)
WBC: 4.7 10*3/uL (ref 4.0–10.5)

## 2014-06-03 LAB — URINALYSIS, ROUTINE W REFLEX MICROSCOPIC
Bilirubin Urine: NEGATIVE
HGB URINE DIPSTICK: NEGATIVE
Ketones, ur: NEGATIVE
Leukocytes, UA: NEGATIVE
NITRITE: NEGATIVE
Specific Gravity, Urine: 1.025 (ref 1.000–1.030)
Total Protein, Urine: NEGATIVE
Urine Glucose: 100 — AB
Urobilinogen, UA: 0.2 (ref 0.0–1.0)
WBC UA: NONE SEEN (ref 0–?)
pH: 6 (ref 5.0–8.0)

## 2014-06-03 LAB — HEMOGLOBIN A1C: Hgb A1c MFr Bld: 6.5 % (ref 4.6–6.5)

## 2014-06-03 LAB — HEPATIC FUNCTION PANEL
ALBUMIN: 4.4 g/dL (ref 3.5–5.2)
ALT: 11 U/L (ref 0–53)
AST: 13 U/L (ref 0–37)
Alkaline Phosphatase: 88 U/L (ref 39–117)
BILIRUBIN TOTAL: 0.4 mg/dL (ref 0.2–1.2)
Bilirubin, Direct: 0.1 mg/dL (ref 0.0–0.3)
Total Protein: 7 g/dL (ref 6.0–8.3)

## 2014-06-03 LAB — TSH: TSH: 1.09 u[IU]/mL (ref 0.35–4.50)

## 2014-06-03 LAB — PSA: PSA: 0.11 ng/mL (ref 0.10–4.00)

## 2014-06-03 MED ORDER — CITALOPRAM HYDROBROMIDE 10 MG PO TABS
10.0000 mg | ORAL_TABLET | Freq: Every day | ORAL | Status: DC
Start: 2014-06-03 — End: 2014-10-31

## 2014-06-03 NOTE — Progress Notes (Signed)
Subjective:    Patient ID: [name] [name], male    DOB: 1917/11/17, 81 y.o.   MRN: [id number]  HPI  Here with family, with 1 wk feeling of coldess and fatigue, but o/w nonspecific it seems.  Pt denies chest pain, increased sob or doe, wheezing, orthopnea, PND, increased LE swelling, palpitations, dizziness or syncope.  Pt denies new neurological symptoms such as new headache, or facial or extremity weakness or numbness   Pt denies polydipsia, polyuria, Pt states overall good compliance with meds. Wt Readings from Last 3 Encounters:  06/03/14 157 lb 1.3 oz (71.251 kg)  04/14/14 155 lb (70.308 kg)  03/02/14 154 lb 4 oz (69.967 kg)   BP Readings from Last 3 Encounters:  06/03/14 118/80  04/14/14 118/70  03/02/14 122/70    Pt denies fever, wt loss, night sweats, loss of appetite, or other constitutional symptoms  Has had mild worsening depressive symptoms, but no suicidal ideation, or panic; has ongoing anxiety, not increased recently.  Denies hyper or hypo thyroid symptoms such as voice, skin or hair change. Past Medical History  Diagnosis Date  . Arthritis   . Diabetes mellitus   . Heart murmur   . Hypertension   . Hypothyroid   . Prostate cancer 1998 dx  . MDS (myelodysplastic syndrome) 12/2006     hyperplastic on bone marrow bx 2008   Past Surgical History  Procedure Laterality Date  . Appendectomy    . Total knee arthroplasty  2005    left    reports that he has never smoked. He has never used smokeless tobacco. He reports that he does not drink alcohol or use illicit drugs. family history includes Prostate cancer (age of onset: 44) in his father. Allergies  Allergen Reactions  . Statins     Age>90   Current Outpatient Prescriptions on File Prior to Visit  Medication Sig Dispense Refill  . bisacodyl (BISACODYL) 5 MG EC tablet Take 5 mg by mouth daily as needed for mild constipation or moderate constipation.    . carvedilol (COREG) 3.125 MG tablet Take 1 tablet (3.125 mg  total) by mouth 2 (two) times daily with a meal. 180 tablet 1  . ferrous sulfate 325 (65 FE) MG tablet Take 1 tablet (325 mg total) by mouth daily with breakfast. 30 tablet 3  . levothyroxine (SYNTHROID, LEVOTHROID) 125 MCG tablet TAKE ONE TABLET BY MOUTH ONCE DAILY 90 tablet 1  . losartan (COZAAR) 25 MG tablet Take 1 tablet (25 mg total) by mouth daily. 90 tablet 1  . metFORMIN (GLUCOPHAGE) 500 MG tablet Take 1 tablet (500 mg total) by mouth 2 (two) times daily. 180 tablet 1  . polyethylene glycol powder (GLYCOLAX/MIRALAX) powder Take 17 g by mouth 2 (two) times daily at 10 AM and 5 PM. 3350 g 1   No current facility-administered medications on file prior to visit.    Review of Systems  Constitutional: Negative for unusual diaphoresis or other sweats  HENT: Negative for ringing in ear Eyes: Negative for double vision or worsening visual disturbance.  Respiratory: Negative for choking and stridor.   Gastrointestinal: Negative for vomiting or other signifcant bowel change Genitourinary: Negative for hematuria or decreased urine volume.  Musculoskeletal: Negative for other MSK pain or swelling Skin: Negative for color change and worsening wound.  Neurological: Negative for tremors and numbness other than noted  Psychiatric/Behavioral: Negative for decreased concentration or agitation other than above       Objective:   Physical Exam  BP 118/80 mmHg  Pulse 68  Temp(Src) 98.2 F (36.8 C) (Oral)  Resp 18  Ht 5\' 9"  (1.753 m)  Wt 157 lb 1.3 oz (71.251 kg)  BMI 23.19 kg/m2  SpO2 96% VS noted,  Constitutional: Pt appears well-developed, well-nourished.  HENT: Head: NCAT.  Right Ear: External ear normal.  Left Ear: External ear normal.  Eyes: . Pupils are equal, round, and reactive to light. Conjunctivae and EOM are normal Neck: Normal range of motion. Neck supple.  Cardiovascular: Normal rate and regular rhythm.   Pulmonary/Chest: Effort normal and breath sounds without rales or  wheezing.  Abd:  Soft, NT, ND, + BS Neurological: Pt is alert. Not confused , motor grossly intact Skin: Skin is warm. No rash Psychiatric: Pt behavior is normal. No agitation. + depressed affect    Assessment & Plan:

## 2014-06-03 NOTE — Patient Instructions (Signed)
Please take all new medication as prescribed - the citalopram 10 mg per day  Please take the Miralax every day to be more regular  Please continue all other medications as before, and refills have been done if requested.  Please have the pharmacy call with any other refills you may need.  Please keep your appointments with your specialists as you may have planned  Please go to the XRAY Department in the Basement (go straight as you get off the elevator) for the x-ray testing  Please go to the LAB in the Basement (turn left off the elevator) for the tests to be done today  You will be contacted by phone if any changes need to be made immediately.  Otherwise, you will receive a letter about your results with an explanation, but please check with MyChart first.  Please remember to sign up for MyChart if you have not done so, as this will be important to you in the future with finding out test results, communicating by private email, and scheduling acute appointments online when needed.

## 2014-06-03 NOTE — Progress Notes (Signed)
Pre visit review using our clinic review tool, if applicable. No additional management support is needed unless otherwise documented below in the visit note. 

## 2014-06-05 NOTE — Assessment & Plan Note (Addendum)
Etiology unclear, Exam otherwise benign, to check labs as documented, follow with expectant management  Note:  Total time for pt hx, exam, review of record with pt in the room, determination of diagnoses and plan for further eval and tx is > 40 min, with over 50% spent in coordination and counseling of patient  

## 2014-06-05 NOTE — Assessment & Plan Note (Signed)
Also for psa,  to f/u any worsening symptoms or concerns  

## 2014-06-05 NOTE — Assessment & Plan Note (Signed)
For miralax daily,  to f/u any worsening symptoms or concerns

## 2014-06-05 NOTE — Assessment & Plan Note (Signed)
For citalopram 10 qd,  to f/u any worsening symptoms or concerns

## 2014-06-05 NOTE — Assessment & Plan Note (Signed)
stable overall by history and exam, recent data reviewed with pt, and pt to continue medical treatment as before,  to f/u any worsening symptoms or concerns Lab Results  Component Value Date   TSH 1.09 06/03/2014

## 2014-06-05 NOTE — Assessment & Plan Note (Signed)
stable overall by history and exam, recent data reviewed with pt, and pt to continue medical treatment as before,  to f/u any worsening symptoms or concerns, for a1c Lab Results  Component Value Date   HGBA1C 6.5 06/03/2014

## 2014-06-07 ENCOUNTER — Encounter: Payer: Self-pay | Admitting: Internal Medicine

## 2014-06-07 LAB — PROTEIN ELECTROPHORESIS, SERUM
ALBUMIN ELP: 59.9 % (ref 55.8–66.1)
Alpha-1-Globulin: 4.2 % (ref 2.9–4.9)
Alpha-2-Globulin: 10.6 % (ref 7.1–11.8)
Beta 2: 4.7 % (ref 3.2–6.5)
Beta Globulin: 6.1 % (ref 4.7–7.2)
Gamma Globulin: 14.5 % (ref 11.1–18.8)
TOTAL PROTEIN, SERUM ELECTROPHOR: 7.7 g/dL (ref 6.0–8.3)

## 2014-07-07 ENCOUNTER — Other Ambulatory Visit: Payer: Self-pay | Admitting: Internal Medicine

## 2014-08-17 ENCOUNTER — Ambulatory Visit: Payer: Medicare Other | Admitting: Internal Medicine

## 2014-10-31 ENCOUNTER — Other Ambulatory Visit (INDEPENDENT_AMBULATORY_CARE_PROVIDER_SITE_OTHER): Payer: Medicare Other

## 2014-10-31 ENCOUNTER — Ambulatory Visit (INDEPENDENT_AMBULATORY_CARE_PROVIDER_SITE_OTHER)
Admission: RE | Admit: 2014-10-31 | Discharge: 2014-10-31 | Disposition: A | Discharge status: Home / Self Care | Payer: Medicare Other | Source: Ambulatory Visit | Attending: Internal Medicine | Admitting: Internal Medicine

## 2014-10-31 ENCOUNTER — Ambulatory Visit (INDEPENDENT_AMBULATORY_CARE_PROVIDER_SITE_OTHER): Payer: Medicare Other | Admitting: Internal Medicine

## 2014-10-31 ENCOUNTER — Encounter: Payer: Self-pay | Admitting: Internal Medicine

## 2014-10-31 VITALS — BP 98/56 | HR 78 | Temp 97.9°F | Ht 69.0 in | Wt 151.2 lb

## 2014-10-31 DIAGNOSIS — E1149 Type 2 diabetes mellitus with other diabetic neurological complication: Secondary | ICD-10-CM

## 2014-10-31 DIAGNOSIS — K5909 Other constipation: Secondary | ICD-10-CM

## 2014-10-31 DIAGNOSIS — I10 Essential (primary) hypertension: Secondary | ICD-10-CM

## 2014-10-31 DIAGNOSIS — I214 Non-ST elevation (NSTEMI) myocardial infarction: Secondary | ICD-10-CM | POA: Diagnosis not present

## 2014-10-31 DIAGNOSIS — R059 Cough, unspecified: Secondary | ICD-10-CM

## 2014-10-31 DIAGNOSIS — Z0000 Encounter for general adult medical examination without abnormal findings: Secondary | ICD-10-CM

## 2014-10-31 DIAGNOSIS — K5900 Constipation, unspecified: Secondary | ICD-10-CM

## 2014-10-31 DIAGNOSIS — E039 Hypothyroidism, unspecified: Secondary | ICD-10-CM

## 2014-10-31 DIAGNOSIS — R05 Cough: Secondary | ICD-10-CM

## 2014-10-31 LAB — LIPID PANEL
Cholesterol: 135 mg/dL (ref 0–200)
HDL: 34.6 mg/dL — AB (ref >39.00)
LDL Cholesterol: 70 mg/dL (ref 0–99)
NonHDL: 100.4
TRIGLYCERIDES: 153 mg/dL — AB (ref 0.0–149.0)
Total CHOL/HDL Ratio: 4
VLDL: 30.6 mg/dL (ref 0.0–40.0)

## 2014-10-31 LAB — MICROALBUMIN / CREATININE URINE RATIO
Creatinine,U: 179.8 mg/dL
MICROALB/CREAT RATIO: 0.4 mg/g (ref 0.0–30.0)
Microalb, Ur: <0.7 mg/dL (ref 0.0–1.9)

## 2014-10-31 LAB — HEMOGLOBIN A1C: Hgb A1c MFr Bld: 6.2 % (ref 4.6–6.5)

## 2014-10-31 MED ORDER — ASPIRIN EC 81 MG PO TBEC: 81.0000 mg | DELAYED_RELEASE_TABLET | Freq: Every day | ORAL | Status: DC

## 2014-10-31 MED ORDER — METFORMIN HCL 500 MG PO TABS: 500.0000 mg | ORAL_TABLET | Freq: Two times a day (BID) | ORAL | Status: DC

## 2014-10-31 MED ORDER — LEVOTHYROXINE SODIUM 125 MCG PO TABS: 125.0000 ug | ORAL_TABLET | Freq: Every day | ORAL | Status: AC

## 2014-10-31 NOTE — Assessment & Plan Note (Signed)
Trop I peak 1.53 06/2013 in setting of UTI/PN hosp Demand ischemia from acute illness Cards eval IP spring 2015: recommended change CCB to ARB and continue BB and ASA Now off beta blocker and will discontinue ARB today because of symptomatic hypotension. Echo with isch CM LVEF 35% in 06/2013- expected to be transient -  Has not had any follow up with cards as OP as planned -refer for repeat echocardiogram now given symptomatic hypotension and fatigue

## 2014-10-31 NOTE — Assessment & Plan Note (Signed)
Check TSH q6-22mo, adjust as needed Lab Results  Component Value Date   TSH 1.09 06/03/2014

## 2014-10-31 NOTE — Progress Notes (Signed)
Pre visit review using our clinic review tool, if applicable. No additional management support is needed unless otherwise documented below in the visit note. 

## 2014-10-31 NOTE — Assessment & Plan Note (Signed)
BP Readings from Last 3 Encounters:  10/31/14 98/56  06/03/14 118/80  04/14/14 118/70   Carvedilol recently discontinued by skilled facility provider due to low blood pressure in the setting of fatigue Reviewed history of stress-induced non-ST elevation MI March 2015 in setting of sepsis Due to persisting fatigue, dizziness with hypoperfusion, will also discontinue low-dose ARB today

## 2014-10-31 NOTE — Assessment & Plan Note (Signed)
Metformin solo tx - Not on statin due to intolerance of same Also on ARB and ASA 81 qd -  beta-blocker stopped due to low BP/fatigue; will also HOLD ARB at this time because of same low BP/fatigue reviewed a1c and lipids - check q6-27mo adjust tx as needed but will balance risk/benefit of tx with adv age Lab Results  Component Value Date   HGBA1C 6.5 06/03/2014

## 2014-10-31 NOTE — Assessment & Plan Note (Signed)
advised over-the-counter MiraLAX twice-three times daily with Duclox as needed emphasized importance of compliance with same for this chronic concern

## 2014-10-31 NOTE — Patient Instructions (Signed)
It was good to see you today.  We have reviewed your prior records including labs and tests today  Health Maintenance reviewed - all recommended immunizations and age-appropriate screenings are up-to-date.  Test(s) ordered today - labs and chest xray. Your results will be released to [place] (or called to you) after review, usually within 72hours after test completion. If any changes need to be made, you will be notified at that same time.  we'll make referral to cardiology for follow up of echocardiogram. Our office will contact you regarding appointment(s) once made.  Medications reviewed and updated Only prescriptions to take are metformin twice daily and levothyroxine once daily Stop losartan and carvediolol No other changes recommended at this time.  Take over-the-counter MiraLAX powder 2 times daily every day for prevention and treatment of constipation. Do not stop taking even after you have a bowel movement. If you have diarrhea every day for more than 3 days in a row, please call for further change instructions  Please schedule followup in 6 months for semi-annual exam and labs, call sooner if problems.

## 2014-10-31 NOTE — Progress Notes (Signed)
Subjective:    Patient ID: [name] [name], male    DOB: 01-Sep-1917, 81 y.o.   MRN: [id number]  HPI   Here for medicare wellness  Diet: heart healthy, diabetic Physical activity: sedentary Depression/mood screen: negative - never began SSRI as advised by Dr Jenny Reichmann 05/2014 Hearing: intact to whispered voice Visual acuity: grossly normal, performs annual eye exam  ADLs: capable Fall risk: none Home safety: good Cognitive evaluation: intact to orientation, naming, recall and repetition EOL planning: adv directives, full code/ I agree  I have personally reviewed and have noted 1. The patient's medical and social history 2. Their use of alcohol, tobacco or illicit drugs 3. Their current medications and supplements 4. The patient's functional ability including ADL's, fall risks, home safety risks and hearing or visual impairment. 5. Diet and physical activities 6. Evidence for depression or mood disorders  Also reviewed chronic medical issues, interval events and current concerns  Past Medical History  Diagnosis Date  . Arthritis   . Diabetes mellitus   . Heart murmur   . Hypertension   . Hypothyroid   . Prostate cancer 1998 dx  . MDS (myelodysplastic syndrome) 12/2006     hyperplastic on bone marrow bx 2008  . NSTEMI (non-ST elevation myocardial infarction) 06/2013    demand ischemia in setting of sepsis   Family History  Problem Relation Age of Onset  . Prostate cancer Father 68   History  Substance Use Topics  . Smoking status: Never Smoker   . Smokeless tobacco: Never Used  . Alcohol Use: No    Review of Systems  Constitutional: Positive for fatigue. Negative for fever, activity change, appetite change and unexpected weight change.  Respiratory: Positive for cough ("light green" every AM for 1 month). Negative for chest tightness, shortness of breath and wheezing.   Cardiovascular: Negative for chest pain, palpitations and leg swelling.  Gastrointestinal: Positive  for constipation (chronic, no change).  Neurological: Negative for dizziness, weakness and headaches.  Psychiatric/Behavioral: Negative for dysphoric mood. The patient is not nervous/anxious.   All other systems reviewed and are negative.   Patient Care Team: Rowe Clack, MD as PCP - General (Internal Medicine) Festus Aloe, MD (Urology) Heath Lark, MD (Hematology and Oncology) Jerline Pain, MD (Cardiology)     Objective:    Physical Exam  Constitutional: He appears well-developed and well-nourished. No distress.  Cardiovascular: Normal rate, regular rhythm and normal heart sounds.   No murmur heard. Pulmonary/Chest: Effort normal and breath sounds normal. No respiratory distress.    BP 98/56 mmHg  Pulse 78  Temp(Src) 97.9 F (36.6 C) (Oral)  Ht 5\' 9"  (1.753 m)  Wt 151 lb 4 oz (68.607 kg)  BMI 22.33 kg/m2  SpO2 94% Wt Readings from Last 3 Encounters:  10/31/14 151 lb 4 oz (68.607 kg)  06/03/14 157 lb 1.3 oz (71.251 kg)  04/14/14 155 lb (70.308 kg)    Lab Results  Component Value Date   WBC 4.7 06/03/2014   HGB 12.1* 06/03/2014   HCT 35.6* 06/03/2014   PLT 116.0* 06/03/2014   GLUCOSE 93 06/03/2014   CHOL 102 06/07/2013   TRIG 79 06/07/2013   HDL 45 06/07/2013   LDLDIRECT 89.1 11/17/2012   LDLCALC 41 06/07/2013   ALT 11 06/03/2014   AST 13 06/03/2014   NA 139 06/03/2014   K 4.7 06/03/2014   CL 105 06/03/2014   CREATININE 1.03 06/03/2014   BUN 20 06/03/2014   CO2 29 06/03/2014  TSH 1.09 06/03/2014   PSA 0.11 06/03/2014   HGBA1C 6.5 06/03/2014   MICROALBUR 29.0* 05/26/2013    Ct Head Wo Contrast  06/06/2013   CLINICAL DATA:  Weakness and fever. Fever and vomiting. Two recent falls.  EXAM: CT HEAD WITHOUT CONTRAST  TECHNIQUE: Contiguous axial images were obtained from the base of the skull through the vertex without intravenous contrast.  COMPARISON:  01/16/2012 and 09/25/2009  FINDINGS: Ventricles and cisterns are within normal. There is mild age  related atrophic change which is stable. There is mild chronic ischemic microvascular disease. There is no mass, mass effect, shift of midline structures or acute hemorrhage. No evidence of acute infarction. Remaining bones and soft tissues are unremarkable.  IMPRESSION: No acute intracranial findings.  Age related atrophy and chronic ischemic microvascular disease.   Electronically Signed   By: Marin Olp M.D.   On: 06/06/2013 19:29   US Renal  06/09/2013   CLINICAL DATA:  Pyelonephritis. Urinary tract infection. Diabetes and hypertension. Renal insufficiency.  EXAM: RENAL/URINARY TRACT ULTRASOUND COMPLETE  COMPARISON:  None.  FINDINGS: Right Kidney:  Length: 10.7 cm. Echogenicity within normal limits. No mass or hydronephrosis visualized.  Left Kidney:  Length: 11.2 cm. Echogenicity within normal limits. No mass or hydronephrosis visualized.  Bladder:  Appears normal for degree of bladder distention.  IMPRESSION: Negative. No evidence of hydronephrosis, or other renal or bladder abnormality.   Electronically Signed   By: Earle Gell M.D.   On: 06/09/2013 13:28   Dg Chest Port 1 View  (if Code Sepsis Called)  06/06/2013   CLINICAL DATA:  Weakness.  Fever.  Sepsis.  EXAM: PORTABLE CHEST - 1 VIEW  COMPARISON:  DG CHEST 2 VIEW dated 02/15/2013; CT CHEST W/CM dated 10/19/2009  FINDINGS: Cardiomegaly. No evidence of failure. No airspace disease or effusion. Stable nodular density in the left mid chest represents a bone island seen on prior chest CT. Severe right glenohumeral osteoarthritis with high-riding humeral head consistent with chronic rotator cuff tear. Marland Kitchen  IMPRESSION: Cardiomegaly without failure or acute cardiopulmonary disease.   Electronically Signed   By: Dereck Ligas M.D.   On: 06/06/2013 19:10   Dg Abd Portable 1v  06/07/2013   CLINICAL DATA:  Nausea and vomiting  EXAM: PORTABLE ABDOMEN - 1 VIEW  COMPARISON:  10/18/2009  FINDINGS: Nonobstructive bowel gas pattern. Stool volume within normal  limits. No evidence of urolithiasis or intra-abdominal mass. Advanced degenerative disc disease of the lumbar spine, with dextroscoliosis.  IMPRESSION: Nonobstructive bowel gas pattern.   Electronically Signed   By: Jorje Guild M.D.   On: 06/07/2013 02:04       Assessment & Plan:   AWV/z00.00 - Today patient counseled on age appropriate routine health concerns for screening and prevention, each reviewed and up to date or declined. Immunizations reviewed and up to date or declined. Labs ordered and reviewed. Risk factors for depression reviewed and negative. Hearing function and visual acuity are intact. ADLs screened and addressed as needed. Functional ability and level of safety reviewed and appropriate. Education, counseling and referrals performed based on assessed risks today. Patient provided with a copy of personalized plan for preventive services.  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    Metformin solo tx - Not on statin  due to intolerance of same Also on ARB and ASA 81 qd -  beta-blocker stopped due to low BP/fatigue; will also HOLD ARB at this time because of same low BP/fatigue reviewed a1c and lipids - check q6-75mo adjust tx as needed but will balance risk/benefit of tx with adv age Lab Results  Component Value Date   HGBA1C 6.5 06/03/2014        Relevant Medications   metFORMIN (GLUCOPHAGE) 500 MG tablet   aspirin EC 81 MG tablet   Other Relevant Orders   Hemoglobin A1c   Microalbumin / creatinine urine ratio   Lipid panel   Hypertension    BP Readings from Last 3 Encounters:  10/31/14 98/56  06/03/14 118/80  04/14/14 118/70   Carvedilol recently discontinued by skilled facility provider due to low blood pressure in  the setting of fatigue Reviewed history of stress-induced non-ST elevation MI March 2015 in setting of sepsis Due to persisting fatigue, dizziness with hypoperfusion, will also discontinue low-dose ARB today      Relevant Medications   aspirin EC 81 MG tablet   Hypothyroid    Check TSH q6-43mo, adjust as needed Lab Results  Component Value Date   TSH 1.09 06/03/2014         Relevant Medications   levothyroxine (SYNTHROID, LEVOTHROID) 125 MCG tablet   NSTEMI (non-ST elevated myocardial infarction)    Trop I peak 1.53 06/2013 in setting of UTI/PN hosp Demand ischemia from acute illness Cards eval IP spring 2015: recommended change CCB to ARB and continue BB and ASA Now off beta blocker and will discontinue ARB today because of symptomatic hypotension. Echo with isch CM LVEF 35% in 06/2013- expected to be transient -  Has not had any follow up with cards as OP as planned -refer for repeat echocardiogram now given symptomatic hypotension and fatigue      Relevant Medications   aspirin EC 81 MG tablet   Other Relevant Orders   ECHOCARDIOGRAM COMPLETE    Other Visit Diagnoses    Routine general medical examination at a health care facility    -  Primary    Cough        Relevant Orders    DG Chest 2 View        Gwendolyn Grant, MD

## 2014-11-07 ENCOUNTER — Ambulatory Visit (HOSPITAL_COMMUNITY): Payer: Medicare Other

## 2014-11-07 ENCOUNTER — Telehealth (HOSPITAL_COMMUNITY): Payer: Self-pay | Admitting: *Deleted

## 2014-11-15 ENCOUNTER — Ambulatory Visit (HOSPITAL_COMMUNITY): Payer: Medicare Other | Attending: Cardiovascular Disease

## 2014-11-15 ENCOUNTER — Other Ambulatory Visit: Payer: Self-pay

## 2014-11-15 DIAGNOSIS — I214 Non-ST elevation (NSTEMI) myocardial infarction: Secondary | ICD-10-CM | POA: Diagnosis not present

## 2014-11-15 DIAGNOSIS — I10 Essential (primary) hypertension: Secondary | ICD-10-CM | POA: Diagnosis not present

## 2014-11-15 DIAGNOSIS — E119 Type 2 diabetes mellitus without complications: Secondary | ICD-10-CM | POA: Insufficient documentation

## 2014-11-15 DIAGNOSIS — I340 Nonrheumatic mitral (valve) insufficiency: Secondary | ICD-10-CM | POA: Diagnosis not present

## 2014-11-15 DIAGNOSIS — I429 Cardiomyopathy, unspecified: Secondary | ICD-10-CM | POA: Diagnosis present

## 2014-11-23 ENCOUNTER — Telehealth: Payer: Self-pay | Admitting: Internal Medicine

## 2014-11-23 NOTE — Telephone Encounter (Signed)
[name] [name] returned your call. You may leave a detailed msg on her vm

## 2014-11-24 NOTE — Telephone Encounter (Signed)
Left message with dtr: detail about ECHO results.

## 2014-12-19 ENCOUNTER — Telehealth: Payer: Self-pay | Admitting: Internal Medicine

## 2014-12-19 NOTE — Telephone Encounter (Signed)
Patient's daughter called stating they need rx for diabetic supplies. We should have received a fax from [employer].

## 2014-12-20 NOTE — Telephone Encounter (Signed)
I have this form. Will give to PCP to sign and fax back.

## 2014-12-29 ENCOUNTER — Ambulatory Visit (INDEPENDENT_AMBULATORY_CARE_PROVIDER_SITE_OTHER): Payer: Medicare Other | Admitting: Ophthalmology

## 2015-04-10 ENCOUNTER — Emergency Department (HOSPITAL_COMMUNITY): Payer: Medicare Other

## 2015-04-10 ENCOUNTER — Encounter (HOSPITAL_COMMUNITY): Payer: Self-pay | Admitting: Radiology

## 2015-04-10 ENCOUNTER — Inpatient Hospital Stay (HOSPITAL_COMMUNITY): Payer: Medicare Other

## 2015-04-10 ENCOUNTER — Inpatient Hospital Stay (HOSPITAL_COMMUNITY)
Admission: EM | Admit: 2015-04-10 | Discharge: 2015-04-12 | DRG: 195 | Disposition: A | Discharge status: Home Health | Payer: Medicare Other | Attending: Internal Medicine | Admitting: Internal Medicine

## 2015-04-10 DIAGNOSIS — Z8042 Family history of malignant neoplasm of prostate: Secondary | ICD-10-CM

## 2015-04-10 DIAGNOSIS — I10 Essential (primary) hypertension: Secondary | ICD-10-CM | POA: Diagnosis present

## 2015-04-10 DIAGNOSIS — Z79899 Other long term (current) drug therapy: Secondary | ICD-10-CM | POA: Diagnosis not present

## 2015-04-10 DIAGNOSIS — D469 Myelodysplastic syndrome, unspecified: Secondary | ICD-10-CM | POA: Diagnosis present

## 2015-04-10 DIAGNOSIS — D649 Anemia, unspecified: Secondary | ICD-10-CM | POA: Diagnosis present

## 2015-04-10 DIAGNOSIS — E038 Other specified hypothyroidism: Secondary | ICD-10-CM

## 2015-04-10 DIAGNOSIS — I252 Old myocardial infarction: Secondary | ICD-10-CM

## 2015-04-10 DIAGNOSIS — Z66 Do not resuscitate: Secondary | ICD-10-CM | POA: Diagnosis present

## 2015-04-10 DIAGNOSIS — J189 Pneumonia, unspecified organism: Principal | ICD-10-CM | POA: Diagnosis present

## 2015-04-10 DIAGNOSIS — W1839XA Other fall on same level, initial encounter: Secondary | ICD-10-CM | POA: Diagnosis present

## 2015-04-10 DIAGNOSIS — M1990 Unspecified osteoarthritis, unspecified site: Secondary | ICD-10-CM | POA: Diagnosis present

## 2015-04-10 DIAGNOSIS — D509 Iron deficiency anemia, unspecified: Secondary | ICD-10-CM | POA: Diagnosis present

## 2015-04-10 DIAGNOSIS — R42 Dizziness and giddiness: Secondary | ICD-10-CM | POA: Diagnosis not present

## 2015-04-10 DIAGNOSIS — E039 Hypothyroidism, unspecified: Secondary | ICD-10-CM | POA: Diagnosis present

## 2015-04-10 DIAGNOSIS — E860 Dehydration: Secondary | ICD-10-CM | POA: Diagnosis present

## 2015-04-10 DIAGNOSIS — R011 Cardiac murmur, unspecified: Secondary | ICD-10-CM | POA: Diagnosis present

## 2015-04-10 DIAGNOSIS — Z96652 Presence of left artificial knee joint: Secondary | ICD-10-CM | POA: Diagnosis present

## 2015-04-10 DIAGNOSIS — Z8546 Personal history of malignant neoplasm of prostate: Secondary | ICD-10-CM | POA: Diagnosis not present

## 2015-04-10 DIAGNOSIS — Z9181 History of falling: Secondary | ICD-10-CM | POA: Diagnosis not present

## 2015-04-10 DIAGNOSIS — E119 Type 2 diabetes mellitus without complications: Secondary | ICD-10-CM | POA: Diagnosis present

## 2015-04-10 DIAGNOSIS — Z7984 Long term (current) use of oral hypoglycemic drugs: Secondary | ICD-10-CM | POA: Diagnosis not present

## 2015-04-10 DIAGNOSIS — Z888 Allergy status to other drugs, medicaments and biological substances status: Secondary | ICD-10-CM | POA: Diagnosis not present

## 2015-04-10 DIAGNOSIS — W19XXXA Unspecified fall, initial encounter: Secondary | ICD-10-CM | POA: Diagnosis not present

## 2015-04-10 LAB — URINALYSIS, ROUTINE W REFLEX MICROSCOPIC
BILIRUBIN URINE: NEGATIVE
Glucose, UA: NEGATIVE mg/dL
Hgb urine dipstick: NEGATIVE
KETONES UR: NEGATIVE mg/dL
Leukocytes, UA: NEGATIVE
NITRITE: NEGATIVE
Protein, ur: NEGATIVE mg/dL
Specific Gravity, Urine: 1.022 (ref 1.005–1.030)
pH: 5.5 (ref 5.0–8.0)

## 2015-04-10 LAB — CBC WITH DIFFERENTIAL/PLATELET
BASOS ABS: 0 10*3/uL (ref 0.0–0.1)
Basophils Relative: 1 %
EOS ABS: 0.1 10*3/uL (ref 0.0–0.7)
EOS PCT: 2 %
HCT: 33.6 % — ABNORMAL LOW (ref 39.0–52.0)
HEMOGLOBIN: 11.3 g/dL — AB (ref 13.0–17.0)
LYMPHS ABS: 1.1 10*3/uL (ref 0.7–4.0)
LYMPHS PCT: 32 %
MCH: 29.9 pg (ref 26.0–34.0)
MCHC: 33.6 g/dL (ref 30.0–36.0)
MCV: 88.9 fL (ref 78.0–100.0)
Monocytes Absolute: 0.3 10*3/uL (ref 0.1–1.0)
Monocytes Relative: 9 %
NEUTROS PCT: 56 %
Neutro Abs: 1.9 10*3/uL (ref 1.7–7.7)
Platelets: 121 10*3/uL — ABNORMAL LOW (ref 150–400)
RBC: 3.78 MIL/uL — AB (ref 4.22–5.81)
RDW: 13.6 % (ref 11.5–15.5)
WBC: 3.4 10*3/uL — AB (ref 4.0–10.5)

## 2015-04-10 LAB — COMPREHENSIVE METABOLIC PANEL
ALK PHOS: 78 U/L (ref 38–126)
ALT: 14 U/L — AB (ref 17–63)
AST: 17 U/L (ref 15–41)
Albumin: 3.9 g/dL (ref 3.5–5.0)
Anion gap: 9 (ref 5–15)
BUN: 20 mg/dL (ref 6–20)
CALCIUM: 8.6 mg/dL — AB (ref 8.9–10.3)
CO2: 27 mmol/L (ref 22–32)
CREATININE: 0.95 mg/dL (ref 0.61–1.24)
Chloride: 106 mmol/L (ref 101–111)
Glucose, Bld: 159 mg/dL — ABNORMAL HIGH (ref 65–99)
Potassium: 4.1 mmol/L (ref 3.5–5.1)
Sodium: 142 mmol/L (ref 135–145)
Total Bilirubin: 0.6 mg/dL (ref 0.3–1.2)
Total Protein: 6.6 g/dL (ref 6.5–8.1)

## 2015-04-10 LAB — TROPONIN I

## 2015-04-10 MED ORDER — GUAIFENESIN ER 600 MG PO TB12
600.0000 mg | ORAL_TABLET | Freq: Two times a day (BID) | ORAL | Status: DC
  Administered 2015-04-10 – 2015-04-12 (×4): 600 mg via ORAL
  Filled 2015-04-10 (×5): qty 1

## 2015-04-10 MED ORDER — DEXTROSE 5 % IV SOLN
500.0000 mg | INTRAVENOUS | Status: DC
  Administered 2015-04-10 – 2015-04-11 (×2): 500 mg via INTRAVENOUS
  Filled 2015-04-10 (×3): qty 500

## 2015-04-10 MED ORDER — SODIUM CHLORIDE 0.9 % IV BOLUS (SEPSIS)
500.0000 mL | Freq: Once | INTRAVENOUS | Status: AC
  Administered 2015-04-10: 500 mL via INTRAVENOUS

## 2015-04-10 MED ORDER — LEVOTHYROXINE SODIUM 125 MCG PO TABS
125.0000 ug | ORAL_TABLET | Freq: Every day | ORAL | Status: DC
  Administered 2015-04-11 – 2015-04-12 (×2): 125 ug via ORAL
  Filled 2015-04-10 (×2): qty 1

## 2015-04-10 MED ORDER — ENOXAPARIN SODIUM 40 MG/0.4ML ~~LOC~~ SOLN
40.0000 mg | SUBCUTANEOUS | Status: DC
  Administered 2015-04-10 – 2015-04-11 (×2): 40 mg via SUBCUTANEOUS
  Filled 2015-04-10 (×3): qty 0.4

## 2015-04-10 MED ORDER — FERROUS SULFATE 325 (65 FE) MG PO TABS
325.0000 mg | ORAL_TABLET | Freq: Every day | ORAL | Status: DC
  Administered 2015-04-11 – 2015-04-12 (×2): 325 mg via ORAL
  Filled 2015-04-10 (×2): qty 1

## 2015-04-10 MED ORDER — SODIUM CHLORIDE 0.9 % IV SOLN
INTRAVENOUS | Status: DC
  Administered 2015-04-10 – 2015-04-11 (×3): via INTRAVENOUS

## 2015-04-10 MED ORDER — BISACODYL 5 MG PO TBEC: 5.0000 mg | DELAYED_RELEASE_TABLET | Freq: Every day | ORAL | Status: DC | PRN

## 2015-04-10 MED ORDER — SODIUM CHLORIDE 0.9 % IJ SOLN
3.0000 mL | Freq: Two times a day (BID) | INTRAMUSCULAR | Status: DC
  Administered 2015-04-11: 3 mL via INTRAVENOUS

## 2015-04-10 MED ORDER — DEXTROSE 5 % IV SOLN
1.0000 g | Freq: Once | INTRAVENOUS | Status: AC
  Administered 2015-04-10: 1 g via INTRAVENOUS
  Filled 2015-04-10: qty 10

## 2015-04-10 MED ORDER — DEXTROSE 5 % IV SOLN
1.0000 g | INTRAVENOUS | Status: DC
  Administered 2015-04-11 – 2015-04-12 (×2): 1 g via INTRAVENOUS
  Filled 2015-04-10 (×2): qty 10

## 2015-04-10 MED ORDER — MECLIZINE HCL 25 MG PO TABS
25.0000 mg | ORAL_TABLET | Freq: Once | ORAL | Status: AC
  Administered 2015-04-10: 25 mg via ORAL
  Filled 2015-04-10: qty 1

## 2015-04-10 NOTE — ED Notes (Signed)
Patient denies pain but is reporting lack of energy for 6 months.  Patient reports he has had a productive cough for an unknown time.  Patient also says he feels cold all the time.

## 2015-04-10 NOTE — ED Notes (Signed)
Pt transported to MRI 

## 2015-04-10 NOTE — ED Notes (Addendum)
Pt.is unable to use the restroom at this time, but is aware that we need a urine specimen. Urinal at bedside.

## 2015-04-10 NOTE — ED Notes (Signed)
Bed: [id number] Expected date:  Expected time:  Means of arrival:  Comments: [facility]

## 2015-04-10 NOTE — H&P (Signed)
History and Physical  [name] [id number] DOB: Sep 02, 1917 DOA: 04/10/2015  Referring physician: EDP PCP: Hoyt Koch, MD   Chief Complaint: feeling dizzy, cough  HPI: [name] [name] is a 82 y.o. male  Who at baseline very function, lives by himself, brought to the ED due to above complaints. He denies fever, no sick contact, denies chest pain, no running nose, no sore throat, he reported productive cough for the last two weeks. This morning he felt dizzy, he called his daughter who called the EMS. In the ED , CT head negative, cxr with possible pna, he was given rocephin and zithromax, hospitalist called to admit the patient.   Review of Systems:  Detail per HPI, Review of systems are otherwise negative  Past Medical History  Diagnosis Date  . Arthritis   . Diabetes mellitus   . Heart murmur   . Hypertension   . Hypothyroid   . Prostate cancer ([place]) 1998 dx  . MDS (myelodysplastic syndrome) ([place]) 12/2006     hyperplastic on bone marrow bx 2008  . NSTEMI (non-ST elevation myocardial infarction) ([place]) 06/2013    demand ischemia in setting of sepsis   Past Surgical History  Procedure Laterality Date  . Appendectomy    . Total knee arthroplasty  2005    left   Social History:  reports that he has never smoked. He has never used smokeless tobacco. He reports that he does not drink alcohol or use illicit drugs. Patient lives at home& is able to participate in activities of daily living independently , he does not drive, but still maintain his lawn and cooks.  Allergies  Allergen Reactions  . Statins     Age>90    Family History  Problem Relation Age of Onset  . Prostate cancer Father 60      Prior to Admission medications   Medication Sig Start Date End Date Taking? Authorizing Provider  bisacodyl (BISACODYL) 5 MG EC tablet Take 5 mg by mouth daily as needed for mild constipation or moderate constipation. Reported on 04/10/2015   Yes Historical Provider,  MD  docusate sodium (COLACE) 100 MG capsule Take 100 mg by mouth daily.   Yes Historical Provider, MD  ferrous sulfate 325 (65 FE) MG tablet Take 1 tablet (325 mg total) by mouth daily with breakfast. 04/14/14  Yes Rowe Clack, MD  levothyroxine (SYNTHROID, LEVOTHROID) 125 MCG tablet Take 1 tablet (125 mcg total) by mouth daily. 10/31/14  Yes Rowe Clack, MD  metFORMIN (GLUCOPHAGE) 500 MG tablet Take 1 tablet (500 mg total) by mouth 2 (two) times daily. 10/31/14  Yes Rowe Clack, MD  Multiple Vitamins-Minerals (MENS ONE DAILY PO) Take 1 tablet by mouth daily.   Yes Historical Provider, MD  aspirin EC 81 MG tablet Take 1 tablet (81 mg total) by mouth daily. Patient not taking: Reported on 04/10/2015 10/31/14   Rowe Clack, MD  polyethylene glycol powder (GLYCOLAX/MIRALAX) powder Take 17 g by mouth 2 (two) times daily at 10 AM and 5 PM. Patient not taking: Reported on 04/10/2015 05/26/13   Rowe Clack, MD    Physical Exam: BP 144/90 mmHg  Pulse 79  Temp(Src) 97.7 F (36.5 C) (Oral)  Resp 18  SpO2 100%  General:  NAD, appear younger than stated age Eyes: PERRL ENT: unremarkable Neck: supple, no JVD Cardiovascular: RRR Respiratory: CTABL Abdomen: soft/ND/ND, positive bowel sounds Skin: no rash Musculoskeletal:  No edema Psychiatric: calm/cooperative Neurologic: no focal findings  Labs on Admission:  Basic Metabolic Panel:  Recent Labs Lab 04/10/15 0800  NA 142  K 4.1  CL 106  CO2 27  GLUCOSE 159*  BUN 20  CREATININE 0.95  CALCIUM 8.6*   Liver Function Tests:  Recent Labs Lab 04/10/15 0800  AST 17  ALT 14*  ALKPHOS 78  BILITOT 0.6  PROT 6.6  ALBUMIN 3.9   No results for input(s): LIPASE, AMYLASE in the last 168 hours. No results for input(s): AMMONIA in the last 168 hours. CBC:  Recent Labs Lab 04/10/15 0800  WBC 3.4*  NEUTROABS 1.9  HGB 11.3*  HCT 33.6*  MCV 88.9  PLT 121*   Cardiac Enzymes:  Recent Labs Lab  04/10/15 0800  TROPONINI <0.03    BNP (last 3 results) No results for input(s): BNP in the last 8760 hours.  ProBNP (last 3 results) No results for input(s): PROBNP in the last 8760 hours.  CBG: No results for input(s): GLUCAP in the last 168 hours.  Radiological Exams on Admission: Dg Chest 2 View  04/10/2015  CLINICAL DATA:  82 year old male with cough and recent fall. EXAM: CHEST  2 VIEW COMPARISON:  10/31/2014 and prior chest radiographs dating back to 2011. FINDINGS: Mild cardiomegaly noted. Patchy opacity overlying the lateral right mid lung is identified. There is no evidence of pleural effusion, pulmonary edema, pneumothorax or acute bony abnormality. IMPRESSION: Patchy right mid lung opacity which may represent pneumonia. Radiographic follow-up to resolution is recommended. Electronically Signed   By: Margarette Canada M.D.   On: 04/10/2015 09:07   Ct Head Wo Contrast  04/10/2015  CLINICAL DATA:  Unsteady gait and leg weakness. The patient fell 2 days ago. EXAM: CT HEAD WITHOUT CONTRAST TECHNIQUE: Contiguous axial images were obtained from the base of the skull through the vertex without intravenous contrast. COMPARISON:  06/06/2013 FINDINGS: No mass lesion. No midline shift. No acute hemorrhage or hematoma. No extra-axial fluid collections. No evidence of acute infarction. There is diffuse moderate cerebral cortical and cerebellar atrophy with secondary ventricular dilatation. Osseous structures are normal. IMPRESSION: Stable atrophy.  Otherwise, normal exam. Electronically Signed   By: Lorriane Shire M.D.   On: 04/10/2015 08:57    EKG: Independently reviewed. Sinus rhythm, Chronic right BBB  Assessment/Plan Present on Admission:  **None**  Dizziness:  severe , not able to ambulate, ct head no acute findings, mri brain negative, echocardiog in 11/2016 unremarkable. ekg no acute findings, bed rest for now, PT when feeling better, likely tomorrow. Will check orthostatic vital signs,  clinically dehydration, will start gentle hydration.  CAP: continue rocephin and zithro which are started in the ED.  H/o falls: PT, likely will need home health.  H/o hypothyroidism: continue synthroid, check tsh  noninsulin dependent dm2, metformin held in the hospital, check a1c, carb modified diet.  H/o iron deficiency: continue iron supplement.  H/o MDS: counts stable. DVT prophylaxis: lovenox  Consultants:   Code Status: DNR  Family Communication:  Patient   Disposition Plan: admit to med tele  Time spent: 80mins  Channing Savich MD, PhD [facility] Pager [phone] If 7PM-7AM, please contact night-coverage at [email or web address], password [facility]

## 2015-04-10 NOTE — ED Notes (Signed)
Per EMS, states he has been worked up for syncope 3 years ago-fall on Saturday after waking up in am-was not worked up-did not loose consciousness but thinks he might have bumped his head

## 2015-04-10 NOTE — ED Provider Notes (Signed)
CSN: [id number]     Arrival date & time 04/10/15  0654 History   First MD Initiated Contact with Patient 04/10/15 [phone]     Chief Complaint  Patient presents with  . Near Syncope     (Consider location/radiation/quality/duration/timing/severity/associated sxs/prior Treatment) HPI Comments: Feel dizziness started this AM [place] a efw nights ago, Friday or Saturday, got up 2 AM and fell into wall, thinks hit head No LOC      Patient is a 82 y.o. male presenting with near-syncope and dizziness.  Near Syncope This is a new problem. Pertinent negatives include no chest pain, no abdominal pain, no headaches and no shortness of breath.  Dizziness Quality:  Head spinning and imbalance Severity:  Severe Onset quality:  Gradual Duration: woke up this AM with symptoms however had episode 2 nights ago. Timing:  Intermittent Progression:  Waxing and waning Chronicity:  New Context: head movement and standing up   Context: not with loss of consciousness   Relieved by:  Nothing Worsened by:  Nothing Ineffective treatments:  None tried Associated symptoms: nausea and tinnitus (occasional, not current)   Associated symptoms: no chest pain, no diarrhea, no headaches, no palpitations, no shortness of breath, no syncope, no vision changes, no vomiting and no weakness   Risk factors: no hx of stroke, no hx of vertigo and no new medications       Past Medical History  Diagnosis Date  . Arthritis   . Diabetes mellitus   . Heart murmur   . Hypertension   . Hypothyroid   . Prostate cancer ([place]) 1998 dx  . MDS (myelodysplastic syndrome) ([place]) 12/2006     hyperplastic on bone marrow bx 2008  . NSTEMI (non-ST elevation myocardial infarction) ([place]) 06/2013    demand ischemia in setting of sepsis   Past Surgical History  Procedure Laterality Date  . Appendectomy    . Total knee arthroplasty  2005    left   Family History  Problem Relation Age of Onset  . Prostate cancer Father 61   Social  History  Substance Use Topics  . Smoking status: Never Smoker   . Smokeless tobacco: Never Used  . Alcohol Use: No    Review of Systems  Constitutional: Negative for fever.  HENT: Positive for tinnitus (occasional, not current). Negative for sore throat.   Eyes: Negative for visual disturbance.  Respiratory: Negative for shortness of breath.   Cardiovascular: Positive for near-syncope. Negative for chest pain, palpitations and syncope.  Gastrointestinal: Positive for nausea. Negative for vomiting, abdominal pain and diarrhea.  Genitourinary: Negative for difficulty urinating.  Musculoskeletal: Negative for back pain and neck stiffness.  Skin: Negative for rash.  Neurological: Positive for dizziness. Negative for syncope, weakness and headaches.      Allergies  Statins  Home Medications   Prior to Admission medications   Medication Sig Start Date End Date Taking? Authorizing Provider  bisacodyl (BISACODYL) 5 MG EC tablet Take 5 mg by mouth daily as needed for mild constipation or moderate constipation. Reported on 04/10/2015   Yes Historical Provider, MD  docusate sodium (COLACE) 100 MG capsule Take 100 mg by mouth daily.   Yes Historical Provider, MD  ferrous sulfate 325 (65 FE) MG tablet Take 1 tablet (325 mg total) by mouth daily with breakfast. 04/14/14  Yes Rowe Clack, MD  levothyroxine (SYNTHROID, LEVOTHROID) 125 MCG tablet Take 1 tablet (125 mcg total) by mouth daily. 10/31/14  Yes Rowe Clack, MD  metFORMIN (GLUCOPHAGE) 500  MG tablet Take 1 tablet (500 mg total) by mouth 2 (two) times daily. 10/31/14  Yes Rowe Clack, MD  Multiple Vitamins-Minerals (MENS ONE DAILY PO) Take 1 tablet by mouth daily.   Yes Historical Provider, MD  aspirin EC 81 MG tablet Take 1 tablet (81 mg total) by mouth daily. Patient not taking: Reported on 04/10/2015 10/31/14   Rowe Clack, MD  polyethylene glycol powder (GLYCOLAX/MIRALAX) powder Take 17 g by mouth 2 (two) times  daily at 10 AM and 5 PM. Patient not taking: Reported on 04/10/2015 05/26/13   Rowe Clack, MD   BP 153/85 mmHg  Pulse 101  Temp(Src) 98 F (36.7 C) (Oral)  Resp 20  Ht 5\' 9"  (1.753 m)  Wt 151 lb (68.493 kg)  BMI 22.29 kg/m2  SpO2 100% Physical Exam  Constitutional: He is oriented to person, place, and time. He appears well-developed and well-nourished. No distress.  HENT:  Head: Normocephalic and atraumatic.  Mouth/Throat: Oropharynx is clear and moist. No oropharyngeal exudate.  Eyes: Conjunctivae and EOM are normal. Pupils are equal, round, and reactive to light.  Left pupillary abnornmality  Neck: Normal range of motion.  Cardiovascular: Normal rate, regular rhythm, normal heart sounds and intact distal pulses.  Exam reveals no gallop and no friction rub.   No murmur heard. Pulmonary/Chest: Effort normal and breath sounds normal. No respiratory distress. He has no wheezes. He has no rales.  Abdominal: Soft. He exhibits no distension. There is no tenderness. There is no guarding.  Musculoskeletal: He exhibits no edema.  Neurological: He is alert and oriented to person, place, and time. He has normal strength. No cranial nerve deficit or sensory deficit. Gait (unable to ambulate given dizziness, imbalance, unsteady with standing) abnormal. Coordination normal. GCS eye subscore is 4. GCS verbal subscore is 5. GCS motor subscore is 6.  Skin: Skin is warm and dry. He is not diaphoretic.  Nursing note and vitals reviewed.   ED Course  Procedures (including critical care time) Labs Review Labs Reviewed  CBC WITH DIFFERENTIAL/PLATELET - Abnormal; Notable for the following:    WBC 3.4 (*)    RBC 3.78 (*)    Hemoglobin 11.3 (*)    HCT 33.6 (*)    Platelets 121 (*)    All other components within normal limits  COMPREHENSIVE METABOLIC PANEL - Abnormal; Notable for the following:    Glucose, Bld 159 (*)    Calcium 8.6 (*)    ALT 14 (*)    All other components within normal  limits  URINALYSIS, ROUTINE W REFLEX MICROSCOPIC (NOT AT [facility]) - Abnormal; Notable for the following:    APPearance CLOUDY (*)    All other components within normal limits  CULTURE, BLOOD (ROUTINE X 2)  CULTURE, BLOOD (ROUTINE X 2)  TROPONIN I  CBC  COMPREHENSIVE METABOLIC PANEL  HEMOGLOBIN A1C  TSH  MAGNESIUM    Imaging Review Dg Chest 2 View  04/10/2015  CLINICAL DATA:  82 year old male with cough and recent fall. EXAM: CHEST  2 VIEW COMPARISON:  10/31/2014 and prior chest radiographs dating back to 2011. FINDINGS: Mild cardiomegaly noted. Patchy opacity overlying the lateral right mid lung is identified. There is no evidence of pleural effusion, pulmonary edema, pneumothorax or acute bony abnormality. IMPRESSION: Patchy right mid lung opacity which may represent pneumonia. Radiographic follow-up to resolution is recommended. Electronically Signed   By: Margarette Canada M.D.   On: 04/10/2015 09:07   Ct Head Wo Contrast  04/10/2015  CLINICAL DATA:  Unsteady gait and leg weakness. The patient fell 2 days ago. EXAM: CT HEAD WITHOUT CONTRAST TECHNIQUE: Contiguous axial images were obtained from the base of the skull through the vertex without intravenous contrast. COMPARISON:  06/06/2013 FINDINGS: No mass lesion. No midline shift. No acute hemorrhage or hematoma. No extra-axial fluid collections. No evidence of acute infarction. There is diffuse moderate cerebral cortical and cerebellar atrophy with secondary ventricular dilatation. Osseous structures are normal. IMPRESSION: Stable atrophy.  Otherwise, normal exam. Electronically Signed   By: Lorriane Shire M.D.   On: 04/10/2015 08:57   Mr Brain Wo Contrast  04/10/2015  CLINICAL DATA:  82 year old hypertensive diabetic male with fall 2 days ago after waking up. Did not lose consciousness. May have bump head. Prior episode of syncope 3 years ago. Prostate cancer 1984. Subsequent encounter. EXAM: MRI HEAD WITHOUT CONTRAST TECHNIQUE: Multiplanar, multiecho  pulse sequences of the brain and surrounding structures were obtained without intravenous contrast. COMPARISON:  04/10/2015 CT.  No comparison MR. FINDINGS: No acute infarct. No intracranial hemorrhage. Global atrophy without hydrocephalus. No intracranial mass lesion noted on this unenhanced exam. Major intracranial vascular structures are patent. Post lens replacement otherwise orbital structures unremarkable. Right C3-4 facet degenerative changes. Mild transverse ligament hypertrophy. Cervical medullary junction unremarkable. Pituitary and pineal region within normal limits. IMPRESSION: No acute infarct or intracranial hemorrhage. Global atrophy. Electronically Signed   By: Genia Del M.D.   On: 04/10/2015 13:11   I have personally reviewed and evaluated these images and lab results as part of my medical decision-making.   EKG Interpretation   Date/Time:  Monday April 10 2015 07:57:47 EST Ventricular Rate:  70 PR Interval:  271 QRS Duration: 145 QT Interval:  445 QTC Calculation: 480 R Axis:   -34 Text Interpretation:  Sinus rhythm Prolonged PR interval Right bundle  branch block No significant change since last tracing Confirmed by NGUYEN,  EMILY ([id number]) on 04/10/2015 8:07:44 AM      MDM   Final diagnoses:  Dizziness  CAP (community acquired pneumonia)   82 year old male with a history of diabetes, hypertension, myelodysplastic syndrome, NSTEMI (demand ischemia) presents with concern for dizziness. Patient reports that his first episode of dizziness was a few nights ago which resulted in a fall.  Patient denies any other neurologic symptoms, has a normal neurologic exam including normal coordination with the exception of ambulation and Romberg, initially unclear if secondary to severe peripheral vertigo or central etiology.  No CP/no SOB doubt MI/PE as cause of dizziness.   Head CT ordered showing no acute abnormalities. CBC and BMP showed mild anemia at baseline, no acute  electrolyte disturbances . Patient was given 500 mL of normal saline, and a dose of meclizine without significant improvement in dizziness.  Patient also reports cough and CXR concerning for pneumonia.  Blood cultures are drawn and patient given Rocephin and his affect azithromycin for concern of community-acquired pneumonia. Patient admitted to the hospitalist for continued pneumonia treatment, and inpt MRI to evaluate for stroke in setting of continued vertigo and difficulty walking.   Gareth Morgan, MD 04/10/15 1744

## 2015-04-10 NOTE — ED Notes (Signed)
Pt can go to [facility] at 12:35

## 2015-04-11 ENCOUNTER — Inpatient Hospital Stay (HOSPITAL_COMMUNITY): Payer: Medicare Other

## 2015-04-11 DIAGNOSIS — W19XXXA Unspecified fall, initial encounter: Secondary | ICD-10-CM

## 2015-04-11 LAB — CBC
HCT: 32.4 % — ABNORMAL LOW (ref 39.0–52.0)
Hemoglobin: 11 g/dL — ABNORMAL LOW (ref 13.0–17.0)
MCH: 30.2 pg (ref 26.0–34.0)
MCHC: 34 g/dL (ref 30.0–36.0)
MCV: 89 fL (ref 78.0–100.0)
PLATELETS: 90 10*3/uL — AB (ref 150–400)
RBC: 3.64 MIL/uL — AB (ref 4.22–5.81)
RDW: 13.5 % (ref 11.5–15.5)
WBC: 3.2 10*3/uL — ABNORMAL LOW (ref 4.0–10.5)

## 2015-04-11 LAB — COMPREHENSIVE METABOLIC PANEL
ALT: 12 U/L — ABNORMAL LOW (ref 17–63)
ANION GAP: 8 (ref 5–15)
AST: 15 U/L (ref 15–41)
Albumin: 3.4 g/dL — ABNORMAL LOW (ref 3.5–5.0)
Alkaline Phosphatase: 73 U/L (ref 38–126)
BUN: 14 mg/dL (ref 6–20)
CHLORIDE: 108 mmol/L (ref 101–111)
CO2: 25 mmol/L (ref 22–32)
Calcium: 8.3 mg/dL — ABNORMAL LOW (ref 8.9–10.3)
Creatinine, Ser: 0.86 mg/dL (ref 0.61–1.24)
GFR calc non Af Amer: >60 mL/min (ref >60)
Glucose, Bld: 114 mg/dL — ABNORMAL HIGH (ref 65–99)
POTASSIUM: 4 mmol/L (ref 3.5–5.1)
SODIUM: 141 mmol/L (ref 135–145)
Total Bilirubin: 0.8 mg/dL (ref 0.3–1.2)
Total Protein: 5.8 g/dL — ABNORMAL LOW (ref 6.5–8.1)

## 2015-04-11 LAB — MAGNESIUM: MAGNESIUM: 1.7 mg/dL (ref 1.7–2.4)

## 2015-04-11 LAB — TSH: TSH: 3.377 u[IU]/mL (ref 0.350–4.500)

## 2015-04-11 MED ORDER — AZITHROMYCIN 500 MG PO TABS
500.0000 mg | ORAL_TABLET | Freq: Every day | ORAL | Status: DC
  Administered 2015-04-12: 500 mg via ORAL
  Filled 2015-04-11: qty 1

## 2015-04-11 MED ORDER — MAGNESIUM SULFATE 2 GM/50ML IV SOLN
2.0000 g | Freq: Once | INTRAVENOUS | Status: AC
  Administered 2015-04-11: 2 g via INTRAVENOUS
  Filled 2015-04-11: qty 50

## 2015-04-11 NOTE — Progress Notes (Signed)
[name] [name] requested assistance with advance directive.     Wishes to ensure that he is an organ donor.  (He initially described this as "donate my body to science", but on further conversation, it was clear that he wants others to make use of his organs).    Pt's daughter present in room.  Provided education around advance directive paperwork with pt and daughter.  They are going to fill out paperwork together and chaplain will return to notarize.      [place], [place]

## 2015-04-11 NOTE — Progress Notes (Signed)
PHARMACIST - PHYSICIAN COMMUNICATION DR:   Erlinda Hong CONCERNING: Antibiotic IV to Oral Route Change Policy  RECOMMENDATION: This patient is receiving azithromycin by the intravenous route.  Based on criteria approved by the Pharmacy and Therapeutics Committee, the antibiotic(s) is/are being converted to the equivalent oral dose form(s).   DESCRIPTION: These criteria include:  Patient being treated for a respiratory tract infection, urinary tract infection, cellulitis or clostridium difficile associated diarrhea if on metronidazole  The patient is not neutropenic and does not exhibit a GI malabsorption state  The patient is eating (either orally or via tube) and/or has been taking other orally administered medications for a least 24 hours  The patient is improving clinically and has a Tmax < 100.5  If you have questions about this conversion, please contact the Pharmacy Department  []   [phone] )  [facility] []   [phone] )  [facility] []   [phone] )  [facility] []   [phone] )  [facility] [x]   [phone] )  [place], PharmD, BCPS 04/11/2015 10:26 AM

## 2015-04-11 NOTE — Progress Notes (Signed)
PROGRESS NOTE  [name] [name] [id number] DOB: 03/31/1918 DOA: 04/10/2015 PCP: Hoyt Koch, MD  HPI/Recap of past 24 hours:  Feeling better, less dizzy, less cough  Assessment/Plan: Active Problems:   CAP (community acquired pneumonia)  Dizziness:  severe , not able to ambulate initially ct head no acute findings, mri brain negative, echocardiog in 11/2016 unremarkable. ekg no acute findings, not orthostatic, but clinically dehydration, continue gentle hydration. better  CAP: continue rocephin and zithro which are started in the ED. Repeat cxr in am,   H/o falls (three falls in the last year): PT did not recommend home health.  H/o hypothyroidism: continue synthroid,  tsh 3.37  noninsulin dependent dm2, metformin held in the hospital, check a1c, carb modified diet.  H/o iron deficiency: continue iron supplement.  H/o MDS: counts stable.  DVT prophylaxis: lovenox  Consultants:   Code Status: DNR  Family Communication: Patient    Disposition Plan: home , likely 1/4   Consultants:  none  Procedures:  none  Antibiotics:  Rocephin/zithro   Objective: BP 155/80 mmHg  Pulse 72  Temp(Src) 98.6 F (37 C) (Oral)  Resp 20  Ht 5\' 9"  (1.753 m)  Wt 151 lb (68.493 kg)  BMI 22.29 kg/m2  SpO2 100%  Intake/Output Summary (Last 24 hours) at 04/11/15 2221 Last data filed at 04/11/15 1900  Gross per 24 hour  Intake   2090 ml  Output      0 ml  Net   2090 ml   Filed Weights   04/10/15 1341  Weight: 151 lb (68.493 kg)    Exam:   General:  NAD  Cardiovascular: RRR  Respiratory: CTABL  Abdomen: Soft/ND/NT, positive BS  Musculoskeletal: No Edema  Neuro:   Data Reviewed: Basic Metabolic Panel:  Recent Labs Lab 04/10/15 0800 04/11/15 0423  NA 142 141  K 4.1 4.0  CL 106 108  CO2 27 25  GLUCOSE 159* 114*  BUN 20 14  CREATININE 0.95 0.86  CALCIUM 8.6* 8.3*  MG  --  1.7   Liver Function Tests:  Recent Labs Lab  04/10/15 0800 04/11/15 0423  AST 17 15  ALT 14* 12*  ALKPHOS 78 73  BILITOT 0.6 0.8  PROT 6.6 5.8*  ALBUMIN 3.9 3.4*   No results for input(s): LIPASE, AMYLASE in the last 168 hours. No results for input(s): AMMONIA in the last 168 hours. CBC:  Recent Labs Lab 04/10/15 0800 04/11/15 0423  WBC 3.4* 3.2*  NEUTROABS 1.9  --   HGB 11.3* 11.0*  HCT 33.6* 32.4*  MCV 88.9 89.0  PLT 121* 90*   Cardiac Enzymes:    Recent Labs Lab 04/10/15 0800  TROPONINI <0.03   BNP (last 3 results) No results for input(s): BNP in the last 8760 hours.  ProBNP (last 3 results) No results for input(s): PROBNP in the last 8760 hours.  CBG: No results for input(s): GLUCAP in the last 168 hours.  Recent Results (from the past 240 hour(s))  Blood culture (routine x 2)     Status: None (Preliminary result)   Collection Time: 04/10/15 11:12 AM  Result Value Ref Range Status   Specimen Description BLOOD RIGHT ANTECUBITAL  Final   Special Requests   Final    BOTTLES DRAWN AEROBIC AND ANAEROBIC 10 CC BLUE, 5 CC RED   Culture   Final    NO GROWTH 1 DAY Performed at [facility]    Report Status PENDING  Incomplete  Blood culture (routine x  2)     Status: None (Preliminary result)   Collection Time: 04/10/15 11:22 AM  Result Value Ref Range Status   Specimen Description BLOOD RIGHT HAND  Final   Special Requests IN PEDIATRIC BOTTLE 3 CC  Final   Culture   Final    NO GROWTH 1 DAY Performed at [facility]    Report Status PENDING  Incomplete     Studies: No results found.  Scheduled Meds: . [START ON 04/12/2015] azithromycin  500 mg Oral Daily  . cefTRIAXone (ROCEPHIN)  IV  1 g Intravenous Q24H  . enoxaparin (LOVENOX) injection  40 mg Subcutaneous Q24H  . ferrous sulfate  325 mg Oral Q breakfast  . guaiFENesin  600 mg Oral BID  . levothyroxine  125 mcg Oral QAC breakfast  . sodium chloride  3 mL Intravenous Q12H    Continuous Infusions: . sodium chloride 75 mL/hr  at 04/11/15 0519     Time spent: 53mins  Noeh Sparacino MD, PhD  [facility] Pager [phone]. If 7PM-7AM, please contact night-coverage at [email or web address], password [email or web address] 04/11/2015, 10:21 PM  LOS: 1 day

## 2015-04-11 NOTE — Evaluation (Signed)
Physical Therapy Evaluation Patient Details Name: [name] [name] MRN: [id number] DOB: 1917-08-16 Today's Date: 04/11/2015   History of Present Illness  82 y.o. male with h/o DM, myelodysplasia, MI admitted with dizziness, fall, cough. Dx: PNA, dehydration.  Clinical Impression  Pt admitted with above diagnosis. Pt currently with functional limitations due to the deficits listed below (see PT Problem List). Pt ambulated 400' with RW independently, SaO2 93% on RA with walking, no dyspnea. Supervision for safety with transfers. Expect pt can DC home alone without f/u PT. No DME needs.  Pt will benefit from skilled PT to increase their independence and safety with mobility to allow discharge to the venue listed below.       Follow Up Recommendations No PT follow up    Equipment Recommendations  None recommended by PT    Recommendations for Other Services       Precautions / Restrictions Precautions Precautions: Fall Precaution Comments: pt reports 3 falls in past year Restrictions Weight Bearing Restrictions: No      Mobility  Bed Mobility               General bed mobility comments: NT- up on EOB  Transfers Overall transfer level: Needs assistance Equipment used: Rolling walker (2 wheeled) Transfers: Sit to/from Stand Sit to Stand: Supervision         General transfer comment: verbal cues for hand placment and to keep RW close while backing up to chair  Ambulation/Gait Ambulation/Gait assistance: Supervision Ambulation Distance (Feet): 400 Feet Assistive device: Rolling walker (2 wheeled) Gait Pattern/deviations: WFL(Within Functional Limits)   Gait velocity interpretation: at or above normal speed for age/gender General Gait Details: steady with RW, no LOB; SaO2 93% on RA walking, no dyspnea  Stairs            Wheelchair Mobility    Modified Rankin (Stroke Patients Only)       Balance Overall balance assessment: Modified Independent                                            Pertinent Vitals/Pain Pain Assessment: No/denies pain    Home Living Family/patient expects to be discharged to:: Private residence Living Arrangements: Alone Available Help at Discharge: Family;Available PRN/intermittently Type of Home: House Home Access: Stairs to enter Entrance Stairs-Rails: Right Entrance Stairs-Number of Steps: 2 Home Layout: One level Home Equipment: Walker - 2 wheels;Cane - single point;Shower seat;Grab bars - tub/shower Additional Comments: daughter lives in [place]    Prior Function Level of Independence: Independent with assistive device(s)         Comments: started using RW after a fall a couple days prior to admission, prior to that walked independently inside and used cane outside; doesn't drive, daughter comes weekly for grocery shopping, independent with showering     Hand Dominance        Extremity/Trunk Assessment   Upper Extremity Assessment: Overall WFL for tasks assessed           Lower Extremity Assessment: Overall WFL for tasks assessed      Cervical / Trunk Assessment: Normal  Communication   Communication: HOH  Cognition Arousal/Alertness: Awake/alert Behavior During Therapy: WFL for tasks assessed/performed Overall Cognitive Status: Within Functional Limits for tasks assessed                      General  Comments      Exercises        Assessment/Plan    PT Assessment Patient needs continued PT services  PT Diagnosis Generalized weakness   PT Problem List Decreased activity tolerance;Decreased knowledge of use of DME;Decreased safety awareness  PT Treatment Interventions Gait training;Stair training;Therapeutic exercise;Balance training;Patient/family education   PT Goals (Current goals can be found in the Care Plan section) Acute Rehab PT Goals Patient Stated Goal: to return home PT Goal Formulation: With patient Time For Goal Achievement:  04/25/15 Potential to Achieve Goals: Good    Frequency Min 3X/week   Barriers to discharge        Co-evaluation               End of Session Equipment Utilized During Treatment: Gait belt Activity Tolerance: Patient tolerated treatment well Patient left: in chair;with call bell/phone within reach;with chair alarm set           Time: [id number] PT Time Calculation (min) (ACUTE ONLY): 36 min   Charges:   PT Evaluation $PT Eval Low Complexity: 1 Procedure PT Treatments $Gait Training: 8-22 mins   PT G Codes:        [name] [name] 04/11/2015, 12:26 PM [phone]

## 2015-04-12 DIAGNOSIS — D509 Iron deficiency anemia, unspecified: Secondary | ICD-10-CM

## 2015-04-12 DIAGNOSIS — R42 Dizziness and giddiness: Secondary | ICD-10-CM

## 2015-04-12 DIAGNOSIS — R296 Repeated falls: Secondary | ICD-10-CM

## 2015-04-12 DIAGNOSIS — E039 Hypothyroidism, unspecified: Secondary | ICD-10-CM

## 2015-04-12 DIAGNOSIS — E119 Type 2 diabetes mellitus without complications: Secondary | ICD-10-CM

## 2015-04-12 DIAGNOSIS — J189 Pneumonia, unspecified organism: Principal | ICD-10-CM

## 2015-04-12 LAB — BASIC METABOLIC PANEL
ANION GAP: 6 (ref 5–15)
BUN: 15 mg/dL (ref 6–20)
CO2: 26 mmol/L (ref 22–32)
Calcium: 8.4 mg/dL — ABNORMAL LOW (ref 8.9–10.3)
Chloride: 109 mmol/L (ref 101–111)
Creatinine, Ser: 1.04 mg/dL (ref 0.61–1.24)
GFR, EST NON AFRICAN AMERICAN: 59 mL/min — AB (ref >60)
GLUCOSE: 119 mg/dL — AB (ref 65–99)
POTASSIUM: 4.1 mmol/L (ref 3.5–5.1)
SODIUM: 141 mmol/L (ref 135–145)

## 2015-04-12 LAB — CBC
HCT: 31.7 % — ABNORMAL LOW (ref 39.0–52.0)
Hemoglobin: 10.8 g/dL — ABNORMAL LOW (ref 13.0–17.0)
MCH: 29.9 pg (ref 26.0–34.0)
MCHC: 34.1 g/dL (ref 30.0–36.0)
MCV: 87.8 fL (ref 78.0–100.0)
PLATELETS: 95 10*3/uL — AB (ref 150–400)
RBC: 3.61 MIL/uL — AB (ref 4.22–5.81)
RDW: 13.3 % (ref 11.5–15.5)
WBC: 3.1 10*3/uL — AB (ref 4.0–10.5)

## 2015-04-12 LAB — MAGNESIUM: MAGNESIUM: 2.2 mg/dL (ref 1.7–2.4)

## 2015-04-12 LAB — HEMOGLOBIN A1C
HEMOGLOBIN A1C: 6.3 % — AB (ref 4.8–5.6)
MEAN PLASMA GLUCOSE: 134 mg/dL

## 2015-04-12 MED ORDER — CEFUROXIME AXETIL 250 MG PO TABS: 250.0000 mg | ORAL_TABLET | Freq: Two times a day (BID) | ORAL | Status: DC

## 2015-04-12 MED ORDER — GUAIFENESIN ER 600 MG PO TB12: 600.0000 mg | ORAL_TABLET | Freq: Two times a day (BID) | ORAL | Status: DC

## 2015-04-12 MED ORDER — AZITHROMYCIN 500 MG PO TABS: 500.0000 mg | ORAL_TABLET | Freq: Every day | ORAL | Status: DC

## 2015-04-12 NOTE — Discharge Summary (Signed)
Physician Discharge Summary  [name] [name] [id number] DOB: May 19, 1917 DOA: 04/10/2015  PCP: Hoyt Koch, MD  Admit date: 04/10/2015 Discharge date: 04/12/2015  Time spent: 45 minutes  Recommendations for Outpatient Follow-up:  Patient will be discharged to home with home health RN.  Patient will need to follow up with primary care provider within one week of discharge.  Patient should continue medications as prescribed.  Patient should follow a heart healthy/carb modified diet.   Discharge Diagnoses:  Dizziness Community acquired pneumonia History of falls Hypothyroidism Diabetes mellitus, type II Iron deficiency anemia History of MDS  Discharge Condition: Stable  Diet recommendation: Heart healthy/carb modified  Filed Weights   04/10/15 1341  Weight: 68.493 kg (151 lb)    History of present illness:  On 04/10/2015 by Dr. Florencia Reasons [name] [name] is a 82 y.o. male  Who at baseline very function, lives by himself, brought to the ED due to above complaints. He denies fever, no sick contact, denies chest pain, no running nose, no sore throat, he reported productive cough for the last two weeks. This morning he felt dizzy, he called his daughter who called the EMS. In the ED , CT head negative, cxr with possible pna, he was given rocephin and zithromax, hospitalist called to admit the patient  Hospital Course:  Dizziness -Admission, patient was very weak and unable to ambulate -CT head: stable atrophy otherwise normal exam -MRI brain: No acute infarct or intracranial hemorrhage -Echocardiogram 11/25/2014: EF [id number], grade 1 diastolic dysfunction -Orthostatic vitals negative -Upon admission, patient's son to be dehydrated clinically, was given IV hydration -PT evaluate patient, patient did not need any therapy needs -Will discharge patient to home with home health RN.  Community acquired pneumonia -Chest x-ray 04/11/2015 shows improvement -Initially placed on  azithromycin and Rocephin -Will discharge patient with azithromycin and Ceftin -Continue Mucinex  History of falls -Patient has had 3 falls in the past year -Patient was seen by physical therapy did not recommend mouth  Hypothyroidism -TSH 3.37, continue Synthroid  Diabetes mellitus, type II -Metformin initially held on admission, may resume discharge -Hemoglobin A1c 6.3  Iron deficiency anemia -Stable, continue iron supplementation  History of MDS -Stable  Procedures: None   Consultations: None  Discharge Exam: Filed Vitals:   04/12/15 0522 04/12/15 1033  BP: 143/85 152/63  Pulse: 74 82  Temp: 98 F (36.7 C) 98 F (36.7 C)  Resp: 20 20     General: Well developed, well nourished, NAD, appears stated age  HEENT: NCAT, mucous membranes moist.  Cardiovascular: S1 S2 auscultated, RRR.  Respiratory: Clear to auscultation bilaterally with equal chest rise  Abdomen: Soft, nontender, nondistended, + bowel sounds  Extremities: warm dry without cyanosis clubbing or edema  Neuro: AAOx3, nonfocal  Psych: Normal affect and demeanor  Discharge Instructions      Discharge Instructions    Discharge instructions    Complete by:  As directed   Patient will be discharged to home with home health RN.  Patient will need to follow up with primary care provider within one week of discharge.  Patient should continue medications as prescribed.  Patient should follow a heart healthy/carb modified diet.            Medication List    STOP taking these medications        polyethylene glycol powder powder  Commonly known as:  GLYCOLAX/MIRALAX      TAKE these medications        aspirin EC  81 MG tablet  Take 1 tablet (81 mg total) by mouth daily.     azithromycin 500 MG tablet  Commonly known as:  ZITHROMAX  Take 1 tablet (500 mg total) by mouth daily.     bisacodyl 5 MG EC tablet  Generic drug:  bisacodyl  Take 5 mg by mouth daily as needed for mild  constipation or moderate constipation. Reported on 04/10/2015     cefUROXime 250 MG tablet  Commonly known as:  CEFTIN  Take 1 tablet (250 mg total) by mouth 2 (two) times daily with a meal.     docusate sodium 100 MG capsule  Commonly known as:  COLACE  Take 100 mg by mouth daily.     ferrous sulfate 325 (65 FE) MG tablet  Take 1 tablet (325 mg total) by mouth daily with breakfast.     guaiFENesin 600 MG 12 hr tablet  Commonly known as:  MUCINEX  Take 1 tablet (600 mg total) by mouth 2 (two) times daily.     levothyroxine 125 MCG tablet  Commonly known as:  SYNTHROID, LEVOTHROID  Take 1 tablet (125 mcg total) by mouth daily.     MENS ONE DAILY PO  Take 1 tablet by mouth daily.     metFORMIN 500 MG tablet  Commonly known as:  GLUCOPHAGE  Take 1 tablet (500 mg total) by mouth 2 (two) times daily.       Allergies  Allergen Reactions  . Statins     Age>90   Follow-up Information    Follow up with Hoyt Koch, MD. Schedule an appointment as soon as possible for a visit in 1 week.   Specialty:  Internal Medicine   Why:  Hospital follow up   Contact information:   [place] [address] [phone]        The results of significant diagnostics from this hospitalization (including imaging, microbiology, ancillary and laboratory) are listed below for reference.    Significant Diagnostic Studies: Dg Chest 2 View  04/11/2015  CLINICAL DATA:  Pneumonia. EXAM: CHEST  2 VIEW COMPARISON:  Radiographs obtained yesterday. FINDINGS: Previous right upper lobe opacity is no longer visualized. The lungs are hyperinflated. No new consolidation. Calcified granuloma in the left midlung, unchanged. The heart is normal in size. No pulmonary edema, pleural effusion or pneumothorax. No acute osseous abnormalities. IMPRESSION: Improved right upper lobe opacity consistent with resolving pneumonia. Electronically Signed   By: Jeb Levering M.D.   On: 04/11/2015 23:30     Dg Chest 2 View  04/10/2015  CLINICAL DATA:  82 year old male with cough and recent fall. EXAM: CHEST  2 VIEW COMPARISON:  10/31/2014 and prior chest radiographs dating back to 2011. FINDINGS: Mild cardiomegaly noted. Patchy opacity overlying the lateral right mid lung is identified. There is no evidence of pleural effusion, pulmonary edema, pneumothorax or acute bony abnormality. IMPRESSION: Patchy right mid lung opacity which may represent pneumonia. Radiographic follow-up to resolution is recommended. Electronically Signed   By: Margarette Canada M.D.   On: 04/10/2015 09:07   Ct Head Wo Contrast  04/10/2015  CLINICAL DATA:  Unsteady gait and leg weakness. The patient fell 2 days ago. EXAM: CT HEAD WITHOUT CONTRAST TECHNIQUE: Contiguous axial images were obtained from the base of the skull through the vertex without intravenous contrast. COMPARISON:  06/06/2013 FINDINGS: No mass lesion. No midline shift. No acute hemorrhage or hematoma. No extra-axial fluid collections. No evidence of acute infarction. There is diffuse moderate  cerebral cortical and cerebellar atrophy with secondary ventricular dilatation. Osseous structures are normal. IMPRESSION: Stable atrophy.  Otherwise, normal exam. Electronically Signed   By: Lorriane Shire M.D.   On: 04/10/2015 08:57   Mr Brain Wo Contrast  04/10/2015  CLINICAL DATA:  82 year old hypertensive diabetic male with fall 2 days ago after waking up. Did not lose consciousness. May have bump head. Prior episode of syncope 3 years ago. Prostate cancer 1984. Subsequent encounter. EXAM: MRI HEAD WITHOUT CONTRAST TECHNIQUE: Multiplanar, multiecho pulse sequences of the brain and surrounding structures were obtained without intravenous contrast. COMPARISON:  04/10/2015 CT.  No comparison MR. FINDINGS: No acute infarct. No intracranial hemorrhage. Global atrophy without hydrocephalus. No intracranial mass lesion noted on this unenhanced exam. Major intracranial vascular structures  are patent. Post lens replacement otherwise orbital structures unremarkable. Right C3-4 facet degenerative changes. Mild transverse ligament hypertrophy. Cervical medullary junction unremarkable. Pituitary and pineal region within normal limits. IMPRESSION: No acute infarct or intracranial hemorrhage. Global atrophy. Electronically Signed   By: Genia Del M.D.   On: 04/10/2015 13:11    Microbiology: Recent Results (from the past 240 hour(s))  Blood culture (routine x 2)     Status: None (Preliminary result)   Collection Time: 04/10/15 11:12 AM  Result Value Ref Range Status   Specimen Description BLOOD RIGHT ANTECUBITAL  Final   Special Requests   Final    BOTTLES DRAWN AEROBIC AND ANAEROBIC 10 CC BLUE, 5 CC RED   Culture   Final    NO GROWTH 1 DAY Performed at [facility]    Report Status PENDING  Incomplete  Blood culture (routine x 2)     Status: None (Preliminary result)   Collection Time: 04/10/15 11:22 AM  Result Value Ref Range Status   Specimen Description BLOOD RIGHT HAND  Final   Special Requests IN PEDIATRIC BOTTLE 3 CC  Final   Culture   Final    NO GROWTH 1 DAY Performed at [facility]    Report Status PENDING  Incomplete     Labs: Basic Metabolic Panel:  Recent Labs Lab 04/10/15 0800 04/11/15 0423 04/12/15 0518  NA 142 141 141  K 4.1 4.0 4.1  CL 106 108 109  CO2 27 25 26   GLUCOSE 159* 114* 119*  BUN 20 14 15   CREATININE 0.95 0.86 1.04  CALCIUM 8.6* 8.3* 8.4*  MG  --  1.7 2.2   Liver Function Tests:  Recent Labs Lab 04/10/15 0800 04/11/15 0423  AST 17 15  ALT 14* 12*  ALKPHOS 78 73  BILITOT 0.6 0.8  PROT 6.6 5.8*  ALBUMIN 3.9 3.4*   No results for input(s): LIPASE, AMYLASE in the last 168 hours. No results for input(s): AMMONIA in the last 168 hours. CBC:  Recent Labs Lab 04/10/15 0800 04/11/15 0423 04/12/15 0518  WBC 3.4* 3.2* 3.1*  NEUTROABS 1.9  --   --   HGB 11.3* 11.0* 10.8*  HCT 33.6* 32.4* 31.7*  MCV 88.9  89.0 87.8  PLT 121* 90* 95*   Cardiac Enzymes:  Recent Labs Lab 04/10/15 0800  TROPONINI <0.03   BNP: BNP (last 3 results) No results for input(s): BNP in the last 8760 hours.  ProBNP (last 3 results) No results for input(s): PROBNP in the last 8760 hours.  CBG: No results for input(s): GLUCAP in the last 168 hours.     SignedCristal Ford  [facility] 04/12/2015, 11:01 AM

## 2015-04-12 NOTE — Care Management Note (Signed)
Case Management Note  Patient Details  Name: [name] [name] MRN: [id number] Date of Birth: Aug 31, 1917  Subjective/Objective:  HHRN ordered. [facility] for [facility]. [facility] rep Santiago Glad aware of d/c order, & HHC order.                  Action/Plan:d/c home w/HHC.   Expected Discharge Date:                 Expected Discharge Plan:  [name]  In-House Referral:     Discharge planning Services  CM Consult  Post Acute Care Choice:    Choice offered to:  Patient  DME Arranged:    DME Agency:     HH Arranged:  RN [place] Agency:  [facility]  Status of Service:  Completed, signed off  Medicare Important Message Given:    Date Medicare IM Given:    Medicare IM give by:    Date Additional Medicare IM Given:    Additional Medicare Important Message give by:     If discussed at [place] of Stay Meetings, dates discussed:    Additional Comments:  Dessa Phi, RN 04/12/2015, 11:25 AM

## 2015-04-12 NOTE — Discharge Instructions (Signed)

## 2015-04-12 NOTE — Progress Notes (Signed)
   04/12/15 1600  Advance Directives (For Healthcare)  Does patient have an advance directive? Yes  Type of Advance Directive Living will;Healthcare Power of Attorney    Chaplain follow up to complete and notarize advance directive (Living Will and [place])    Pt Alert and Oriented, signed document.  Chaplain notarized.  Original and two copies given to pt and daughter.  Copy placed in pt chart.

## 2015-04-13 ENCOUNTER — Telehealth: Payer: Self-pay | Admitting: *Deleted

## 2015-04-13 NOTE — Telephone Encounter (Signed)
Transition Care Management Follow-up Telephone Call   Date discharged? 04/12/15   How have you been since you were released from the hospital? Spoke with pt but he stated he couldn't hear me to talk with the nurse (diane) that was their. Spoke with nurse she stated she was assessing him she is with advance. He seem to be doing ok   Do you understand why you were in the hospital? YES   Do you understand the discharge instructions? YES   Where were you discharged to? [facility]   Items Reviewed:  Medications reviewed: YES  Allergies reviewed: YES  Dietary changes reviewed: YES  Referrals reviewed: YES, HH which advance came out today   Functional Questionnaire:   Activities of Daily Living (ADLs):   He states he are independent in the following: ambulation, bathing and hygiene, feeding, continence, grooming, toileting and dressing States he require assistance with the following: helping with medicine & hearing   Any transportation issues/concerns?: NO   Any patient concerns? NO   Confirmed importance and date/time of follow-up visits scheduled YES, appt 04/20/15  Provider Appointment booked with Dr. Sharlet Salina  Confirmed with patient if condition begins to worsen call PCP or go to the ER.  Patient was given the office number and encouraged to call back with question or concerns.  : YES

## 2015-04-15 LAB — CULTURE, BLOOD (ROUTINE X 2)
CULTURE: NO GROWTH
Culture: NO GROWTH

## 2015-04-20 ENCOUNTER — Telehealth: Payer: Self-pay | Admitting: Internal Medicine

## 2015-04-20 ENCOUNTER — Inpatient Hospital Stay: Payer: Medicare Other | Admitting: Internal Medicine

## 2015-04-20 NOTE — Telephone Encounter (Signed)
Would need visit as I have never seen (former [place]) and has not been seen here since last summer. Schedule acute visit with someone.

## 2015-04-20 NOTE — Telephone Encounter (Signed)
Patient was released on the 4th from the hospital with pneumonia.  States patient still sounds congestive and is coughing up stuff.  Is requesting another round out antibiotic.

## 2015-04-21 NOTE — Telephone Encounter (Signed)
Patient has appt to establish care with Sharlet Salina on 1/23.  I left vm for daughter to call us back if she would like to bring patient in on Saturday clinic.

## 2015-04-22 ENCOUNTER — Ambulatory Visit (INDEPENDENT_AMBULATORY_CARE_PROVIDER_SITE_OTHER): Payer: Medicare Other | Admitting: Internal Medicine

## 2015-04-22 VITALS — BP 150/80 | HR 76 | Temp 97.9°F | Resp 18 | Wt 151.0 lb

## 2015-04-22 DIAGNOSIS — J189 Pneumonia, unspecified organism: Secondary | ICD-10-CM

## 2015-04-22 MED ORDER — HYDROCODONE-HOMATROPINE 5-1.5 MG/5ML PO SYRP: 5.0000 mL | ORAL_SOLUTION | Freq: Four times a day (QID) | ORAL | Status: DC | PRN

## 2015-04-22 MED ORDER — LEVOFLOXACIN 250 MG PO TABS: 250.0000 mg | ORAL_TABLET | Freq: Every day | ORAL | Status: DC

## 2015-04-22 NOTE — Assessment & Plan Note (Signed)
?   Suspicion for aspiration related to age, due to recurrent illness it seems just after recent tx for CAP with RUL infiltrate on cxr, for levaquin, cough med prn, f/u CXR when able (closed now, for mon jan 16), consider swallow study if has recurrent resp symptoms in future

## 2015-04-22 NOTE — Progress Notes (Signed)
Pre visit review using our clinic review tool, if applicable. No additional management support is needed unless otherwise documented below in the visit note. 

## 2015-04-22 NOTE — Patient Instructions (Signed)
Please take all new medication as prescribed - the antibiotic, and cough medicine if needed  Please continue all other medications as before, and refills have been done if requested.  Please have the pharmacy call with any other refills you may need.  Please keep your appointments with your specialists as you may have planned  Please go to the XRAY Department in the Basement (go straight as you get off the elevator) for the x-ray testing - for Monday Jan 16  You will be contacted by phone if any changes need to be made immediately.  Otherwise, you will receive a letter about your results with an explanation, but please check with MyChart first.  Please remember to sign up for MyChart if you have not done so, as this will be important to you in the future with finding out test results, communicating by private email, and scheduling acute appointments online when needed.

## 2015-04-22 NOTE — Progress Notes (Signed)
Subjective:    Patient ID: [name] [name], male    DOB: 1917/10/21, 82 y.o.   MRN: [id number]  HPI  Here with acute onset mild to mod 2-3 days ST, HA, general weakness and malaise, with prod cough greenish sputum, but Pt denies chest pain, increased sob or doe, wheezing, orthopnea, PND, increased LE swelling, palpitations, dizziness or syncope.  Has hx of recent pna RUL by cxr jan 3, finished ceftin.  No hx of stroke or known aspiration. Denies worsening reflux, abd pain, dysphagia, n/v, bowel change or blood.Pt denies new neurological symptoms such as new headache, or facial or extremity weakness or numbness   Pt denies polydipsia, polyuria Past Medical History  Diagnosis Date  . Arthritis   . Diabetes mellitus   . Heart murmur   . Hypertension   . Hypothyroid   . Prostate cancer ([place]) 1998 dx  . MDS (myelodysplastic syndrome) ([place]) 12/2006     hyperplastic on bone marrow bx 2008  . NSTEMI (non-ST elevation myocardial infarction) ([place]) 06/2013    demand ischemia in setting of sepsis   Past Surgical History  Procedure Laterality Date  . Appendectomy    . Total knee arthroplasty  2005    left    reports that he has never smoked. He has never used smokeless tobacco. He reports that he does not drink alcohol or use illicit drugs. family history includes Prostate cancer (age of onset: 59) in his father. Allergies  Allergen Reactions  . Statins     Age>90   Current Outpatient Prescriptions on File Prior to Visit  Medication Sig Dispense Refill  . aspirin EC 81 MG tablet Take 1 tablet (81 mg total) by mouth daily. 150 tablet 2  . azithromycin (ZITHROMAX) 500 MG tablet Take 1 tablet (500 mg total) by mouth daily. 4 tablet 0  . bisacodyl (BISACODYL) 5 MG EC tablet Take 5 mg by mouth daily as needed for mild constipation or moderate constipation. Reported on 04/10/2015    . cefUROXime (CEFTIN) 250 MG tablet Take 1 tablet (250 mg total) by mouth 2 (two) times daily with a meal. 8 tablet 0    . docusate sodium (COLACE) 100 MG capsule Take 100 mg by mouth daily.    . ferrous sulfate 325 (65 FE) MG tablet Take 1 tablet (325 mg total) by mouth daily with breakfast. 30 tablet 3  . guaiFENesin (MUCINEX) 600 MG 12 hr tablet Take 1 tablet (600 mg total) by mouth 2 (two) times daily. 14 tablet 0  . levothyroxine (SYNTHROID, LEVOTHROID) 125 MCG tablet Take 1 tablet (125 mcg total) by mouth daily. 90 tablet 3  . metFORMIN (GLUCOPHAGE) 500 MG tablet Take 1 tablet (500 mg total) by mouth 2 (two) times daily. 180 tablet 3  . Multiple Vitamins-Minerals (MENS ONE DAILY PO) Take 1 tablet by mouth daily.     No current facility-administered medications on file prior to visit.   Review of Systems  Constitutional: Negative for unusual diaphoresis or night sweats HENT: Negative for ringing in ear or discharge Eyes: Negative for double vision or worsening visual disturbance.  Respiratory: Negative for choking and stridor.   Gastrointestinal: Negative for vomiting or other signifcant bowel change Genitourinary: Negative for hematuria or change in urine volume.  Musculoskeletal: Negative for other MSK pain or swelling Skin: Negative for color change and worsening wound.  Neurological: Negative for tremors and numbness other than noted  Psychiatric/Behavioral: Negative for decreased concentration or agitation other than above  Objective:   Physical Exam BP 150/80 mmHg  Pulse 76  Temp(Src) 97.9 F (36.6 C) (Oral)  Resp 18  Wt 151 lb (68.493 kg)  SpO2 97% VS noted, elderly, thin, frail, refuses to walk with cane, pushed off to stand without assist, gets up on exam table with minimal assist, does have some unsteady gait; feels warm today on exam Constitutional: Pt appears in no significant distress HENT: Head: NCAT.  Right Ear: External ear normal.  Left Ear: External ear normal.  Bilat tm's with mild erythema left > right.  Max sinus areas non tender.  Pharynx with mild erythema, no  exudate Eyes: . Pupils are equal, round, and reactive to light. Conjunctivae and EOM are normal Neck: Normal range of motion. Neck supple.  Cardiovascular: Normal rate and regular rhythm.   Pulmonary/Chest: Effort normal and breath sounds without overt rales or wheezing.  Neurological: Pt is alert. Not confused , motor grossly intact, severe HOH Skin: Skin is warm. No rash, no LE edema Psychiatric: Pt behavior is normal. No agitation.     Assessment & Plan:

## 2015-04-25 ENCOUNTER — Telehealth: Payer: Self-pay | Admitting: Internal Medicine

## 2015-04-25 NOTE — Telephone Encounter (Signed)
Error

## 2015-04-26 ENCOUNTER — Encounter: Payer: Self-pay | Admitting: Internal Medicine

## 2015-04-26 ENCOUNTER — Ambulatory Visit (INDEPENDENT_AMBULATORY_CARE_PROVIDER_SITE_OTHER)
Admission: RE | Admit: 2015-04-26 | Discharge: 2015-04-26 | Disposition: A | Discharge status: Home / Self Care | Payer: Medicare Other | Source: Ambulatory Visit | Attending: Internal Medicine | Admitting: Internal Medicine

## 2015-04-26 DIAGNOSIS — J189 Pneumonia, unspecified organism: Secondary | ICD-10-CM

## 2015-05-01 ENCOUNTER — Ambulatory Visit (INDEPENDENT_AMBULATORY_CARE_PROVIDER_SITE_OTHER): Payer: Medicare Other | Admitting: Internal Medicine

## 2015-05-01 ENCOUNTER — Encounter: Payer: Self-pay | Admitting: Internal Medicine

## 2015-05-01 ENCOUNTER — Ambulatory Visit: Payer: Medicare Other | Admitting: Internal Medicine

## 2015-05-01 VITALS — BP 142/80 | HR 91 | Temp 98.0°F | Resp 12 | Wt 152.8 lb

## 2015-05-01 DIAGNOSIS — E039 Hypothyroidism, unspecified: Secondary | ICD-10-CM

## 2015-05-01 DIAGNOSIS — Z0000 Encounter for general adult medical examination without abnormal findings: Secondary | ICD-10-CM

## 2015-05-01 DIAGNOSIS — E1141 Type 2 diabetes mellitus with diabetic mononeuropathy: Secondary | ICD-10-CM

## 2015-05-01 DIAGNOSIS — R42 Dizziness and giddiness: Secondary | ICD-10-CM

## 2015-05-01 DIAGNOSIS — J189 Pneumonia, unspecified organism: Secondary | ICD-10-CM

## 2015-05-01 MED ORDER — METFORMIN HCL 500 MG PO TABS: 500.0000 mg | ORAL_TABLET | Freq: Every day | ORAL | Status: DC

## 2015-05-01 NOTE — Progress Notes (Signed)
Pre visit review using our clinic review tool, if applicable. No additional management support is needed unless otherwise documented below in the visit note. 

## 2015-05-01 NOTE — Assessment & Plan Note (Signed)
Recent TSH without indication for change and no symptoms of over or under replacement. Continue synthroid 125 mcg daily.

## 2015-05-01 NOTE — Assessment & Plan Note (Signed)
Resolved and off antibiotics at this time. Continue to monitor and if recurrent symptoms needs additional chest x-ray. No SOB.

## 2015-05-01 NOTE — Patient Instructions (Addendum)
We do not need blood work today.   We have sent in a new prescription for the metformin so that you only have to take it once a day.   Work on eating more salty foods to help keep the blood pressure a little higher to help with the dizziness.   Health Maintenance, Male A healthy lifestyle and preventative care can promote health and wellness.  Maintain regular health, dental, and eye exams.  Eat a healthy diet. Foods like vegetables, fruits, whole grains, low-fat dairy products, and lean protein foods contain the nutrients you need and are low in calories. Decrease your intake of foods high in solid fats, added sugars, and salt. Get information about a proper diet from your health care provider, if necessary.  Regular physical exercise is one of the most important things you can do for your health. Most adults should get at least 150 minutes of moderate-intensity exercise (any activity that increases your heart rate and causes you to sweat) each week. In addition, most adults need muscle-strengthening exercises on 2 or more days a week.   Maintain a healthy weight. The body mass index (BMI) is a screening tool to identify possible weight problems. It provides an estimate of body fat based on height and weight. Your health care provider can find your BMI and can help you achieve or maintain a healthy weight. For males 20 years and older:  A BMI below 18.5 is considered underweight.  A BMI of 18.5 to 24.9 is normal.  A BMI of 25 to 29.9 is considered overweight.  A BMI of 30 and above is considered obese.  Maintain normal blood lipids and cholesterol by exercising and minimizing your intake of saturated fat. Eat a balanced diet with plenty of fruits and vegetables. Blood tests for lipids and cholesterol should begin at age 6 and be repeated every 5 years. If your lipid or cholesterol levels are high, you are over age 32, or you are at high risk for heart disease, you may need your  cholesterol levels checked more frequently.Ongoing high lipid and cholesterol levels should be treated with medicines if diet and exercise are not working.  If you smoke, find out from your health care provider how to quit. If you do not use tobacco, do not start.  Lung cancer screening is recommended for adults aged 64-80 years who are at high risk for developing lung cancer because of a history of smoking. A yearly low-dose CT scan of the lungs is recommended for people who have at least a 30-pack-year history of smoking and are current smokers or have quit within the past 15 years. A pack year of smoking is smoking an average of 1 pack of cigarettes a day for 1 year (for example, a 30-pack-year history of smoking could mean smoking 1 pack a day for 30 years or 2 packs a day for 15 years). Yearly screening should continue until the smoker has stopped smoking for at least 15 years. Yearly screening should be stopped for people who develop a health problem that would prevent them from having lung cancer treatment.  If you choose to drink alcohol, do not have more than 2 drinks per day. One drink is considered to be 12 oz (360 mL) of beer, 5 oz (150 mL) of wine, or 1.5 oz (45 mL) of liquor.  Avoid the use of street drugs. Do not share needles with anyone. Ask for help if you need support or instructions about stopping the use  of drugs.  High blood pressure causes heart disease and increases the risk of stroke. High blood pressure is more likely to develop in:  People who have blood pressure in the end of the normal range (100-139/85-89 mm Hg).  People who are overweight or obese.  People who are African American.  If you are 32-80 years of age, have your blood pressure checked every 3-5 years. If you are 74 years of age or older, have your blood pressure checked every year. You should have your blood pressure measured twice--once when you are at a hospital or clinic, and once when you are not at a  hospital or clinic. Record the average of the two measurements. To check your blood pressure when you are not at a hospital or clinic, you can use:  An automated blood pressure machine at a pharmacy.  A home blood pressure monitor.  If you are 69-62 years old, ask your health care provider if you should take aspirin to prevent heart disease.  Diabetes screening involves taking a blood sample to check your fasting blood sugar level. This should be done once every 3 years after age 48 if you are at a normal weight and without risk factors for diabetes. Testing should be considered at a younger age or be carried out more frequently if you are overweight and have at least 1 risk factor for diabetes.  Colorectal cancer can be detected and often prevented. Most routine colorectal cancer screening begins at the age of 44 and continues through age 34. However, your health care provider may recommend screening at an earlier age if you have risk factors for colon cancer. On a yearly basis, your health care provider may provide home test kits to check for hidden blood in the stool. A small camera at the end of a tube may be used to directly examine the colon (sigmoidoscopy or colonoscopy) to detect the earliest forms of colorectal cancer. Talk to your health care provider about this at age 51 when routine screening begins. A direct exam of the colon should be repeated every 5-10 years through age 37, unless early forms of precancerous polyps or small growths are found.  People who are at an increased risk for hepatitis B should be screened for this virus. You are considered at high risk for hepatitis B if:  You were born in a country where hepatitis B occurs often. Talk with your health care provider about which countries are considered high risk.  Your parents were born in a high-risk country and you have not received a shot to protect against hepatitis B (hepatitis B vaccine).  You have HIV or AIDS.  You  use needles to inject street drugs.  You live with, or have sex with, someone who has hepatitis B.  You are a man who has sex with other men (MSM).  You get hemodialysis treatment.  You take certain medicines for conditions like cancer, organ transplantation, and autoimmune conditions.  Hepatitis C blood testing is recommended for all people born from [birth year] through 1963 and any individual with known risk factors for hepatitis C.  Healthy men should no longer receive prostate-specific antigen (PSA) blood tests as part of routine cancer screening. Talk to your health care provider about prostate cancer screening.  Testicular cancer screening is not recommended for adolescents or adult males who have no symptoms. Screening includes self-exam, a health care provider exam, and other screening tests. Consult with your health care provider about any symptoms you  have or any concerns you have about testicular cancer.  Practice safe sex. Use condoms and avoid high-risk sexual practices to reduce the spread of sexually transmitted infections (STIs).  You should be screened for STIs, including gonorrhea and chlamydia if:  You are sexually active and are younger than 24 years.  You are older than 24 years, and your health care provider tells you that you are at risk for this type of infection.  Your sexual activity has changed since you were last screened, and you are at an increased risk for chlamydia or gonorrhea. Ask your health care provider if you are at risk.  If you are at risk of being infected with HIV, it is recommended that you take a prescription medicine daily to prevent HIV infection. This is called pre-exposure prophylaxis (PrEP). You are considered at risk if:  You are a man who has sex with other men (MSM).  You are a heterosexual man who is sexually active with multiple partners.  You take drugs by injection.  You are sexually active with a partner who has HIV.  Talk with  your health care provider about whether you are at high risk of being infected with HIV. If you choose to begin PrEP, you should first be tested for HIV. You should then be tested every 3 months for as long as you are taking PrEP.  Use sunscreen. Apply sunscreen liberally and repeatedly throughout the day. You should seek shade when your shadow is shorter than you. Protect yourself by wearing long sleeves, pants, a wide-brimmed hat, and sunglasses year round whenever you are outdoors.  Tell your health care provider of new moles or changes in moles, especially if there is a change in shape or color. Also, tell your health care provider if a mole is larger than the size of a pencil eraser.  A one-time screening for abdominal aortic aneurysm (AAA) and surgical repair of large AAAs by ultrasound is recommended for men aged 39-75 years who are current or former smokers.  Stay current with your vaccines (immunizations).   This information is not intended to replace advice given to you by your health care provider. Make sure you discuss any questions you have with your health care provider.   Document Released: 09/21/2007 Document Revised: 04/15/2014 Document Reviewed: 08/20/2010 Elsevier Interactive Patient Education [employer].

## 2015-05-01 NOTE — Assessment & Plan Note (Signed)
I suspect this is going on longer than stated. Not on BP meds. Likely some orthostatic hypotension in the morning and advised him to liberalize his salt intake.

## 2015-05-01 NOTE — Progress Notes (Signed)
   Subjective:    Patient ID: [name] [name], male    DOB: 1917/04/14, 82 y.o.   MRN: [id number]  HPI Here for medicare wellness. Please see A/P for status and treatment of chronic medical problems.   HPI #2: He is following up on his previous CAP diagnosis. Took last levaquin this morning and overall feeling much better. Still mild cough, no SOB, no fevers or chills. Also having some dizziness in the morning for the last several months. If he sits on the bed for 5 minutes prior to standing does better. Would like to get it to stop. No BP meds. Does not happen during the day. He has fallen once going to the bathroom due to this. Denies serious injury from this and did not seek care at that time.   Diet: DM since diabetic Physical activity: sedentary Depression/mood screen: negative Hearing: moderate loss, declines hearing aids Visual acuity: grossly normal, performs annual eye exam  ADLs: capable Fall risk: low Home safety: good Cognitive evaluation: intact to orientation, naming, recall and repetition EOL planning: adv directives discussed  I have personally reviewed and have noted 1. The patient's medical and social history - reviewed today no changes 2. Their use of alcohol, tobacco or illicit drugs 3. Their current medications and supplements 4. The patient's functional ability including ADL's, fall risks, home safety risks and hearing or visual impairment. 5. Diet and physical activities 6. Evidence for depression or mood disorders 7. Care team reviewed and updated (available in snapshot)  Review of Systems  Constitutional: Negative for fever, activity change, appetite change, fatigue and unexpected weight change.  HENT: Negative.   Eyes: Negative.   Respiratory: Negative for cough, chest tightness, shortness of breath and wheezing.   Cardiovascular: Negative for chest pain, palpitations and leg swelling.  Gastrointestinal: Negative for nausea, abdominal pain, diarrhea,  constipation and abdominal distention.  Musculoskeletal: Positive for gait problem. Negative for myalgias, back pain and arthralgias.  Skin: Negative.   Neurological: Positive for dizziness. Negative for tremors, syncope, weakness, light-headedness and headaches.  Psychiatric/Behavioral: Negative.       Objective:   Physical Exam  Constitutional: He is oriented to person, place, and time. He appears well-developed and well-nourished.  HENT:  Head: Normocephalic and atraumatic.  Eyes: EOM are normal.  Neck: Normal range of motion. No JVD present. No thyromegaly present.  Cardiovascular: Normal rate and regular rhythm.   Pulmonary/Chest: Effort normal and breath sounds normal. No respiratory distress. He has no wheezes. He has no rales.  Abdominal: Soft. Bowel sounds are normal. He exhibits no distension. There is no tenderness. There is no rebound.  Musculoskeletal: He exhibits no edema.  Neurological: He is alert and oriented to person, place, and time.  Skin: Skin is warm and dry.  Psychiatric: He has a normal mood and affect.   Filed Vitals:   05/01/15 1034  BP: 142/80  Pulse: 91  Temp: 98 F (36.7 C)  TempSrc: Oral  Resp: 12  Weight: 152 lb 12.8 oz (69.31 kg)  SpO2: 98%      Assessment & Plan:

## 2015-05-01 NOTE — Assessment & Plan Note (Signed)
Aged out of colonoscopy. Immunizations up to date except shingles which he declines today. Counseled on decreasing his fall risk with home safety measures. Given 10 year screening recommendations.

## 2015-05-01 NOTE — Assessment & Plan Note (Signed)
Some numbness in his feet, no callusing or skin breakdown. Taking metformin and based on recent labs will decrease to once daily metformin. Not on ACE-I or ARB and recent BMP with normal kidney function.

## 2015-05-10 ENCOUNTER — Telehealth: Payer: Self-pay | Admitting: Internal Medicine

## 2015-05-10 NOTE — Telephone Encounter (Signed)
[facility] [phone]  Verbal for a social worker to come out to help with community resources

## 2015-05-10 NOTE — Telephone Encounter (Signed)
Called and spoke with Tiffany. Gave verbal orders for social work and she also wanted orders for PT.

## 2015-05-11 ENCOUNTER — Telehealth: Payer: Self-pay | Admitting: Internal Medicine

## 2015-05-11 NOTE — Telephone Encounter (Signed)
Got referral for Home Therapy.  Checked his BP: Seated 132/78 Standing 96/60 Requesting order to do a vestibular evaluation.

## 2015-05-11 NOTE — Telephone Encounter (Signed)
Fine

## 2015-05-11 NOTE — Telephone Encounter (Signed)
Left message on [name] [name]'s voice mail giving verbal ok for vestibular evaluation.

## 2015-07-19 ENCOUNTER — Other Ambulatory Visit (INDEPENDENT_AMBULATORY_CARE_PROVIDER_SITE_OTHER): Payer: Medicare Other

## 2015-07-19 ENCOUNTER — Ambulatory Visit (INDEPENDENT_AMBULATORY_CARE_PROVIDER_SITE_OTHER): Payer: Medicare Other | Admitting: Nurse Practitioner

## 2015-07-19 ENCOUNTER — Encounter: Payer: Self-pay | Admitting: Nurse Practitioner

## 2015-07-19 ENCOUNTER — Ambulatory Visit (INDEPENDENT_AMBULATORY_CARE_PROVIDER_SITE_OTHER)
Admission: RE | Admit: 2015-07-19 | Discharge: 2015-07-19 | Disposition: A | Discharge status: Home / Self Care | Payer: Medicare Other | Source: Ambulatory Visit | Attending: Nurse Practitioner | Admitting: Nurse Practitioner

## 2015-07-19 VITALS — BP 128/70 | HR 84 | Temp 98.4°F | Ht 69.0 in | Wt 155.0 lb

## 2015-07-19 DIAGNOSIS — S40021A Contusion of right upper arm, initial encounter: Secondary | ICD-10-CM

## 2015-07-19 LAB — CBC WITH DIFFERENTIAL/PLATELET
BASOS PCT: 0.3 % (ref 0.0–3.0)
Basophils Absolute: 0 10*3/uL (ref 0.0–0.1)
EOS ABS: 0.1 10*3/uL (ref 0.0–0.7)
EOS PCT: 1.3 % (ref 0.0–5.0)
HCT: 34.3 % — ABNORMAL LOW (ref 39.0–52.0)
Hemoglobin: 11.8 g/dL — ABNORMAL LOW (ref 13.0–17.0)
LYMPHS ABS: 1.9 10*3/uL (ref 0.7–4.0)
Lymphocytes Relative: 41.8 % (ref 12.0–46.0)
MCHC: 34.3 g/dL (ref 30.0–36.0)
MCV: 86.8 fl (ref 78.0–100.0)
MONO ABS: 0.4 10*3/uL (ref 0.1–1.0)
Monocytes Relative: 9.5 % (ref 3.0–12.0)
NEUTROS ABS: 2.1 10*3/uL (ref 1.4–7.7)
NEUTROS PCT: 47.1 % (ref 43.0–77.0)
RBC: 3.95 Mil/uL — ABNORMAL LOW (ref 4.22–5.81)
RDW: 13.8 % (ref 11.5–15.5)
WBC: 4.5 10*3/uL (ref 4.0–10.5)

## 2015-07-19 LAB — PROTIME-INR
INR: 1.1 ratio — AB (ref 0.8–1.0)
Prothrombin Time: 11 s (ref 9.6–13.1)

## 2015-07-19 LAB — APTT: aPTT: 30.7 s (ref 23.4–32.7)

## 2015-07-19 NOTE — Progress Notes (Signed)
Pre visit review using our clinic review tool, if applicable. No additional management support is needed unless otherwise documented below in the visit note. 

## 2015-07-19 NOTE — Patient Instructions (Signed)
Hematoma  A hematoma is a collection of blood under the skin, in an organ, in a body space, in a joint space, or in other tissue. The blood can clot to form a lump that you can see and feel. The lump is often firm and may sometimes become sore and tender. Most hematomas get better in a few days to weeks. However, some hematomas may be serious and require medical care. Hematomas can range in size from very small to very large.  CAUSES   A hematoma can be caused by a blunt or penetrating injury. It can also be caused by spontaneous leakage from a blood vessel under the skin. Spontaneous leakage from a blood vessel is more likely to occur in older people, especially those taking blood thinners. Sometimes, a hematoma can develop after certain medical procedures.  SIGNS AND SYMPTOMS   · A firm lump on the body.  · Possible pain and tenderness in the area.  · Bruising. Blue, dark blue, purple-red, or yellowish skin may appear at the site of the hematoma if the hematoma is close to the surface of the skin.  For hematomas in deeper tissues or body spaces, the signs and symptoms may be subtle. For example, an intra-abdominal hematoma may cause abdominal pain, weakness, fainting, and shortness of breath. An intracranial hematoma may cause a headache or symptoms such as weakness, trouble speaking, or a change in consciousness.  DIAGNOSIS   A hematoma can usually be diagnosed based on your medical history and a physical exam. Imaging tests may be needed if your health care provider suspects a hematoma in deeper tissues or body spaces, such as the abdomen, head, or chest. These tests may include ultrasonography or a CT scan.   TREATMENT   Hematomas usually go away on their own over time. Rarely does the blood need to be drained out of the body. Large hematomas or those that may affect vital organs will sometimes need surgical drainage or monitoring.  HOME CARE INSTRUCTIONS   · Apply ice to the injured area:      Put ice in a  plastic bag.      Place a towel between your skin and the bag.      Leave the ice on for 20 minutes, 2-3 times a day for the first 1 to 2 days.    · After the first 2 days, switch to using warm compresses on the hematoma.    · Elevate the injured area to help decrease pain and swelling. Wrapping the area with an elastic bandage may also be helpful. Compression helps to reduce swelling and promotes shrinking of the hematoma. Make sure the bandage is not wrapped too tight.    · If your hematoma is on a lower extremity and is painful, crutches may be helpful for a couple days.    · Only take over-the-counter or prescription medicines as directed by your health care provider.  SEEK IMMEDIATE MEDICAL CARE IF:   · You have increasing pain, or your pain is not controlled with medicine.    · You have a fever.    · You have worsening swelling or discoloration.    · Your skin over the hematoma breaks or starts bleeding.    · Your hematoma is in your chest or abdomen and you have weakness, shortness of breath, or a change in consciousness.  · Your hematoma is on your scalp (caused by a fall or injury) and you have a worsening headache or a change in alertness or consciousness.  MAKE SURE YOU:   ·   Understand these instructions.  · Will watch your condition.  · Will get help right away if you are not doing well or get worse.     This information is not intended to replace advice given to you by your health care provider. Make sure you discuss any questions you have with your health care provider.     Document Released: 11/07/2003 Document Revised: 11/25/2012 Document Reviewed: 09/02/2012  Elsevier Interactive Patient Education ©2016 Else[employer] Inc[employer]

## 2015-07-19 NOTE — Progress Notes (Signed)
Patient ID: [name] [name], male    DOB: 17-Jul-1917  Age: 82 y.o. MRN: [id number]  CC: Bruising   HPI WRYDER KEIZER presents for CC of bruising of right upper arm only x 1-2 days.   1) Pt is accompanied by daughter. Unsure about history from patient. Daughter reports he is fairly reliable and tells her if he falls or injures himself.  Denies trauma, pain, pruritis, or treatment to date  History Wilhelm has a past medical history of Arthritis; Diabetes mellitus; Heart murmur; Hypertension; Hypothyroid; Prostate cancer ([place]) (1998 dx); MDS (myelodysplastic syndrome) ([place]) (12/2006 ); and NSTEMI (non-ST elevation myocardial infarction) ([place]) (06/2013).   He has past surgical history that includes Appendectomy and Total knee arthroplasty (2005).   His family history includes Prostate cancer (age of onset: 63) in his father.He reports that he has never smoked. He has never used smokeless tobacco. He reports that he does not drink alcohol or use illicit drugs.  Outpatient Prescriptions Prior to Visit  Medication Sig Dispense Refill  . aspirin EC 81 MG tablet Take 1 tablet (81 mg total) by mouth daily. 150 tablet 2  . levothyroxine (SYNTHROID, LEVOTHROID) 125 MCG tablet Take 1 tablet (125 mcg total) by mouth daily. 90 tablet 3  . metFORMIN (GLUCOPHAGE) 500 MG tablet Take 1 tablet (500 mg total) by mouth daily with breakfast. 90 tablet 3  . Multiple Vitamins-Minerals (MENS ONE DAILY PO) Take 1 tablet by mouth daily.     No facility-administered medications prior to visit.    ROS Review of Systems  Constitutional: Negative for fever, chills, diaphoresis and fatigue.  Eyes: Negative for visual disturbance.  Respiratory: Negative for chest tightness, shortness of breath and wheezing.   Cardiovascular: Negative for chest pain, palpitations and leg swelling.  Skin: Positive for color change.  Neurological: Negative for dizziness, weakness and numbness.  Psychiatric/Behavioral: The patient is  not nervous/anxious.     Objective:  BP 128/70 mmHg  Pulse 84  Temp(Src) 98.4 F (36.9 C) (Oral)  Ht 5\' 9"  (1.753 m)  Wt 155 lb (70.308 kg)  BMI 22.88 kg/m2  SpO2 95%  Physical Exam  Constitutional: He is oriented to person, place, and time. He appears well-developed and well-nourished. No distress.  HENT:  Head: Normocephalic and atraumatic.  Right Ear: External ear normal.  Left Ear: External ear normal.  Cardiovascular: Normal rate and regular rhythm.   Pulmonary/Chest: Effort normal and breath sounds normal. No respiratory distress. He has no wheezes. He has no rales. He exhibits no tenderness.  Neurological: He is alert and oriented to person, place, and time.  Skin: Skin is warm and dry. No rash noted. He is not diaphoretic. There is erythema.     Ecchymosis that wraps from back of arm to front, hematoma laterally, non-tender to light palpation, outer edge is darker and lightens in the center to a yellowish coloration. No sign of infection, penetrating wound, cyst, abscess ect... No bug bites apparent  Psychiatric: He has a normal mood and affect. His behavior is normal. Judgment and thought content normal.      Assessment & Plan:   Zacharia was seen today for bruising.  Diagnoses and all orders for this visit:  Arm bruise, right, initial encounter -     DG Humerus Right; Future -     INR/PT; Future -     PTT; Future -     CBC with Differential/Platelet; Future  I am having Mr. Quattrone maintain his levothyroxine, aspirin EC, Multiple  Vitamins-Minerals (MENS ONE DAILY PO), and metFORMIN.  No orders of the defined types were placed in this encounter.     Follow-up: Return if symptoms worsen or fail to improve.

## 2015-07-23 DIAGNOSIS — S40029A Contusion of unspecified upper arm, initial encounter: Secondary | ICD-10-CM | POA: Insufficient documentation

## 2015-07-23 NOTE — Assessment & Plan Note (Signed)
New onset Checking CBC w/ diff, PT/INR, PTT today due to h/o MDS He is not currently on ASA- he does not like to take it he reports Advised continued hold of ASA Ice, be gingerly with it and stressed fall precautions.

## 2015-07-25 ENCOUNTER — Telehealth: Payer: Self-pay | Admitting: Internal Medicine

## 2015-07-25 NOTE — Telephone Encounter (Signed)
Spoke with Daughter, Juliann Pulse.  See result note.

## 2015-07-25 NOTE — Telephone Encounter (Signed)
Pt daughter-Cathy called to get lab results. Please advise/msn

## 2015-07-27 ENCOUNTER — Telehealth: Payer: Self-pay | Admitting: Hematology and Oncology

## 2015-07-27 ENCOUNTER — Other Ambulatory Visit: Payer: Self-pay | Admitting: Hematology and Oncology

## 2015-07-27 NOTE — Telephone Encounter (Signed)
returned call and s.w. pt dtr and advised on 4.27 appt....pt ok and aware

## 2015-08-02 ENCOUNTER — Other Ambulatory Visit: Payer: Self-pay | Admitting: Hematology and Oncology

## 2015-08-02 DIAGNOSIS — D469 Myelodysplastic syndrome, unspecified: Secondary | ICD-10-CM

## 2015-08-03 ENCOUNTER — Encounter: Payer: Self-pay | Admitting: Hematology and Oncology

## 2015-08-03 ENCOUNTER — Ambulatory Visit (HOSPITAL_BASED_OUTPATIENT_CLINIC_OR_DEPARTMENT_OTHER): Payer: Medicare Other | Admitting: Hematology and Oncology

## 2015-08-03 ENCOUNTER — Other Ambulatory Visit: Payer: Self-pay | Admitting: Hematology and Oncology

## 2015-08-03 ENCOUNTER — Other Ambulatory Visit (HOSPITAL_BASED_OUTPATIENT_CLINIC_OR_DEPARTMENT_OTHER): Payer: Medicare Other

## 2015-08-03 VITALS — BP 155/66 | HR 80 | Temp 98.1°F | Resp 18 | Ht 69.0 in | Wt 152.1 lb

## 2015-08-03 DIAGNOSIS — D61818 Other pancytopenia: Secondary | ICD-10-CM | POA: Diagnosis present

## 2015-08-03 DIAGNOSIS — R42 Dizziness and giddiness: Secondary | ICD-10-CM

## 2015-08-03 DIAGNOSIS — D469 Myelodysplastic syndrome, unspecified: Secondary | ICD-10-CM

## 2015-08-03 LAB — CBC WITH DIFFERENTIAL/PLATELET
BASO%: 0.6 % (ref 0.0–2.0)
BASOS ABS: 0 10*3/uL (ref 0.0–0.1)
EOS ABS: 0.1 10*3/uL (ref 0.0–0.5)
EOS%: 2 % (ref 0.0–7.0)
HCT: 35.2 % — ABNORMAL LOW (ref 38.4–49.9)
HEMOGLOBIN: 12 g/dL — AB (ref 13.0–17.1)
LYMPH#: 1.4 10*3/uL (ref 0.9–3.3)
LYMPH%: 39.6 % (ref 14.0–49.0)
MCH: 29.4 pg (ref 27.2–33.4)
MCHC: 34.1 g/dL (ref 32.0–36.0)
MCV: 86.3 fL (ref 79.3–98.0)
MONO#: 0.3 10*3/uL (ref 0.1–0.9)
MONO%: 8.3 % (ref 0.0–14.0)
NEUT#: 1.7 10*3/uL (ref 1.5–6.5)
NEUT%: 49.5 % (ref 39.0–75.0)
NRBC: 0 % (ref 0–0)
RBC: 4.08 10*6/uL — ABNORMAL LOW (ref 4.20–5.82)
RDW: 13.1 % (ref 11.0–14.6)
WBC: 3.5 10*3/uL — ABNORMAL LOW (ref 4.0–10.3)

## 2015-08-03 NOTE — Assessment & Plan Note (Signed)
He had intermittent dizziness of unknown etiology. This is chronic in nature. I recommend close follow-up with primary care doctor and consider referral back to cardiologist for further evaluation for possible intermittent arrhythmia if indicated.

## 2015-08-03 NOTE — Assessment & Plan Note (Signed)
The patient did not recall seeing me 2 and a half years ago. He was diagnosed with possible low-grade myelodysplastic syndrome several years ago and was placed on observation. He had intermittent pancytopenia over the last few years but his blood count fluctuated. His CBC today is within normal limits for his age. His recent acute hematoma with thrombocytopenia happened spontaneously.  I suspect he may have transient thrombocytopenia related to bone marrow suppression from recent infection. Since then, his platelet count has rebounded. I would not recommend further evaluation. I recommend high-dose vitamin B-12 supplement by mouth at 1000 g daily indefinitely. He does not need future follow-up unless his platelet count dropped to less than 100,000 again.

## 2015-08-03 NOTE — Progress Notes (Signed)
[facility] OFFICE PROGRESS NOTE  Patient Care Team: Hoyt Koch, MD as PCP - General (Internal Medicine) Festus Aloe, MD (Urology) Heath Lark, MD (Hematology and Oncology) Jerline Pain, MD (Cardiology)  SUMMARY OF ONCOLOGIC HISTORY:  DIAGNOSIS: Leukopenia, anemia, history of prostate cancer, possible low-grade myelodysplastic syndrome  SUMMARY OF ONCOLOGIC HISTORY: The patient was last seen here almost 3 years ago. In 2008, he had bone marrow aspirate and biopsy which revealed hypercellular bone marrow, concern for possible low-grade myelodysplastic syndrome. He was placed on observation. There was also a mention of possible diagnosis of prostate cancer although the patient cannot remember the details. He follows with the urologist.  INTERVAL HISTORY: Please see below for problem oriented charting. He is referred back here because of recent acute on chronic pancytopenia. According to the patient and family members, he woke up one day with significant hematoma in his upper arm. He denies trauma. He was admitted to the hospital recently 3 months ago for pneumonia. I reviewed his CBC over the past few months, his white blood cell count fluctuated from 3.1 to 4.5. Platelet count was as low as 82,000, improved back to normal today. Hemoglobin was as low as 10.8, improved to 12 today Apart from recent hematoma, The patient denies any recent signs or symptoms of bleeding such as spontaneous epistaxis, hematuria or hematochezia. He also complained of occasional dizziness.  REVIEW OF SYSTEMS:   Constitutional: Denies fevers, chills or abnormal weight loss Eyes: Denies blurriness of vision Ears, nose, mouth, throat, and face: Denies mucositis or sore throat Respiratory: Denies cough, dyspnea or wheezes Cardiovascular: Denies palpitation, chest discomfort or lower extremity swelling Gastrointestinal:  Denies nausea, heartburn or change in bowel habits Skin:  Denies abnormal skin rashes Lymphatics: Denies new lymphadenopathy  Neurological:Denies numbness, tingling or new weaknesses Behavioral/Psych: Mood is stable, no new changes  All other systems were reviewed with the patient and are negative.  I have reviewed the past medical history, past surgical history, social history and family history with the patient and they are unchanged from previous note.  ALLERGIES:  is allergic to statins.  MEDICATIONS:  Current Outpatient Prescriptions  Medication Sig Dispense Refill  . aspirin EC 81 MG tablet Take 1 tablet (81 mg total) by mouth daily. 150 tablet 2  . levothyroxine (SYNTHROID, LEVOTHROID) 125 MCG tablet Take 1 tablet (125 mcg total) by mouth daily. 90 tablet 3  . metFORMIN (GLUCOPHAGE) 500 MG tablet Take 1 tablet (500 mg total) by mouth daily with breakfast. 90 tablet 3  . Multiple Vitamins-Minerals (MENS ONE DAILY PO) Take 1 tablet by mouth daily.     No current facility-administered medications for this visit.    PHYSICAL EXAMINATION: ECOG PERFORMANCE STATUS: 1 - Symptomatic but completely ambulatory  Filed Vitals:   08/03/15 1119  BP: 155/66  Pulse: 80  Temp: 98.1 F (36.7 C)  Resp: 18   Filed Weights   08/03/15 1119  Weight: 152 lb 1.6 oz (68.992 kg)    GENERAL:alert, no distress and comfortable SKIN: skin color, texture, turgor are normal, no rashes or significant lesions EYES: normal, Conjunctiva are pink and non-injected, sclera clear OROPHARYNX:no exudate, no erythema and lips, buccal mucosa, and tongue normal  NECK: supple, thyroid normal size, non-tender, without nodularity LYMPH:  no palpable lymphadenopathy in the cervical, axillary or inguinal LUNGS: clear to auscultation and percussion with normal breathing effort HEART: regular rate & rhythm and no murmurs and no lower extremity edema ABDOMEN:abdomen soft, non-tender and normal  bowel sounds Musculoskeletal:no cyanosis of digits and no clubbing  NEURO: alert  & oriented x 3 with fluent speech, no focal motor/sensory deficits  LABORATORY DATA:  I have reviewed the data as listed    Component Value Date/Time   NA 141 04/12/2015 0518   NA 141 03/31/2013 0912   NA 138 04/10/2011   K 4.1 04/12/2015 0518   K 4.1 03/31/2013 0912   CL 109 04/12/2015 0518   CO2 26 04/12/2015 0518   CO2 23 03/31/2013 0912   GLUCOSE 119* 04/12/2015 0518   GLUCOSE 161* 03/31/2013 0912   BUN 15 04/12/2015 0518   BUN 19.5 03/31/2013 0912   BUN 20 04/10/2011   CREATININE 1.04 04/12/2015 0518   CREATININE 1.0 03/31/2013 0912   CREATININE 1.0 04/10/2011   CALCIUM 8.4* 04/12/2015 0518   CALCIUM 9.3 03/31/2013 0912   PROT 5.8* 04/11/2015 0423   PROT 7.1 03/31/2013 0912   ALBUMIN 3.4* 04/11/2015 0423   ALBUMIN 3.9 03/31/2013 0912   AST 15 04/11/2015 0423   AST 14 03/31/2013 0912   ALT 12* 04/11/2015 0423   ALT 6 03/31/2013 0912   ALKPHOS 73 04/11/2015 0423   ALKPHOS 77 03/31/2013 0912   BILITOT 0.8 04/11/2015 0423   BILITOT 0.56 03/31/2013 0912   GFRNONAA 59* 04/12/2015 0518   GFRAA >60 04/12/2015 0518    No results found for: SPEP, UPEP  Lab Results  Component Value Date   WBC 3.5* 08/03/2015   NEUTROABS 1.7 08/03/2015   HGB 12.0* 08/03/2015   HCT 35.2* 08/03/2015   MCV 86.3 08/03/2015   PLT  08/03/2015    144 Large plts present, Platelet count by citrate method      Chemistry      Component Value Date/Time   NA 141 04/12/2015 0518   NA 141 03/31/2013 0912   NA 138 04/10/2011   K 4.1 04/12/2015 0518   K 4.1 03/31/2013 0912   CL 109 04/12/2015 0518   CO2 26 04/12/2015 0518   CO2 23 03/31/2013 0912   BUN 15 04/12/2015 0518   BUN 19.5 03/31/2013 0912   BUN 20 04/10/2011   CREATININE 1.04 04/12/2015 0518   CREATININE 1.0 03/31/2013 0912   CREATININE 1.0 04/10/2011   GLU 116 04/10/2011      Component Value Date/Time   CALCIUM 8.4* 04/12/2015 0518   CALCIUM 9.3 03/31/2013 0912   ALKPHOS 73 04/11/2015 0423   ALKPHOS 77 03/31/2013 0912    AST 15 04/11/2015 0423   AST 14 03/31/2013 0912   ALT 12* 04/11/2015 0423   ALT 6 03/31/2013 0912   BILITOT 0.8 04/11/2015 0423   BILITOT 0.56 03/31/2013 0912     ASSESSMENT & PLAN:  Pancytopenia, acquired ([place]) The patient did not recall seeing me 2 and a half years ago. He was diagnosed with possible low-grade myelodysplastic syndrome several years ago and was placed on observation. He had intermittent pancytopenia over the last few years but his blood count fluctuated. His CBC today is within normal limits for his age. His recent acute hematoma with thrombocytopenia happened spontaneously.  I suspect he may have transient thrombocytopenia related to bone marrow suppression from recent infection. Since then, his platelet count has rebounded. I would not recommend further evaluation. I recommend high-dose vitamin B-12 supplement by mouth at 1000 g daily indefinitely. He does not need future follow-up unless his platelet count dropped to less than 100,000 again.  Dizziness He had intermittent dizziness of unknown etiology. This is chronic in  nature. I recommend close follow-up with primary care doctor and consider referral back to cardiologist for further evaluation for possible intermittent arrhythmia if indicated.   No orders of the defined types were placed in this encounter.   All questions were answered. The patient knows to call the clinic with any problems, questions or concerns. No barriers to learning was detected. I spent 15 minutes counseling the patient face to face. The total time spent in the appointment was 20 minutes and more than 50% was on counseling and review of test results     [facility], [place], MD 08/03/2015 3:10 PM

## 2016-04-21 ENCOUNTER — Emergency Department (HOSPITAL_COMMUNITY): Payer: Medicare Other

## 2016-04-21 ENCOUNTER — Encounter (HOSPITAL_COMMUNITY): Payer: Self-pay | Admitting: Emergency Medicine

## 2016-04-21 ENCOUNTER — Emergency Department (HOSPITAL_COMMUNITY)
Admission: EM | Admit: 2016-04-21 | Discharge: 2016-04-21 | Disposition: A | Discharge status: Home / Self Care | Payer: Medicare Other | Attending: Emergency Medicine | Admitting: Emergency Medicine

## 2016-04-21 DIAGNOSIS — E1149 Type 2 diabetes mellitus with other diabetic neurological complication: Secondary | ICD-10-CM | POA: Insufficient documentation

## 2016-04-21 DIAGNOSIS — Z8546 Personal history of malignant neoplasm of prostate: Secondary | ICD-10-CM | POA: Diagnosis not present

## 2016-04-21 DIAGNOSIS — Y92009 Unspecified place in unspecified non-institutional (private) residence as the place of occurrence of the external cause: Secondary | ICD-10-CM | POA: Insufficient documentation

## 2016-04-21 DIAGNOSIS — Z7982 Long term (current) use of aspirin: Secondary | ICD-10-CM | POA: Diagnosis not present

## 2016-04-21 DIAGNOSIS — Y999 Unspecified external cause status: Secondary | ICD-10-CM | POA: Diagnosis not present

## 2016-04-21 DIAGNOSIS — Y9301 Activity, walking, marching and hiking: Secondary | ICD-10-CM | POA: Diagnosis not present

## 2016-04-21 DIAGNOSIS — W1830XA Fall on same level, unspecified, initial encounter: Secondary | ICD-10-CM | POA: Diagnosis not present

## 2016-04-21 DIAGNOSIS — I10 Essential (primary) hypertension: Secondary | ICD-10-CM | POA: Diagnosis not present

## 2016-04-21 DIAGNOSIS — Z7984 Long term (current) use of oral hypoglycemic drugs: Secondary | ICD-10-CM | POA: Insufficient documentation

## 2016-04-21 DIAGNOSIS — R531 Weakness: Secondary | ICD-10-CM | POA: Diagnosis not present

## 2016-04-21 DIAGNOSIS — I252 Old myocardial infarction: Secondary | ICD-10-CM | POA: Insufficient documentation

## 2016-04-21 DIAGNOSIS — Z96652 Presence of left artificial knee joint: Secondary | ICD-10-CM | POA: Insufficient documentation

## 2016-04-21 DIAGNOSIS — E039 Hypothyroidism, unspecified: Secondary | ICD-10-CM | POA: Diagnosis not present

## 2016-04-21 DIAGNOSIS — W19XXXA Unspecified fall, initial encounter: Secondary | ICD-10-CM

## 2016-04-21 LAB — URINALYSIS, ROUTINE W REFLEX MICROSCOPIC
BACTERIA UA: NONE SEEN
BILIRUBIN URINE: NEGATIVE
Glucose, UA: >=500 mg/dL — AB
KETONES UR: NEGATIVE mg/dL
Leukocytes, UA: NEGATIVE
Nitrite: NEGATIVE
PH: 5 (ref 5.0–8.0)
PROTEIN: 30 mg/dL — AB
Specific Gravity, Urine: 1.021 (ref 1.005–1.030)

## 2016-04-21 LAB — COMPREHENSIVE METABOLIC PANEL
ALBUMIN: 3.8 g/dL (ref 3.5–5.0)
ALT: 16 U/L — AB (ref 17–63)
AST: 28 U/L (ref 15–41)
Alkaline Phosphatase: 82 U/L (ref 38–126)
Anion gap: 11 (ref 5–15)
BUN: 21 mg/dL — AB (ref 6–20)
CHLORIDE: 100 mmol/L — AB (ref 101–111)
CO2: 24 mmol/L (ref 22–32)
Calcium: 9.3 mg/dL (ref 8.9–10.3)
Creatinine, Ser: 1.29 mg/dL — ABNORMAL HIGH (ref 0.61–1.24)
GFR calc Af Amer: 52 mL/min — ABNORMAL LOW (ref >60)
GFR, EST NON AFRICAN AMERICAN: 45 mL/min — AB (ref >60)
GLUCOSE: 158 mg/dL — AB (ref 65–99)
Potassium: 4 mmol/L (ref 3.5–5.1)
Sodium: 135 mmol/L (ref 135–145)
Total Bilirubin: 0.9 mg/dL (ref 0.3–1.2)
Total Protein: 6.9 g/dL (ref 6.5–8.1)

## 2016-04-21 LAB — CBC WITH DIFFERENTIAL/PLATELET
BASOS ABS: 0 10*3/uL (ref 0.0–0.1)
BASOS PCT: 0 %
Eosinophils Absolute: 0 10*3/uL (ref 0.0–0.7)
Eosinophils Relative: 0 %
HCT: 35.6 % — ABNORMAL LOW (ref 39.0–52.0)
Hemoglobin: 12.2 g/dL — ABNORMAL LOW (ref 13.0–17.0)
LYMPHS PCT: 11 %
Lymphs Abs: 0.8 10*3/uL (ref 0.7–4.0)
MCH: 29.1 pg (ref 26.0–34.0)
MCHC: 34.3 g/dL (ref 30.0–36.0)
MCV: 85 fL (ref 78.0–100.0)
MONO ABS: 0.7 10*3/uL (ref 0.1–1.0)
Monocytes Relative: 10 %
Neutro Abs: 5.5 10*3/uL (ref 1.7–7.7)
Neutrophils Relative %: 79 %
PLATELETS: 142 10*3/uL — AB (ref 150–400)
RBC: 4.19 MIL/uL — AB (ref 4.22–5.81)
RDW: 13.8 % (ref 11.5–15.5)
WBC: 7.1 10*3/uL (ref 4.0–10.5)

## 2016-04-21 LAB — CK: CK TOTAL: 511 U/L — AB (ref 49–397)

## 2016-04-21 MED ORDER — SODIUM CHLORIDE 0.9 % IV BOLUS (SEPSIS)
500.0000 mL | Freq: Once | INTRAVENOUS | Status: AC
  Administered 2016-04-21: 500 mL via INTRAVENOUS

## 2016-04-21 MED ORDER — SODIUM CHLORIDE 0.9 % IV SOLN
Freq: Once | INTRAVENOUS | Status: AC
  Administered 2016-04-21: 08:00:00 via INTRAVENOUS

## 2016-04-21 NOTE — ED Triage Notes (Signed)
Pt on the floor since 730pm last night.

## 2016-04-21 NOTE — Discharge Instructions (Signed)
As discussed it is very important that you stay well hydrated, eat sufficient food. We are arranging for physical therapy and nursing care to come to your house to perform an assessment for services.  Return here for concerning changes in your condition.

## 2016-04-21 NOTE — ED Triage Notes (Signed)
Per [facility], pt from home, lives alone, was walking with a walker and his legs gave out. Pt lowered himself to the floor, no injury, no fall. Pt denies weakness at this time. Pt in NAD. Denies pain.

## 2016-04-21 NOTE — ED Provider Notes (Signed)
[facility] DEPT Provider Note   CSN: [id number] Arrival date & time: 04/21/16  0731     History   Chief Complaint Chief Complaint  Patient presents with  . Weakness    HPI [name] [name] is a 83 y.o. male.  HPI  Patient presents approximately 12 hours after a fall. Patient states that he is generally well, had been in his usual state of health until yesterday, about 12 hours ago. At that time, the patient was using his walker, when he felt suddenly weak, fell onto his backside. Patient spent the subsequent 12 hours on the ground until EMS arrived. He denies trauma, loss of consciousness, has ongoing weakness in his lower extremities, but no abdominal pain, no chest pain. No medication taken since the fall. Patient denies recent medication changes, diet changes, activity changes.  EMS reports that en route the patient was hemodynamically stable, continuous cardiac monitoring showed occasional PVC, otherwise unremarkable.  Past Medical History:  Diagnosis Date  . Arthritis   . Diabetes mellitus   . Heart murmur   . Hypertension   . Hypothyroid   . MDS (myelodysplastic syndrome) ([place]) 12/2006    hyperplastic on bone marrow bx 2008  . NSTEMI (non-ST elevation myocardial infarction) ([place]) 06/2013   demand ischemia in setting of sepsis  . Prostate cancer [facility]) 1998 dx    Patient Active Problem List   Diagnosis Date Noted  . Pancytopenia, acquired ([place]) 08/03/2015  . Arm bruise 07/23/2015  . Routine general medical examination at a health care facility 05/01/2015  . CAP (community acquired pneumonia) 04/10/2015  . NSTEMI (non-ST elevated myocardial infarction) ([place]) 06/06/2013  . Chronic constipation   . Dizziness 01/14/2012  . Ataxia 01/14/2012  . B12 deficiency 01/14/2012  . Anxiety and depression 12/17/2011  . Hypertension   . Hypothyroid   . Prostate cancer ([place])   . Osteoarthritis   . 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 MDS (myelodysplastic syndrome) ([place]) 12/08/2006    Past Surgical History:  Procedure Laterality Date  . APPENDECTOMY    . TOTAL KNEE ARTHROPLASTY  2005   left       Home Medications    Prior to Admission medications   Medication Sig Start Date End Date Taking? Authorizing Provider  aspirin EC 81 MG tablet Take 1 tablet (81 mg total) by mouth daily. 10/31/14   Rowe Clack, MD  levothyroxine (SYNTHROID, LEVOTHROID) 125 MCG tablet Take 1 tablet (125 mcg total) by mouth daily. 10/31/14   Rowe Clack, MD  metFORMIN (GLUCOPHAGE) 500 MG tablet Take 1 tablet (500 mg total) by mouth daily with breakfast. 05/01/15   Hoyt Koch, MD  Multiple Vitamins-Minerals (MENS ONE DAILY PO) Take 1 tablet by mouth daily.    Historical Provider, MD    Family History Family History  Problem Relation Age of Onset  . Prostate cancer Father 32    Social History Social History  Substance Use Topics  . Smoking status: Never Smoker  . Smokeless tobacco: Never Used  . Alcohol use No     Allergies   Statins   Review of Systems Review of Systems  Constitutional:       Per HPI, otherwise negative  HENT:       Per HPI, otherwise negative  Respiratory:       Per HPI, otherwise negative  Cardiovascular:       Per HPI, otherwise negative  Gastrointestinal: Negative for vomiting.  Endocrine:  Negative aside from HPI  Genitourinary:       Neg aside from HPI   Musculoskeletal:       Per HPI, otherwise negative  Skin: Negative.   Neurological: Negative for syncope.  Hematological:       Per chart report, pancytopenic     Physical Exam Updated Vital Signs There were no vitals taken for this visit.  Physical Exam  Constitutional: He is oriented to person, place, and time. He appears well-developed. No distress.  HENT:  Head: Normocephalic and atraumatic.  Eyes: Conjunctivae and EOM are normal.  Cardiovascular: Normal rate and regular rhythm.    Pulmonary/Chest: Effort normal. No stridor. No respiratory distress.  Abdominal: He exhibits no distension. There is no tenderness.  No appreciable mass  Musculoskeletal: He exhibits no edema.  No appreciable deformity, the patient has difficulty moving both legs, has 4/5 strength in each lower extremities throughout, but can flex, extend, both hips, knees, ankles spontaneously.  Neurological: He is alert and oriented to person, place, and time.  Skin: Skin is warm and dry.  Psychiatric: He has a normal mood and affect.  Nursing note and vitals reviewed.    ED Treatments / Results  Labs (all labs ordered are listed, but only abnormal results are displayed) Labs Reviewed  CBC WITH DIFFERENTIAL/PLATELET - Abnormal; Notable for the following:       Result Value   RBC 4.19 (*)    Hemoglobin 12.2 (*)    HCT 35.6 (*)    Platelets 142 (*)    All other components within normal limits  COMPREHENSIVE METABOLIC PANEL - Abnormal; Notable for the following:    Chloride 100 (*)    Glucose, Bld 158 (*)    BUN 21 (*)    Creatinine, Ser 1.29 (*)    ALT 16 (*)    GFR calc non Af Amer 45 (*)    GFR calc Af Amer 52 (*)    All other components within normal limits  CK - Abnormal; Notable for the following:    Total CK 511 (*)    All other components within normal limits  URINALYSIS, ROUTINE W REFLEX MICROSCOPIC - Abnormal; Notable for the following:    APPearance HAZY (*)    Glucose, UA >=500 (*)    Hgb urine dipstick SMALL (*)    Protein, ur 30 (*)    Squamous Epithelial / LPF 0-5 (*)    All other components within normal limits    EMS rhythm strip with rate 80, sinus, PVC, PAC, first-degree block, artifact, abnormal  EKG  EKG Interpretation  Date/Time:  Sunday April 21 2016 07:48:00 EST Ventricular Rate:  69 PR Interval:    QRS Duration: 164 QT Interval:  439 QTC Calculation: 471 R Axis:   -49 Text Interpretation:  Sinus rhythm Prolonged PR interval RBBB and LAFB No  significant change since last tracing Abnormal ekg Confirmed by Carmin Muskrat  MD [phone]) on 04/21/2016 11:20:11 AM       Radiology Ct Lumbar Spine Wo Contrast  Result Date: 04/21/2016 CLINICAL DATA:  Bilateral lower extremity weakness. EXAM: CT LUMBAR SPINE WITHOUT CONTRAST TECHNIQUE: Multidetector CT imaging of the lumbar spine was performed without intravenous contrast administration. Multiplanar CT image reconstructions were also generated. COMPARISON:  Lumbar spine MR dated 09/12/2007. FINDINGS: Segmentation: 5 non-rib-bearing lumbar vertebrae. The last open disc space is again labeled the L5-S1 level. Alignment: Mild retrolisthesis at the L1-2 and L3-4 levels without significant change. Vertebrae: Schmorl's nodes at multiple levels.  No fractures. No pars defects. Paraspinal and other soft tissues: Pericolonic soft tissue stranding in the region of the rectosigmoid junction. Disc levels: T11-12: Mild anterior spur formation, minimal vacuum phenomenon and mild Schmorl's node formation. T12-L1: Diffuse disc bulging, small Schmorl's nodes and bilateral facet hypertrophy. These changes are producing minimal canal stenosis without significant foraminal stenosis. L1-2: Mild anterior and minimal posterior spur formation and minimal vacuum phenomena. Minimal Schmorl's node formation. Mild facet hypertrophy on the right. Posterolateral disc bulging into the foramen, causing mild to moderate foraminal stenosis. No significant canal or left foraminal stenosis. L2-3: Mild diffuse disc bulging and mild anterior and minimal posterior spur formation with a minimal vacuum phenomenon and mild Schmorl's node formation. Minimal bilateral facet hypertrophy. Moderate to marked ligamentum flavum hypertrophy on the left and mild to moderate ligamentum flavum hypertrophy on the right. Mild bilateral foraminal stenosis and minimal canal stenosis. L3-4: Large left lateral spurs. Moderate anterior disc bulging. Mild anterior  posterior spur formation. Marked disc space narrowing with a vacuum phenomenon. Mild Schmorl's node formation. Mild bilateral facet hypertrophy. Moderate bilateral foraminal stenosis and moderate canal stenosis. L4-5: Moderate anterior and mild posterior disc bulging, minimal anterior and posterior spur formation, vacuum phenomenon and Schmorl's node formation. Diffuse posterolateral disc bulging on the left. There is also bilateral facet hypertrophy and bilateral ligamentum flavum hypertrophy. These changes are producing moderate-to-marked canal stenosis and moderate-to-marked bilateral foraminal stenosis. L5-S1: Large right lateral spurs. Mild diffuse posterior disc bulging and minimal posterior spur formation. Mild bilateral facet hypertrophy and mild to moderate bilateral ligamentum flavum hypertrophy. These changes are producing moderate canal stenosis and moderate bilateral foraminal stenosis. No focal disc protrusions are seen. IMPRESSION: 1. Pericolonic soft tissue stranding in the region of the rectosigmoid junction. This can be seen with diverticulitis or proctitis. If the patient has appropriate symptoms, further evaluation with an abdomen and pelvis CT with contrast would be recommended. 2. Multilevel degenerative changes, as described above. These include multifactorial moderate canal stenosis and moderate bilateral foraminal stenosis at the L3-4 level, moderate-to-marked canal stenosis and moderate-to-marked bilateral foraminal stenosis at the L4-5 level and moderate canal stenosis and moderate bilateral foraminal stenosis at the L5-S1 level. Electronically Signed   By: Claudie Revering M.D.   On: 04/21/2016 09:45    Procedures Procedures (including critical care time)  Medications Ordered in ED Medications  0.9 %  sodium chloride infusion ( Intravenous Stopped 04/21/16 1130)  sodium chloride 0.9 % bolus 500 mL (0 mLs Intravenous Stopped 04/21/16 1230)     Initial Impression / Assessment and  Plan / ED Course  I have reviewed the triage vital signs and the nursing notes.  Pertinent labs & imaging results that were available during my care of the patient were reviewed by me and considered in my medical decision making (see chart for details).  Clinical Course   On repeat exam if labs returned the patient is in no distress.  Update: Multiple family members not present. He states that patient typically has episodes of weakness, it is not uncommon to fall, but the patient's increased difficulty with returning to upright positioning is unusual. Daughter also states the patient has not eaten well over the past days. With family members had a lengthy conversation about all findings including some concern for mild dehydration, spinal stenosis.  Patient states that he has previously had home health, physical therapy, but after he stopped, he has had increasing weakness. I will arrange for outpatient physical therapy services to resume, home nurse  assessment, and absent acute findings today, patient will be discharged in stable condition. Patient and family members all agreeable, amenable to the plan.  Patient has received IV fluid resuscitation given his slight elevation in CK, this is likely secondary to time on the floor.    Final Clinical Impressions(s) / ED Diagnoses  Fall Elevated CK    Carmin Muskrat, MD 04/21/16 [phone]

## 2016-04-21 NOTE — ED Notes (Signed)
Pt returned from CT. Pt on monitors 

## 2016-07-19 ENCOUNTER — Emergency Department (HOSPITAL_COMMUNITY)
Admission: EM | Admit: 2016-07-19 | Discharge: 2016-07-19 | Disposition: A | Discharge status: Home / Self Care | Payer: Medicare Other | Attending: Emergency Medicine | Admitting: Emergency Medicine

## 2016-07-19 ENCOUNTER — Emergency Department (HOSPITAL_COMMUNITY): Payer: Medicare Other

## 2016-07-19 DIAGNOSIS — Z96652 Presence of left artificial knee joint: Secondary | ICD-10-CM | POA: Insufficient documentation

## 2016-07-19 DIAGNOSIS — E860 Dehydration: Secondary | ICD-10-CM | POA: Insufficient documentation

## 2016-07-19 DIAGNOSIS — E039 Hypothyroidism, unspecified: Secondary | ICD-10-CM | POA: Insufficient documentation

## 2016-07-19 DIAGNOSIS — E119 Type 2 diabetes mellitus without complications: Secondary | ICD-10-CM | POA: Insufficient documentation

## 2016-07-19 DIAGNOSIS — Z8546 Personal history of malignant neoplasm of prostate: Secondary | ICD-10-CM | POA: Insufficient documentation

## 2016-07-19 DIAGNOSIS — Z79899 Other long term (current) drug therapy: Secondary | ICD-10-CM | POA: Diagnosis not present

## 2016-07-19 DIAGNOSIS — R531 Weakness: Secondary | ICD-10-CM

## 2016-07-19 DIAGNOSIS — I10 Essential (primary) hypertension: Secondary | ICD-10-CM | POA: Insufficient documentation

## 2016-07-19 DIAGNOSIS — I252 Old myocardial infarction: Secondary | ICD-10-CM | POA: Insufficient documentation

## 2016-07-19 LAB — I-STAT CHEM 8, ED
BUN: 24 mg/dL — AB (ref 6–20)
CALCIUM ION: 1.13 mmol/L — AB (ref 1.15–1.40)
CHLORIDE: 103 mmol/L (ref 101–111)
CREATININE: 1.4 mg/dL — AB (ref 0.61–1.24)
GLUCOSE: 160 mg/dL — AB (ref 65–99)
HEMATOCRIT: 33 % — AB (ref 39.0–52.0)
Hemoglobin: 11.2 g/dL — ABNORMAL LOW (ref 13.0–17.0)
Potassium: 3.6 mmol/L (ref 3.5–5.1)
Sodium: 140 mmol/L (ref 135–145)
TCO2: 25 mmol/L (ref 0–100)

## 2016-07-19 LAB — CBC WITH DIFFERENTIAL/PLATELET
Basophils Absolute: 0 10*3/uL (ref 0.0–0.1)
Basophils Relative: 0 %
EOS ABS: 0 10*3/uL (ref 0.0–0.7)
EOS PCT: 1 %
HCT: 33.2 % — ABNORMAL LOW (ref 39.0–52.0)
HEMOGLOBIN: 11.2 g/dL — AB (ref 13.0–17.0)
LYMPHS ABS: 1.3 10*3/uL (ref 0.7–4.0)
Lymphocytes Relative: 41 %
MCH: 28.1 pg (ref 26.0–34.0)
MCHC: 33.7 g/dL (ref 30.0–36.0)
MCV: 83.2 fL (ref 78.0–100.0)
Monocytes Absolute: 0.3 10*3/uL (ref 0.1–1.0)
Monocytes Relative: 8 %
Neutro Abs: 1.6 10*3/uL — ABNORMAL LOW (ref 1.7–7.7)
Neutrophils Relative %: 50 %
PLATELETS: 237 10*3/uL (ref 150–400)
RBC: 3.99 MIL/uL — ABNORMAL LOW (ref 4.22–5.81)
RDW: 13.6 % (ref 11.5–15.5)
WBC: 3.2 10*3/uL — ABNORMAL LOW (ref 4.0–10.5)

## 2016-07-19 LAB — COMPREHENSIVE METABOLIC PANEL
ALK PHOS: 82 U/L (ref 38–126)
ALT: 13 U/L — ABNORMAL LOW (ref 17–63)
ANION GAP: 8 (ref 5–15)
AST: 21 U/L (ref 15–41)
Albumin: 3.9 g/dL (ref 3.5–5.0)
BUN: 24 mg/dL — ABNORMAL HIGH (ref 6–20)
CALCIUM: 9.1 mg/dL (ref 8.9–10.3)
CO2: 25 mmol/L (ref 22–32)
Chloride: 106 mmol/L (ref 101–111)
Creatinine, Ser: 1.37 mg/dL — ABNORMAL HIGH (ref 0.61–1.24)
GFR calc non Af Amer: 42 mL/min — ABNORMAL LOW (ref >60)
GFR, EST AFRICAN AMERICAN: 48 mL/min — AB (ref >60)
Glucose, Bld: 162 mg/dL — ABNORMAL HIGH (ref 65–99)
Potassium: 3.6 mmol/L (ref 3.5–5.1)
SODIUM: 139 mmol/L (ref 135–145)
Total Bilirubin: 0.7 mg/dL (ref 0.3–1.2)
Total Protein: 7.8 g/dL (ref 6.5–8.1)

## 2016-07-19 LAB — URINALYSIS, ROUTINE W REFLEX MICROSCOPIC
BILIRUBIN URINE: NEGATIVE
Glucose, UA: NEGATIVE mg/dL
Hgb urine dipstick: NEGATIVE
KETONES UR: 5 mg/dL — AB
Leukocytes, UA: NEGATIVE
NITRITE: NEGATIVE
PROTEIN: NEGATIVE mg/dL
SPECIFIC GRAVITY, URINE: 1.024 (ref 1.005–1.030)
pH: 5 (ref 5.0–8.0)

## 2016-07-19 LAB — I-STAT TROPONIN, ED: Troponin i, poc: 0 ng/mL (ref 0.00–0.08)

## 2016-07-19 LAB — I-STAT CG4 LACTIC ACID, ED: LACTIC ACID, VENOUS: 0.65 mmol/L (ref 0.5–1.9)

## 2016-07-19 MED ORDER — SODIUM CHLORIDE 0.9 % IV BOLUS (SEPSIS)
1000.0000 mL | Freq: Once | INTRAVENOUS | Status: AC
  Administered 2016-07-19: 1000 mL via INTRAVENOUS

## 2016-07-19 NOTE — ED Provider Notes (Signed)
[facility] DEPT Provider Note   CSN: [id number] Arrival date & time: 07/19/16  1010     History   Chief Complaint Chief Complaint  Patient presents with  . Weakness    HPI [name] [name] is a 83 y.o. male.  HPI   83 yo M with PMHx below including NSTEMI, prostate CA here with generalized weakness. Pt states that for the past week, he has had poor appetite. He has a chronic poor appetite but has not felt like he wanted to eat. He also broke out in a shingles rash to his left neck that is being treated by his home health RN. Earlier today, pt felt "okay" this AM and started making breakfast. He then felt extremely fatigued and was unable to eat due to his fatigue. He had associated general malaise but no pain. No CP, SOB, focal numbness or weakness. He feels mildly improved. He had no diaphoresis with this. He has h/o similar sx in the past from dehydration, also has been told that "everything checks out" with his PCP. Denies recent fevers.  Past Medical History:  Diagnosis Date  . Arthritis   . Diabetes mellitus   . Heart murmur   . Hypertension   . Hypothyroid   . MDS (myelodysplastic syndrome) ([place]) 12/2006    hyperplastic on bone marrow bx 2008  . NSTEMI (non-ST elevation myocardial infarction) ([place]) 06/2013   demand ischemia in setting of sepsis  . Prostate cancer [facility]) 1998 dx    Patient Active Problem List   Diagnosis Date Noted  . Pancytopenia, acquired ([place]) 08/03/2015  . Arm bruise 07/23/2015  . Routine general medical examination at a health care facility 05/01/2015  . CAP (community acquired pneumonia) 04/10/2015  . NSTEMI (non-ST elevated myocardial infarction) ([place]) 06/06/2013  . Chronic constipation   . Dizziness 01/14/2012  . Ataxia 01/14/2012  . B12 deficiency 01/14/2012  . Anxiety and depression 12/17/2011  . Hypertension   . Hypothyroid   . Prostate cancer ([place])   . Osteoarthritis   . 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 MDS (myelodysplastic syndrome) ([place]) 12/08/2006    Past Surgical History:  Procedure Laterality Date  . APPENDECTOMY    . TOTAL KNEE ARTHROPLASTY  2005   left       Home Medications    Prior to Admission medications   Medication Sig Start Date End Date Taking? Authorizing Provider  cephALEXin (KEFLEX) 500 MG capsule Take 500 mg by mouth 2 (two) times daily. 07/12/16 07/22/16 Yes Historical Provider, MD  levothyroxine (SYNTHROID, LEVOTHROID) 125 MCG tablet Take 1 tablet (125 mcg total) by mouth daily. 10/31/14  Yes Rowe Clack, MD  valACYclovir (VALTREX) 1000 MG tablet Take 1,000 mg by mouth 3 (three) times daily.   Yes Historical Provider, MD    Family History Family History  Problem Relation Age of Onset  . Prostate cancer Father 61    Social History Social History  Substance Use Topics  . Smoking status: Never Smoker  . Smokeless tobacco: Never Used  . Alcohol use No     Allergies   Statins   Review of Systems Review of Systems  Constitutional: Positive for fatigue. Negative for chills and fever.  HENT: Negative for congestion and rhinorrhea.   Eyes: Negative for visual disturbance.  Respiratory: Negative for cough, shortness of breath and wheezing.   Cardiovascular: Negative for chest pain and leg swelling.  Gastrointestinal: Negative for abdominal pain, diarrhea, nausea and vomiting.  Genitourinary:  Negative for dysuria and flank pain.  Musculoskeletal: Negative for neck pain and neck stiffness.  Skin: Negative for rash and wound.  Allergic/Immunologic: Negative for immunocompromised state.  Neurological: Positive for weakness and light-headedness. Negative for syncope and headaches.  All other systems reviewed and are negative.    Physical Exam Updated Vital Signs BP (!) 171/92 (BP Location: Right Arm)   Pulse 79   Temp 97.9 F (36.6 C) (Oral)   Resp 18   SpO2 100%   Physical Exam  Constitutional: He is oriented to  person, place, and time. He appears well-developed and well-nourished. No distress.  HENT:  Head: Normocephalic and atraumatic.  Mildly dry MM  Eyes: Conjunctivae are normal.  Neck: Neck supple.  Cardiovascular: Normal rate, regular rhythm and normal heart sounds.  Exam reveals no friction rub.   No murmur heard. Pulmonary/Chest: Effort normal and breath sounds normal. No respiratory distress. He has no wheezes. He has no rales.  Abdominal: Soft. Bowel sounds are normal. He exhibits no distension.  Musculoskeletal: He exhibits no edema.  Neurological: He is alert and oriented to person, place, and time. He exhibits normal muscle tone.  Skin: Skin is warm. Capillary refill takes less than 2 seconds.  Dried, scabbed vesicular rash to left neck and upper shoulder/upper chest. No induration. No fluctuance. No drainage. No extension to the face.  Psychiatric: He has a normal mood and affect.  Nursing note and vitals reviewed.    ED Treatments / Results  Labs (all labs ordered are listed, but only abnormal results are displayed) Labs Reviewed  CBC WITH DIFFERENTIAL/PLATELET - Abnormal; Notable for the following:       Result Value   WBC 3.2 (*)    RBC 3.99 (*)    Hemoglobin 11.2 (*)    HCT 33.2 (*)    Neutro Abs 1.6 (*)    All other components within normal limits  COMPREHENSIVE METABOLIC PANEL - Abnormal; Notable for the following:    Glucose, Bld 162 (*)    BUN 24 (*)    Creatinine, Ser 1.37 (*)    ALT 13 (*)    GFR calc non Af Amer 42 (*)    GFR calc Af Amer 48 (*)    All other components within normal limits  URINALYSIS, ROUTINE W REFLEX MICROSCOPIC - Abnormal; Notable for the following:    Ketones, ur 5 (*)    All other components within normal limits  I-STAT CHEM 8, ED - Abnormal; Notable for the following:    BUN 24 (*)    Creatinine, Ser 1.40 (*)    Glucose, Bld 160 (*)    Calcium, Ion 1.13 (*)    Hemoglobin 11.2 (*)    HCT 33.0 (*)    All other components within  normal limits  I-STAT CG4 LACTIC ACID, ED  I-STAT TROPOININ, ED    EKG  EKG Interpretation  Date/Time:  Friday July 19 2016 10:21:55 EDT Ventricular Rate:  90 PR Interval:    QRS Duration: 157 QT Interval:  382 QTC Calculation: 468 R Axis:   -55 Text Interpretation:  Sinus rhythm Prolonged PR interval RBBB and LAFB No significant change since last tracing Confirmed by Elaysia Devargas MD, Kinzlee Selvy [phone]) on 07/19/2016 10:33:18 AM       Radiology Dg Abdomen Acute W/chest  Result Date: 07/19/2016 CLINICAL DATA:  Weakness. EXAM: DG ABDOMEN ACUTE W/ 1V CHEST COMPARISON:  Radiographs of April 26, 2015. FINDINGS: There is no evidence of dilated bowel loops or free  intraperitoneal air. No radiopaque calculi or other significant radiographic abnormality is seen. Heart size and mediastinal contours are within normal limits. Both lungs are clear. IMPRESSION: No evidence of bowel obstruction or ileus. No acute cardiopulmonary disease. Electronically Signed   By: Marijo Conception, M.D.   On: 07/19/2016 11:20    Procedures Procedures (including critical care time)  Medications Ordered in ED Medications  sodium chloride 0.9 % bolus 1,000 mL (0 mLs Intravenous Stopped 07/19/16 1523)     Initial Impression / Assessment and Plan / ED Course  I have reviewed the triage vital signs and the nursing notes.  Pertinent labs & imaging results that were available during my care of the patient were reviewed by me and considered in my medical decision making (see chart for details).     83 yo M with PMHx below including NSTEMI, prostate CA here with generalized weakness. Suspect it is 2/2 dehydration and pt orthostatic here, mildly dehydrated clinically. IVF given with resolution of weakness/sx. Otherwise, lab work is unremarkable and reassuring. LA normal. Trop neg, ekg non-ischemic - doubt ACS, PE, dissection. UA without infection. AAS negative. He does have shingles rash but pain is controlled, he is on meds  for this, and no signs of bacterial superinfection. WBC is at baseline. This may have contributed to his dehydration but he is now tolerating PO, ambulating w/o difficulty. He would like to return home, which I feel is reasonable. Will advise PCP f/u.  Final Clinical Impressions(s) / ED Diagnoses   Final diagnoses:  Dehydration  Generalized weakness    New Prescriptions Discharge Medication List as of 07/19/2016  4:13 PM       Duffy Bruce, MD 07/20/16 1122

## 2016-07-19 NOTE — ED Notes (Signed)
RN is going to attempt to pull blood off patient's IV.

## 2016-07-19 NOTE — ED Triage Notes (Addendum)
Per EMS, pt is coming from home with complaints of weakness and dizziness. Pt was eating breakfast whenever the symptoms began. Pt denies chest pain, SOB, and N/V/D. EMS reports EKG showing sinus rhythm. Pt is AO x4 and lives home alone.

## 2016-07-19 NOTE — ED Notes (Signed)
Bed: ST41 Expected date: 07/19/16 Expected time: 10:10 AM Means of arrival: Ambulance Comments: Weak and dizzy

## 2016-07-19 NOTE — ED Notes (Signed)
Pt ambulated without difficulty

## 2016-11-25 ENCOUNTER — Encounter (INDEPENDENT_AMBULATORY_CARE_PROVIDER_SITE_OTHER): Payer: Medicare Other | Admitting: Ophthalmology

## 2016-11-25 DIAGNOSIS — H35033 Hypertensive retinopathy, bilateral: Secondary | ICD-10-CM

## 2016-11-25 DIAGNOSIS — I10 Essential (primary) hypertension: Secondary | ICD-10-CM | POA: Diagnosis not present

## 2016-11-25 DIAGNOSIS — E113392 Type 2 diabetes mellitus with moderate nonproliferative diabetic retinopathy without macular edema, left eye: Secondary | ICD-10-CM | POA: Diagnosis not present

## 2016-11-25 DIAGNOSIS — E113411 Type 2 diabetes mellitus with severe nonproliferative diabetic retinopathy with macular edema, right eye: Secondary | ICD-10-CM | POA: Diagnosis not present

## 2016-11-25 DIAGNOSIS — H43813 Vitreous degeneration, bilateral: Secondary | ICD-10-CM

## 2016-11-25 DIAGNOSIS — E11311 Type 2 diabetes mellitus with unspecified diabetic retinopathy with macular edema: Secondary | ICD-10-CM | POA: Diagnosis not present

## 2016-11-25 DIAGNOSIS — H353122 Nonexudative age-related macular degeneration, left eye, intermediate dry stage: Secondary | ICD-10-CM | POA: Diagnosis not present

## 2016-11-25 DIAGNOSIS — H34811 Central retinal vein occlusion, right eye, with macular edema: Secondary | ICD-10-CM | POA: Diagnosis not present

## 2016-12-11 ENCOUNTER — Emergency Department (HOSPITAL_COMMUNITY)
Admission: EM | Admit: 2016-12-11 | Discharge: 2016-12-11 | Disposition: A | Discharge status: Home / Self Care | Payer: Medicare Other | Attending: Emergency Medicine | Admitting: Emergency Medicine

## 2016-12-11 ENCOUNTER — Encounter (HOSPITAL_COMMUNITY): Payer: Self-pay

## 2016-12-11 ENCOUNTER — Emergency Department (HOSPITAL_COMMUNITY): Payer: Medicare Other

## 2016-12-11 DIAGNOSIS — Z8546 Personal history of malignant neoplasm of prostate: Secondary | ICD-10-CM | POA: Insufficient documentation

## 2016-12-11 DIAGNOSIS — E119 Type 2 diabetes mellitus without complications: Secondary | ICD-10-CM | POA: Diagnosis not present

## 2016-12-11 DIAGNOSIS — E039 Hypothyroidism, unspecified: Secondary | ICD-10-CM | POA: Insufficient documentation

## 2016-12-11 DIAGNOSIS — Z96652 Presence of left artificial knee joint: Secondary | ICD-10-CM | POA: Insufficient documentation

## 2016-12-11 DIAGNOSIS — K5641 Fecal impaction: Secondary | ICD-10-CM | POA: Diagnosis not present

## 2016-12-11 DIAGNOSIS — Z79899 Other long term (current) drug therapy: Secondary | ICD-10-CM | POA: Insufficient documentation

## 2016-12-11 DIAGNOSIS — Y9301 Activity, walking, marching and hiking: Secondary | ICD-10-CM | POA: Insufficient documentation

## 2016-12-11 DIAGNOSIS — Y999 Unspecified external cause status: Secondary | ICD-10-CM | POA: Insufficient documentation

## 2016-12-11 DIAGNOSIS — W01198A Fall on same level from slipping, tripping and stumbling with subsequent striking against other object, initial encounter: Secondary | ICD-10-CM | POA: Insufficient documentation

## 2016-12-11 DIAGNOSIS — S0990XA Unspecified injury of head, initial encounter: Secondary | ICD-10-CM | POA: Diagnosis present

## 2016-12-11 DIAGNOSIS — W19XXXA Unspecified fall, initial encounter: Secondary | ICD-10-CM

## 2016-12-11 DIAGNOSIS — Y92002 Bathroom of unspecified non-institutional (private) residence single-family (private) house as the place of occurrence of the external cause: Secondary | ICD-10-CM | POA: Diagnosis not present

## 2016-12-11 DIAGNOSIS — I10 Essential (primary) hypertension: Secondary | ICD-10-CM | POA: Insufficient documentation

## 2016-12-11 MED ORDER — FLEET ENEMA 7-19 GM/118ML RE ENEM
1.0000 | ENEMA | Freq: Once | RECTAL | Status: AC
  Administered 2016-12-11: 1 via RECTAL
  Filled 2016-12-11: qty 1

## 2016-12-11 NOTE — Discharge Instructions (Signed)
Try miralax at home.  A scoop in 8 oz of water every day until you have significant BM's, then back off.

## 2016-12-11 NOTE — ED Notes (Signed)
Enema successful. Small BM noted. MD made aware.

## 2016-12-11 NOTE — ED Notes (Signed)
Patient was alert, oriented and stable upon discharge. RN went over AVS and patient had no further questions.  Went home w/ daughter.

## 2016-12-11 NOTE — ED Provider Notes (Signed)
[facility] DEPT Provider Note   CSN: [id number] Arrival date & time: 12/11/16  1533     History   Chief Complaint Chief Complaint  Patient presents with  . Fall  . Constipation    HPI [name] [name] is a 83 y.o. male.  83 yo M with a chief complaint of a fall. Patient has an unsteady gait at baseline. Had been falling about every day for a while but that has improved. He was an area greater than the bathroom because he has been constipated and thought he had to have a bowel movement. He slipped on the floor on the way and bumped his head against the door. He denies headache denies neck pain denies chest pain shortness breath abdominal pain back pain or extremity pain. He has not had a bowel movement in about 5 days. He has been using suppositories without improvement. He denies fevers or chills.denies vomiting.   The history is provided by the patient.  Fall  This is a new problem. The current episode started 1 to 2 hours ago. The problem occurs every several days. The problem has been resolved. Pertinent negatives include no chest pain, no abdominal pain, no headaches and no shortness of breath. Nothing aggravates the symptoms. Nothing relieves the symptoms. He has tried nothing for the symptoms. The treatment provided no relief.  Constipation   Pertinent negatives include no abdominal pain.    Past Medical History:  Diagnosis Date  . Arthritis   . Diabetes mellitus   . Heart murmur   . Hypertension   . Hypothyroid   . MDS (myelodysplastic syndrome) ([place]) 12/2006    hyperplastic on bone marrow bx 2008  . NSTEMI (non-ST elevation myocardial infarction) ([place]) 06/2013   demand ischemia in setting of sepsis  . Prostate cancer [facility]) 1998 dx    Patient Active Problem List   Diagnosis Date Noted  . Pancytopenia, acquired ([place]) 08/03/2015  . Arm bruise 07/23/2015  . Routine general medical examination at a health care facility 05/01/2015  . CAP (community acquired  pneumonia) 04/10/2015  . NSTEMI (non-ST elevated myocardial infarction) ([place]) 06/06/2013  . Chronic constipation   . Dizziness 01/14/2012  . Ataxia 01/14/2012  . B12 deficiency 01/14/2012  . Anxiety and depression 12/17/2011  . Hypertension   . Hypothyroid   . Prostate cancer ([place])   . Osteoarthritis   . 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 ([place])   . MDS (myelodysplastic syndrome) ([place]) 12/08/2006    Past Surgical History:  Procedure Laterality Date  . APPENDECTOMY    . TOTAL KNEE ARTHROPLASTY  2005   left       Home Medications    Prior to Admission medications   Medication Sig Start Date End Date Taking? Authorizing Provider  levothyroxine (SYNTHROID, LEVOTHROID) 125 MCG tablet Take 1 tablet (125 mcg total) by mouth daily. 10/31/14   Rowe Clack, MD  valACYclovir (VALTREX) 1000 MG tablet Take 1,000 mg by mouth 3 (three) times daily.    [provider]    Family History Family History  Problem Relation Age of Onset  . Prostate cancer Father 25    Social History Social History  Substance Use Topics  . Smoking status: Never Smoker  . Smokeless tobacco: Never Used  . Alcohol use No     Allergies   Statins   Review of Systems Review of Systems  Constitutional: Negative for chills and fever.  HENT: Negative for congestion and facial swelling.   Eyes: Negative  for discharge and visual disturbance.  Respiratory: Negative for shortness of breath.   Cardiovascular: Negative for chest pain and palpitations.  Gastrointestinal: Positive for constipation. Negative for abdominal pain, diarrhea and vomiting.  Musculoskeletal: Negative for arthralgias and myalgias.  Skin: Negative for color change and rash.  Neurological: Negative for tremors, syncope and headaches.  Psychiatric/Behavioral: Negative for confusion and dysphoric mood.     Physical Exam Updated Vital Signs BP 115/66 (BP Location: Right Arm)   Pulse 88    Temp 99.7 F (37.6 C) (Oral)   Resp 18   SpO2 98%   Physical Exam  Constitutional: He is oriented to person, place, and time. He appears well-developed and well-nourished.  HENT:  Head: Normocephalic and atraumatic.  Eyes: Pupils are equal, round, and reactive to light. EOM are normal.  Neck: Normal range of motion. Neck supple. No JVD present.  Cardiovascular: Normal rate and regular rhythm.  Exam reveals no gallop and no friction rub.   No murmur heard. Pulmonary/Chest: No respiratory distress. He has no wheezes.  Abdominal: He exhibits no distension and no mass. There is no tenderness. There is no rebound and no guarding.  Genitourinary:  Genitourinary Comments: Large amount of soft stool in the vault  Musculoskeletal: Normal range of motion.  Palpated from head to toe without bony tenderness.  Neurological: He is alert and oriented to person, place, and time.  Skin: No rash noted. No pallor.  Psychiatric: He has a normal mood and affect. His behavior is normal.  Nursing note and vitals reviewed.    ED Treatments / Results  Labs (all labs ordered are listed, but only abnormal results are displayed) Labs Reviewed - No data to display  EKG  EKG Interpretation None       Radiology No results found.  Procedures Fecal disimpaction Date/Time: 12/11/2016 4:33 PM Performed by: Tyrone Nine Rodnisha Blomgren Authorized by: Deno Etienne  Consent: Verbal consent obtained. Risks and benefits: risks, benefits and alternatives were discussed Consent given by: patient Patient identity confirmed: verbally with patient Time out: Immediately prior to procedure a "time out" was called to verify the correct patient, procedure, equipment, support staff and site/side marked as required. Local anesthesia used: no  Anesthesia: Local anesthesia used: no  Sedation: Patient sedated: no Patient tolerance: Patient tolerated the procedure well with no immediate complications    (including critical care  time)  Medications Ordered in ED Medications  sodium phosphate (FLEET) 7-19 GM/118ML enema 1 enema (1 enema Rectal Given 12/11/16 1654)     Initial Impression / Assessment and Plan / ED Course  I have reviewed the triage vital signs and the nursing notes.  Pertinent labs & imaging results that were available during my care of the patient were reviewed by me and considered in my medical decision making (see chart for details).     83 yo M with a chief complaint of constipation. The patient also instantly fell while he was getting to the bathroom. He did struck his head. At 10 I told him likely we would get a CT of the head. He is okay with this. No signs of trauma. Disimpacted at bedside. Large amount of soft stool in the vault. With his advanced age we'll give a fleets enema.  CT head negative, not crossing over to epic.    6:19 PM:  I have discussed the diagnosis/risks/treatment options with the patient and believe the pt to be eligible for discharge home to follow-up with PCP. We also discussed returning to the  ED immediately if new or worsening sx occur. We discussed the sx which are most concerning (e.g., sudden worsening pain, fever, inability to tolerate by mouth) that necessitate immediate return. Medications administered to the patient during their visit and any new prescriptions provided to the patient are listed below.  Medications given during this visit Medications  sodium phosphate (FLEET) 7-19 GM/118ML enema 1 enema (1 enema Rectal Given 12/11/16 1654)     The patient appears reasonably screen and/or stabilized for discharge and I doubt any other medical condition or other [facility] requiring further screening, evaluation, or treatment in the ED at this time prior to discharge.    Final Clinical Impressions(s) / ED Diagnoses   Final diagnoses:  Fecal impaction in rectum [facility])  Injury of head, initial encounter  Fall, initial encounter    New Prescriptions New Prescriptions     No medications on file     Deno Etienne, DO 12/11/16 1819

## 2016-12-11 NOTE — ED Triage Notes (Signed)
He comes to [place] from home. He states he tripped whilst coming back from his bathroom. He has no c/o from the fall, rather, his biggest complaint is of inability to have b.m. X 5-6 days. His abd. Is soft and minimally tender to palpation.

## 2016-12-20 ENCOUNTER — Encounter (INDEPENDENT_AMBULATORY_CARE_PROVIDER_SITE_OTHER): Payer: Medicare Other | Admitting: Ophthalmology

## 2016-12-20 DIAGNOSIS — E113411 Type 2 diabetes mellitus with severe nonproliferative diabetic retinopathy with macular edema, right eye: Secondary | ICD-10-CM | POA: Diagnosis not present

## 2016-12-20 DIAGNOSIS — E113392 Type 2 diabetes mellitus with moderate nonproliferative diabetic retinopathy without macular edema, left eye: Secondary | ICD-10-CM

## 2016-12-20 DIAGNOSIS — H34831 Tributary (branch) retinal vein occlusion, right eye, with macular edema: Secondary | ICD-10-CM | POA: Diagnosis not present

## 2016-12-20 DIAGNOSIS — I10 Essential (primary) hypertension: Secondary | ICD-10-CM

## 2016-12-20 DIAGNOSIS — H35033 Hypertensive retinopathy, bilateral: Secondary | ICD-10-CM | POA: Diagnosis not present

## 2016-12-20 DIAGNOSIS — E11311 Type 2 diabetes mellitus with unspecified diabetic retinopathy with macular edema: Secondary | ICD-10-CM

## 2016-12-20 DIAGNOSIS — H43813 Vitreous degeneration, bilateral: Secondary | ICD-10-CM | POA: Diagnosis not present

## 2017-01-03 ENCOUNTER — Encounter (HOSPITAL_COMMUNITY): Payer: Self-pay

## 2017-01-03 ENCOUNTER — Emergency Department (HOSPITAL_COMMUNITY)
Admission: EM | Admit: 2017-01-03 | Discharge: 2017-01-03 | Disposition: A | Discharge status: Home / Self Care | Payer: Medicare Other | Attending: Emergency Medicine | Admitting: Emergency Medicine

## 2017-01-03 ENCOUNTER — Emergency Department (HOSPITAL_COMMUNITY): Payer: Medicare Other

## 2017-01-03 DIAGNOSIS — Z79899 Other long term (current) drug therapy: Secondary | ICD-10-CM | POA: Diagnosis not present

## 2017-01-03 DIAGNOSIS — R0602 Shortness of breath: Secondary | ICD-10-CM | POA: Diagnosis present

## 2017-01-03 DIAGNOSIS — I10 Essential (primary) hypertension: Secondary | ICD-10-CM | POA: Insufficient documentation

## 2017-01-03 DIAGNOSIS — Z8546 Personal history of malignant neoplasm of prostate: Secondary | ICD-10-CM | POA: Diagnosis not present

## 2017-01-03 DIAGNOSIS — Z96652 Presence of left artificial knee joint: Secondary | ICD-10-CM | POA: Diagnosis not present

## 2017-01-03 DIAGNOSIS — R911 Solitary pulmonary nodule: Secondary | ICD-10-CM | POA: Insufficient documentation

## 2017-01-03 DIAGNOSIS — E039 Hypothyroidism, unspecified: Secondary | ICD-10-CM | POA: Insufficient documentation

## 2017-01-03 DIAGNOSIS — E1149 Type 2 diabetes mellitus with other diabetic neurological complication: Secondary | ICD-10-CM | POA: Diagnosis not present

## 2017-01-03 MED ORDER — SODIUM CHLORIDE 0.9 % IV BOLUS (SEPSIS): 500.0000 mL | Freq: Once | INTRAVENOUS | Status: DC

## 2017-01-03 NOTE — ED Notes (Signed)
98% RA ambulating with walker

## 2017-01-03 NOTE — ED Notes (Signed)
Went in room to assist patient with ambulating, patient sat on the side of the bed and then stated he was too dizzy to stand and walk.

## 2017-01-03 NOTE — ED Notes (Signed)
Bed: ZO10 Expected date:  Expected time:  Means of arrival:  Comments: EMS- 75y M, SOB

## 2017-01-03 NOTE — Discharge Instructions (Signed)
YOU HAVE A LUNG NODULE.  A FOLLOW UP CT SCAN IS RECOMMENDED TO FURTHER ASSESS THIS.  YOU NEED TO HAVE CLOSE FOLLOW UP WITH YOUR PRIMARY CARE PROVIDER TO ARRANGE THIS.

## 2017-01-03 NOTE — ED Triage Notes (Signed)
Per EMS, pt comes from home, c/o sudden onset SOB. Pt reports he feel asleep on the recliner and woke up having difficulty breathing. Per EMS, patients lips were white/blue on arrival, pink upon arrival to ED. Hx of chronic productive cough with clear sputum that he has been seen for multiple times. No complaints now from pt.

## 2017-01-03 NOTE — ED Provider Notes (Addendum)
[facility] DEPT Provider Note   CSN: [id number] Arrival date & time: 01/03/17  1045     History   Chief Complaint Chief Complaint  Patient presents with  . Shortness of Breath    HPI [name] [name] is a 83 y.o. male.  Patient is a 83 year old male who presents with shortness of breath. He states that he hasn't been sleeping well at night and is been sleeping during the day. He went to sleep in his recliner and when he woke up he was feeling short of breath. This lasted for about 20 minutes. He denies any chest pain. No cough or congestion. No nasal congestion. No fevers. No leg swelling. He states he feels fine now and denies any current symptoms. He denies any ongoing shortness of breath. No history of similar symptoms in the past.      Past Medical History:  Diagnosis Date  . Arthritis   . Diabetes mellitus   . Heart murmur   . Hypertension   . Hypothyroid   . MDS (myelodysplastic syndrome) ([place]) 12/2006    hyperplastic on bone marrow bx 2008  . NSTEMI (non-ST elevation myocardial infarction) ([place]) 06/2013   demand ischemia in setting of sepsis  . Prostate cancer [facility]) 1998 dx    Patient Active Problem List   Diagnosis Date Noted  . Pancytopenia, acquired ([place]) 08/03/2015  . Arm bruise 07/23/2015  . Routine general medical examination at a health care facility 05/01/2015  . CAP (community acquired pneumonia) 04/10/2015  . NSTEMI (non-ST elevated myocardial infarction) ([place]) 06/06/2013  . Chronic constipation   . Dizziness 01/14/2012  . Ataxia 01/14/2012  . B12 deficiency 01/14/2012  . Anxiety and depression 12/17/2011  . Hypertension   . Hypothyroid   . Prostate cancer ([place])   . Osteoarthritis   . 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 MDS (myelodysplastic syndrome) ([place]) 12/08/2006    Past Surgical History:  Procedure Laterality Date  . APPENDECTOMY    . TOTAL KNEE ARTHROPLASTY  2005   left       Home  Medications    Prior to Admission medications   Medication Sig Start Date End Date Taking? Authorizing Provider  Docusate Calcium (STOOL SOFTENER PO) Take 2 tablets by mouth daily as needed.   Yes [provider]  levothyroxine (SYNTHROID, LEVOTHROID) 125 MCG tablet Take 1 tablet (125 mcg total) by mouth daily. 10/31/14  Yes Rowe Clack, MD    Family History Family History  Problem Relation Age of Onset  . Prostate cancer Father 49    Social History Social History  Substance Use Topics  . Smoking status: Never Smoker  . Smokeless tobacco: Never Used  . Alcohol use No     Allergies   Statins   Review of Systems Review of Systems  Constitutional: Negative for chills, diaphoresis, fatigue and fever.  HENT: Negative for congestion, rhinorrhea and sneezing.   Eyes: Negative.   Respiratory: Positive for shortness of breath. Negative for cough and chest tightness.   Cardiovascular: Negative for chest pain and leg swelling.  Gastrointestinal: Negative for abdominal pain, blood in stool, diarrhea, nausea and vomiting.  Genitourinary: Negative for difficulty urinating, flank pain, frequency and hematuria.  Musculoskeletal: Negative for arthralgias and back pain.  Skin: Negative for rash.  Neurological: Negative for dizziness, speech difficulty, weakness, numbness and headaches.     Physical Exam Updated Vital Signs BP 122/62   Pulse 70   Temp 97.7 F (36.5 C) (Oral)  Resp 13   Ht 5\' 7"  (1.702 m)   Wt 68.9 kg (152 lb)   SpO2 98%   BMI 23.81 kg/m   Physical Exam  Constitutional: He is oriented to person, place, and time. He appears well-developed and well-nourished.  HENT:  Head: Normocephalic and atraumatic.  Eyes: Pupils are equal, round, and reactive to light.  Neck: Normal range of motion. Neck supple.  Cardiovascular: Normal rate, regular rhythm and normal heart sounds.   Pulmonary/Chest: Effort normal and breath sounds normal. No respiratory  distress. He has no wheezes. He has no rales. He exhibits no tenderness.  Abdominal: Soft. Bowel sounds are normal. There is no tenderness. There is no rebound and no guarding.  Musculoskeletal: Normal range of motion. He exhibits no edema.  Lymphadenopathy:    He has no cervical adenopathy.  Neurological: He is alert and oriented to person, place, and time.  Skin: Skin is warm and dry. No rash noted.  Psychiatric: He has a normal mood and affect.     ED Treatments / Results  Labs (all labs ordered are listed, but only abnormal results are displayed) Labs Reviewed - No data to display  EKG  EKG Interpretation  Date/Time:  Friday January 03 2017 11:24:13 EDT Ventricular Rate:  77 PR Interval:    QRS Duration: 158 QT Interval:  419 QTC Calculation: 475 R Axis:   -27 Text Interpretation:  Sinus rhythm Prolonged PR interval Right bundle branch block since last tracing no significant change Confirmed by Malvin Johns [phone]) on 01/03/2017 12:57:07 PM       Radiology Dg Chest 2 View  Result Date: 01/03/2017 CLINICAL DATA:  Shortness of Breath Chronic cough. EXAM: CHEST  2 VIEW COMPARISON:  July 19, 2016 FINDINGS: There is a 1.2 x 1.0 cm nodular opacity in the left mid lung region. The lungs elsewhere are clear. No edema or consolidation. Heart size and pulmonary vascularity are normal. No adenopathy. There is mild degenerative change in the thoracic spine. There is degenerative change in the right shoulder with superior migration of the right humeral head. IMPRESSION: 1.2 x 1.0 cm nodular opacity over left mid lung. Advise noncontrast enhanced chest CT to further evaluate. No edema or consolidation. Stable cardiac silhouette. Chronic rotator cuff tear on the right, characterized by superior migration of the right humeral head. Electronically Signed   By: Lowella Grip III M.D.   On: 01/03/2017 12:39    Procedures Procedures (including critical care time)  Medications Ordered  in ED Medications  sodium chloride 0.9 % bolus 500 mL (not administered)     Initial Impression / Assessment and Plan / ED Course  I have reviewed the triage vital signs and the nursing notes.  Pertinent labs & imaging results that were available during my care of the patient were reviewed by me and considered in my medical decision making (see chart for details).    Patient is a 83 year old male who presents with shortness of breath. He has been completely asymptomatic since arrival to the emergency department. His chest x-ray is clear without evidence of pneumonia. There is no evidence of pulmonary edema. There is a pulmonary nodule. I discussed this finding with the patient and that he needs to have close follow-up with his primary care physician to further assess this lung nodule. He acknowledges this. He's had no hypoxia in the ED. No tachycardia.He's able to ambulate without symptoms. Initially when the ER tech got him up to ambulate him he sat on the  side of bed and said he felt a little dizzy. However 5 minutes later it was tried again and he is completely asymptomatic. I initially had ordered some blood work after he complained of the dizziness however he hasn't had any further symptoms. He felt like he just sat up too fast. Given that he has no ongoing dizziness, shortness of breath or chest pain, I did not feel that lab work was indicated currently. He has no suggestions of acute coronary syndrome. No suggestions of pulmonary embolus. He was discharged home in good condition. He was encouraged to have close follow-up with his PCP. Return precautions were given.  PT lives at home alone and says that he is able to take care of his daily needs however he would be happy if there was someone that can come in the house and help them from time to time. I did offer to consult case management but he states that he has already talked to the [facility] regarding this and they are working on  it.  Final Clinical Impressions(s) / ED Diagnoses   Final diagnoses:  SOB (shortness of breath)  Pulmonary nodule    New Prescriptions New Prescriptions   No medications on file     Malvin Johns, MD 01/03/17 1412    Malvin Johns, MD 01/03/17 1434    Malvin Johns, MD 01/03/17 1435

## 2017-01-03 NOTE — ED Notes (Addendum)
Spoke w/ Pt's daughter.  She reports she cannot be here "for several hours."  Sts she will call other relatives to come get him.  Pt's daughter: Tye [place] [phone]

## 2017-01-03 NOTE — ED Notes (Signed)
Patient transported to X-ray 

## 2017-01-17 ENCOUNTER — Encounter (INDEPENDENT_AMBULATORY_CARE_PROVIDER_SITE_OTHER): Payer: Medicare Other | Admitting: Ophthalmology

## 2017-01-24 ENCOUNTER — Encounter (INDEPENDENT_AMBULATORY_CARE_PROVIDER_SITE_OTHER): Payer: Medicare Other | Admitting: Ophthalmology

## 2017-01-24 DIAGNOSIS — H35033 Hypertensive retinopathy, bilateral: Secondary | ICD-10-CM

## 2017-01-24 DIAGNOSIS — H43813 Vitreous degeneration, bilateral: Secondary | ICD-10-CM

## 2017-01-24 DIAGNOSIS — E11311 Type 2 diabetes mellitus with unspecified diabetic retinopathy with macular edema: Secondary | ICD-10-CM | POA: Diagnosis not present

## 2017-01-24 DIAGNOSIS — H353132 Nonexudative age-related macular degeneration, bilateral, intermediate dry stage: Secondary | ICD-10-CM

## 2017-01-24 DIAGNOSIS — E113212 Type 2 diabetes mellitus with mild nonproliferative diabetic retinopathy with macular edema, left eye: Secondary | ICD-10-CM | POA: Diagnosis not present

## 2017-01-24 DIAGNOSIS — I10 Essential (primary) hypertension: Secondary | ICD-10-CM

## 2017-01-24 DIAGNOSIS — E113311 Type 2 diabetes mellitus with moderate nonproliferative diabetic retinopathy with macular edema, right eye: Secondary | ICD-10-CM

## 2017-01-24 DIAGNOSIS — H34811 Central retinal vein occlusion, right eye, with macular edema: Secondary | ICD-10-CM | POA: Diagnosis not present

## 2017-02-04 ENCOUNTER — Ambulatory Visit: Payer: Medicare Other | Admitting: Family Medicine

## 2017-02-21 ENCOUNTER — Encounter (INDEPENDENT_AMBULATORY_CARE_PROVIDER_SITE_OTHER): Payer: Medicare Other | Admitting: Ophthalmology

## 2017-02-21 DIAGNOSIS — H34811 Central retinal vein occlusion, right eye, with macular edema: Secondary | ICD-10-CM | POA: Diagnosis not present

## 2017-02-21 DIAGNOSIS — H353132 Nonexudative age-related macular degeneration, bilateral, intermediate dry stage: Secondary | ICD-10-CM | POA: Diagnosis not present

## 2017-02-21 DIAGNOSIS — H35033 Hypertensive retinopathy, bilateral: Secondary | ICD-10-CM

## 2017-02-21 DIAGNOSIS — H43813 Vitreous degeneration, bilateral: Secondary | ICD-10-CM

## 2017-02-21 DIAGNOSIS — I10 Essential (primary) hypertension: Secondary | ICD-10-CM | POA: Diagnosis not present

## 2017-03-21 ENCOUNTER — Encounter (INDEPENDENT_AMBULATORY_CARE_PROVIDER_SITE_OTHER): Payer: Medicare Other | Admitting: Ophthalmology

## 2017-03-21 DIAGNOSIS — H43813 Vitreous degeneration, bilateral: Secondary | ICD-10-CM | POA: Diagnosis not present

## 2017-03-21 DIAGNOSIS — E113313 Type 2 diabetes mellitus with moderate nonproliferative diabetic retinopathy with macular edema, bilateral: Secondary | ICD-10-CM

## 2017-03-21 DIAGNOSIS — H35033 Hypertensive retinopathy, bilateral: Secondary | ICD-10-CM

## 2017-03-21 DIAGNOSIS — H353122 Nonexudative age-related macular degeneration, left eye, intermediate dry stage: Secondary | ICD-10-CM

## 2017-03-21 DIAGNOSIS — I10 Essential (primary) hypertension: Secondary | ICD-10-CM | POA: Diagnosis not present

## 2017-03-21 DIAGNOSIS — H34811 Central retinal vein occlusion, right eye, with macular edema: Secondary | ICD-10-CM

## 2017-03-21 DIAGNOSIS — E11311 Type 2 diabetes mellitus with unspecified diabetic retinopathy with macular edema: Secondary | ICD-10-CM

## 2017-05-01 ENCOUNTER — Encounter (INDEPENDENT_AMBULATORY_CARE_PROVIDER_SITE_OTHER): Payer: Medicare Other | Admitting: Ophthalmology

## 2017-05-01 DIAGNOSIS — E11311 Type 2 diabetes mellitus with unspecified diabetic retinopathy with macular edema: Secondary | ICD-10-CM

## 2017-05-01 DIAGNOSIS — E113212 Type 2 diabetes mellitus with mild nonproliferative diabetic retinopathy with macular edema, left eye: Secondary | ICD-10-CM

## 2017-05-01 DIAGNOSIS — H353132 Nonexudative age-related macular degeneration, bilateral, intermediate dry stage: Secondary | ICD-10-CM | POA: Diagnosis not present

## 2017-05-01 DIAGNOSIS — I10 Essential (primary) hypertension: Secondary | ICD-10-CM

## 2017-05-01 DIAGNOSIS — E113311 Type 2 diabetes mellitus with moderate nonproliferative diabetic retinopathy with macular edema, right eye: Secondary | ICD-10-CM

## 2017-05-01 DIAGNOSIS — H35033 Hypertensive retinopathy, bilateral: Secondary | ICD-10-CM

## 2017-05-01 DIAGNOSIS — H34811 Central retinal vein occlusion, right eye, with macular edema: Secondary | ICD-10-CM | POA: Diagnosis not present

## 2017-05-01 DIAGNOSIS — H43813 Vitreous degeneration, bilateral: Secondary | ICD-10-CM

## 2017-06-12 ENCOUNTER — Encounter (INDEPENDENT_AMBULATORY_CARE_PROVIDER_SITE_OTHER): Payer: Medicare Other | Admitting: Ophthalmology

## 2017-06-12 DIAGNOSIS — I10 Essential (primary) hypertension: Secondary | ICD-10-CM | POA: Diagnosis not present

## 2017-06-12 DIAGNOSIS — E11311 Type 2 diabetes mellitus with unspecified diabetic retinopathy with macular edema: Secondary | ICD-10-CM

## 2017-06-12 DIAGNOSIS — H34811 Central retinal vein occlusion, right eye, with macular edema: Secondary | ICD-10-CM | POA: Diagnosis not present

## 2017-06-12 DIAGNOSIS — H353132 Nonexudative age-related macular degeneration, bilateral, intermediate dry stage: Secondary | ICD-10-CM

## 2017-06-12 DIAGNOSIS — H35033 Hypertensive retinopathy, bilateral: Secondary | ICD-10-CM | POA: Diagnosis not present

## 2017-06-12 DIAGNOSIS — H43813 Vitreous degeneration, bilateral: Secondary | ICD-10-CM | POA: Diagnosis not present

## 2017-06-12 DIAGNOSIS — E113313 Type 2 diabetes mellitus with moderate nonproliferative diabetic retinopathy with macular edema, bilateral: Secondary | ICD-10-CM | POA: Diagnosis not present

## 2017-07-24 ENCOUNTER — Encounter (INDEPENDENT_AMBULATORY_CARE_PROVIDER_SITE_OTHER): Payer: Medicare Other | Admitting: Ophthalmology

## 2017-07-24 DIAGNOSIS — E113391 Type 2 diabetes mellitus with moderate nonproliferative diabetic retinopathy without macular edema, right eye: Secondary | ICD-10-CM

## 2017-07-24 DIAGNOSIS — I10 Essential (primary) hypertension: Secondary | ICD-10-CM | POA: Diagnosis not present

## 2017-07-24 DIAGNOSIS — H353132 Nonexudative age-related macular degeneration, bilateral, intermediate dry stage: Secondary | ICD-10-CM | POA: Diagnosis not present

## 2017-07-24 DIAGNOSIS — H35033 Hypertensive retinopathy, bilateral: Secondary | ICD-10-CM | POA: Diagnosis not present

## 2017-07-24 DIAGNOSIS — H43813 Vitreous degeneration, bilateral: Secondary | ICD-10-CM | POA: Diagnosis not present

## 2017-07-24 DIAGNOSIS — E11311 Type 2 diabetes mellitus with unspecified diabetic retinopathy with macular edema: Secondary | ICD-10-CM

## 2017-07-24 DIAGNOSIS — E113312 Type 2 diabetes mellitus with moderate nonproliferative diabetic retinopathy with macular edema, left eye: Secondary | ICD-10-CM | POA: Diagnosis not present

## 2017-07-24 DIAGNOSIS — H348112 Central retinal vein occlusion, right eye, stable: Secondary | ICD-10-CM | POA: Diagnosis not present

## 2017-09-11 ENCOUNTER — Encounter (INDEPENDENT_AMBULATORY_CARE_PROVIDER_SITE_OTHER): Payer: Medicare Other | Admitting: Ophthalmology

## 2017-09-12 ENCOUNTER — Encounter (INDEPENDENT_AMBULATORY_CARE_PROVIDER_SITE_OTHER): Payer: Medicare Other | Admitting: Ophthalmology

## 2017-09-12 DIAGNOSIS — E113312 Type 2 diabetes mellitus with moderate nonproliferative diabetic retinopathy with macular edema, left eye: Secondary | ICD-10-CM | POA: Diagnosis not present

## 2017-09-12 DIAGNOSIS — H353132 Nonexudative age-related macular degeneration, bilateral, intermediate dry stage: Secondary | ICD-10-CM | POA: Diagnosis not present

## 2017-09-12 DIAGNOSIS — H348112 Central retinal vein occlusion, right eye, stable: Secondary | ICD-10-CM

## 2017-09-12 DIAGNOSIS — I10 Essential (primary) hypertension: Secondary | ICD-10-CM | POA: Diagnosis not present

## 2017-09-12 DIAGNOSIS — H43813 Vitreous degeneration, bilateral: Secondary | ICD-10-CM

## 2017-09-12 DIAGNOSIS — E11311 Type 2 diabetes mellitus with unspecified diabetic retinopathy with macular edema: Secondary | ICD-10-CM

## 2017-09-12 DIAGNOSIS — H35033 Hypertensive retinopathy, bilateral: Secondary | ICD-10-CM

## 2017-09-12 DIAGNOSIS — E113391 Type 2 diabetes mellitus with moderate nonproliferative diabetic retinopathy without macular edema, right eye: Secondary | ICD-10-CM | POA: Diagnosis not present

## 2017-10-26 ENCOUNTER — Encounter (HOSPITAL_COMMUNITY): Payer: Self-pay | Admitting: Emergency Medicine

## 2017-10-26 ENCOUNTER — Emergency Department (HOSPITAL_COMMUNITY): Payer: Medicare Other

## 2017-10-26 ENCOUNTER — Other Ambulatory Visit: Payer: Self-pay

## 2017-10-26 ENCOUNTER — Observation Stay (HOSPITAL_COMMUNITY)
Admission: EM | Admit: 2017-10-26 | Discharge: 2017-10-29 | Disposition: A | Discharge status: Home / Self Care | Payer: Medicare Other | Attending: Internal Medicine | Admitting: Internal Medicine

## 2017-10-26 DIAGNOSIS — I10 Essential (primary) hypertension: Secondary | ICD-10-CM | POA: Diagnosis present

## 2017-10-26 DIAGNOSIS — M1990 Unspecified osteoarthritis, unspecified site: Secondary | ICD-10-CM | POA: Diagnosis present

## 2017-10-26 DIAGNOSIS — Z79890 Hormone replacement therapy (postmenopausal): Secondary | ICD-10-CM | POA: Insufficient documentation

## 2017-10-26 DIAGNOSIS — R42 Dizziness and giddiness: Secondary | ICD-10-CM | POA: Diagnosis not present

## 2017-10-26 DIAGNOSIS — E039 Hypothyroidism, unspecified: Secondary | ICD-10-CM | POA: Diagnosis not present

## 2017-10-26 DIAGNOSIS — R531 Weakness: Secondary | ICD-10-CM | POA: Insufficient documentation

## 2017-10-26 DIAGNOSIS — Z66 Do not resuscitate: Secondary | ICD-10-CM | POA: Insufficient documentation

## 2017-10-26 DIAGNOSIS — D469 Myelodysplastic syndrome, unspecified: Secondary | ICD-10-CM | POA: Diagnosis present

## 2017-10-26 DIAGNOSIS — R296 Repeated falls: Secondary | ICD-10-CM | POA: Diagnosis not present

## 2017-10-26 DIAGNOSIS — E1141 Type 2 diabetes mellitus with diabetic mononeuropathy: Secondary | ICD-10-CM | POA: Diagnosis not present

## 2017-10-26 DIAGNOSIS — E1149 Type 2 diabetes mellitus with other diabetic neurological complication: Secondary | ICD-10-CM | POA: Diagnosis not present

## 2017-10-26 DIAGNOSIS — I252 Old myocardial infarction: Secondary | ICD-10-CM | POA: Diagnosis not present

## 2017-10-26 DIAGNOSIS — D61818 Other pancytopenia: Secondary | ICD-10-CM | POA: Diagnosis not present

## 2017-10-26 DIAGNOSIS — R05 Cough: Secondary | ICD-10-CM | POA: Diagnosis not present

## 2017-10-26 DIAGNOSIS — R338 Other retention of urine: Secondary | ICD-10-CM | POA: Diagnosis present

## 2017-10-26 DIAGNOSIS — K5909 Other constipation: Secondary | ICD-10-CM | POA: Diagnosis not present

## 2017-10-26 DIAGNOSIS — Z8546 Personal history of malignant neoplasm of prostate: Secondary | ICD-10-CM | POA: Diagnosis not present

## 2017-10-26 DIAGNOSIS — I2510 Atherosclerotic heart disease of native coronary artery without angina pectoris: Secondary | ICD-10-CM | POA: Diagnosis not present

## 2017-10-26 DIAGNOSIS — Z96652 Presence of left artificial knee joint: Secondary | ICD-10-CM | POA: Insufficient documentation

## 2017-10-26 DIAGNOSIS — C61 Malignant neoplasm of prostate: Secondary | ICD-10-CM | POA: Diagnosis present

## 2017-10-26 DIAGNOSIS — H5461 Unqualified visual loss, right eye, normal vision left eye: Secondary | ICD-10-CM | POA: Insufficient documentation

## 2017-10-26 DIAGNOSIS — R339 Retention of urine, unspecified: Secondary | ICD-10-CM | POA: Diagnosis not present

## 2017-10-26 LAB — CBC
HCT: 34.8 % — ABNORMAL LOW (ref 39.0–52.0)
Hemoglobin: 11.4 g/dL — ABNORMAL LOW (ref 13.0–17.0)
MCH: 28.7 pg (ref 26.0–34.0)
MCHC: 32.8 g/dL (ref 30.0–36.0)
MCV: 87.7 fL (ref 78.0–100.0)
PLATELETS: 91 10*3/uL — AB (ref 150–400)
RBC: 3.97 MIL/uL — AB (ref 4.22–5.81)
RDW: 13.6 % (ref 11.5–15.5)
WBC: 3 10*3/uL — AB (ref 4.0–10.5)

## 2017-10-26 LAB — COMPREHENSIVE METABOLIC PANEL
ALBUMIN: 3.3 g/dL — AB (ref 3.5–5.0)
ALK PHOS: 77 U/L (ref 38–126)
ALT: 10 U/L (ref 0–44)
AST: 14 U/L — AB (ref 15–41)
Anion gap: 6 (ref 5–15)
BILIRUBIN TOTAL: 0.7 mg/dL (ref 0.3–1.2)
BUN: 15 mg/dL (ref 8–23)
CALCIUM: 8.8 mg/dL — AB (ref 8.9–10.3)
CO2: 26 mmol/L (ref 22–32)
Chloride: 112 mmol/L — ABNORMAL HIGH (ref 98–111)
Creatinine, Ser: 1.05 mg/dL (ref 0.61–1.24)
GFR calc Af Amer: >60 mL/min (ref >60)
GFR calc non Af Amer: 57 mL/min — ABNORMAL LOW (ref >60)
GLUCOSE: 137 mg/dL — AB (ref 70–99)
Potassium: 4.1 mmol/L (ref 3.5–5.1)
SODIUM: 144 mmol/L (ref 135–145)
Total Protein: 6 g/dL — ABNORMAL LOW (ref 6.5–8.1)

## 2017-10-26 LAB — DIFFERENTIAL
Abs Immature Granulocytes: 0 10*3/uL (ref 0.0–0.1)
BASOS ABS: 0 10*3/uL (ref 0.0–0.1)
BASOS PCT: 0 %
EOS ABS: 0 10*3/uL (ref 0.0–0.7)
EOS PCT: 1 %
Immature Granulocytes: 1 %
Lymphocytes Relative: 32 %
Lymphs Abs: 0.9 10*3/uL (ref 0.7–4.0)
MONO ABS: 0.2 10*3/uL (ref 0.1–1.0)
MONOS PCT: 8 %
NEUTROS PCT: 58 %
Neutro Abs: 1.7 10*3/uL (ref 1.7–7.7)

## 2017-10-26 LAB — URINALYSIS, ROUTINE W REFLEX MICROSCOPIC
BACTERIA UA: NONE SEEN
BILIRUBIN URINE: NEGATIVE
Glucose, UA: NEGATIVE mg/dL
KETONES UR: NEGATIVE mg/dL
LEUKOCYTES UA: NEGATIVE
NITRITE: NEGATIVE
PH: 5 (ref 5.0–8.0)
PROTEIN: NEGATIVE mg/dL
Specific Gravity, Urine: 1.017 (ref 1.005–1.030)

## 2017-10-26 LAB — RAPID URINE DRUG SCREEN, HOSP PERFORMED
Amphetamines: NOT DETECTED
BARBITURATES: NOT DETECTED
Benzodiazepines: NOT DETECTED
COCAINE: NOT DETECTED
OPIATES: NOT DETECTED
Tetrahydrocannabinol: NOT DETECTED

## 2017-10-26 LAB — I-STAT CHEM 8, ED
BUN: 17 mg/dL (ref 8–23)
CHLORIDE: 107 mmol/L (ref 98–111)
Calcium, Ion: 1.2 mmol/L (ref 1.15–1.40)
Creatinine, Ser: 1 mg/dL (ref 0.61–1.24)
GLUCOSE: 132 mg/dL — AB (ref 70–99)
HCT: 32 % — ABNORMAL LOW (ref 39.0–52.0)
Hemoglobin: 10.9 g/dL — ABNORMAL LOW (ref 13.0–17.0)
Potassium: 4.1 mmol/L (ref 3.5–5.1)
Sodium: 143 mmol/L (ref 135–145)
TCO2: 24 mmol/L (ref 22–32)

## 2017-10-26 LAB — I-STAT TROPONIN, ED: Troponin i, poc: 0 ng/mL (ref 0.00–0.08)

## 2017-10-26 LAB — PROTIME-INR
INR: 1.12
Prothrombin Time: 14.3 seconds (ref 11.4–15.2)

## 2017-10-26 LAB — ETHANOL

## 2017-10-26 LAB — APTT: aPTT: 30 seconds (ref 24–36)

## 2017-10-26 MED ORDER — MECLIZINE HCL 25 MG PO TABS
12.5000 mg | ORAL_TABLET | Freq: Once | ORAL | Status: AC
Start: 2017-10-26 — End: 2017-10-26
  Administered 2017-10-26: 12.5 mg via ORAL
  Filled 2017-10-26: qty 1

## 2017-10-26 MED ORDER — DOCUSATE SODIUM 100 MG PO CAPS
100.0000 mg | ORAL_CAPSULE | Freq: Two times a day (BID) | ORAL | Status: DC | PRN
  Administered 2017-10-29: 100 mg via ORAL
  Filled 2017-10-26: qty 1

## 2017-10-26 MED ORDER — LEVOTHYROXINE SODIUM 125 MCG PO TABS
125.0000 ug | ORAL_TABLET | Freq: Every day | ORAL | Status: DC
  Administered 2017-10-27 – 2017-10-29 (×3): 125 ug via ORAL
  Filled 2017-10-26 (×4): qty 1

## 2017-10-26 MED ORDER — POTASSIUM CHLORIDE IN NACL 20-0.9 MEQ/L-% IV SOLN
INTRAVENOUS | Status: AC
  Administered 2017-10-26: 18:00:00 via INTRAVENOUS
  Filled 2017-10-26 (×3): qty 1000

## 2017-10-26 MED ORDER — ONDANSETRON HCL 4 MG PO TABS: 4.0000 mg | ORAL_TABLET | Freq: Four times a day (QID) | ORAL | Status: DC | PRN

## 2017-10-26 MED ORDER — ACETAMINOPHEN 325 MG PO TABS: 650.0000 mg | ORAL_TABLET | Freq: Four times a day (QID) | ORAL | Status: DC | PRN

## 2017-10-26 MED ORDER — ONDANSETRON HCL 4 MG/2ML IJ SOLN: 4.0000 mg | Freq: Four times a day (QID) | INTRAMUSCULAR | Status: DC | PRN

## 2017-10-26 MED ORDER — ACETAMINOPHEN 650 MG RE SUPP: 650.0000 mg | Freq: Four times a day (QID) | RECTAL | Status: DC | PRN

## 2017-10-26 MED ORDER — SODIUM CHLORIDE 0.9 % IV BOLUS
500.0000 mL | Freq: Once | INTRAVENOUS | Status: AC
  Administered 2017-10-26: 500 mL via INTRAVENOUS

## 2017-10-26 MED ORDER — SODIUM CHLORIDE 0.9% FLUSH: 3.0000 mL | INTRAVENOUS | Status: DC | PRN

## 2017-10-26 MED ORDER — SODIUM CHLORIDE 0.9% FLUSH
3.0000 mL | Freq: Two times a day (BID) | INTRAVENOUS | Status: DC
  Administered 2017-10-27 – 2017-10-29 (×3): 3 mL via INTRAVENOUS

## 2017-10-26 MED ORDER — KETOROLAC TROMETHAMINE 15 MG/ML IJ SOLN: 15.0000 mg | Freq: Four times a day (QID) | INTRAMUSCULAR | Status: DC | PRN

## 2017-10-26 MED ORDER — SODIUM CHLORIDE 0.9 % IV SOLN: 250.0000 mL | INTRAVENOUS | Status: DC | PRN

## 2017-10-26 MED ORDER — ENOXAPARIN SODIUM 40 MG/0.4ML ~~LOC~~ SOLN
40.0000 mg | SUBCUTANEOUS | Status: DC
  Administered 2017-10-27 – 2017-10-28 (×3): 40 mg via SUBCUTANEOUS
  Filled 2017-10-26 (×3): qty 0.4

## 2017-10-26 MED ORDER — TRAMADOL HCL 50 MG PO TABS: 50.0000 mg | ORAL_TABLET | Freq: Four times a day (QID) | ORAL | Status: DC | PRN

## 2017-10-26 MED ORDER — SODIUM CHLORIDE 0.9% FLUSH: 3.0000 mL | Freq: Two times a day (BID) | INTRAVENOUS | Status: DC

## 2017-10-26 NOTE — ED Provider Notes (Signed)
[facility] EMERGENCY DEPARTMENT Provider Note   CSN: [id number] Arrival date & time: 10/26/17  0725     History   Chief Complaint Chief Complaint  Patient presents with  . Dizziness    HPI [name] [name] is a 84 y.o. male.  HPI  84 year old male presents with dizziness.  Dizziness started this morning when he first awoke at around 330 or 4.  He went to the bathroom and was noted to be a little off balance.  When he tried to get up a couple hours later to go take a shower he was even more dizzy and fell backwards onto the bed.  He did not injure himself.  There is no headache.  He does feel little bit nauseated.  He states he was last normal last night around 1130 when he went to bed.  No blurry vision or focal weakness.  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  He denies any chest pain or shortness of breath but has had a cough with some white sputum for about 3 days.  No fevers.  He denies any unilateral weakness.  He has been having trouble urinating for the last 3 days.  It does not hurt but he feels like minimal comes out.  He also has chronic constipation.  Past Medical History:  Diagnosis Date  . Arthritis   . Diabetes mellitus   . Heart murmur   . Hypertension   . Hypothyroid   . MDS (myelodysplastic syndrome) ([place]) 12/2006    hyperplastic on bone marrow bx 2008  . NSTEMI (non-ST elevation myocardial infarction) ([place]) 06/2013   demand ischemia in setting of sepsis  . Prostate cancer [facility]) 1998 dx    Patient Active Problem List   Diagnosis Date Noted  . Pancytopenia, acquired ([place]) 08/03/2015  . Arm bruise 07/23/2015  . Routine general medical examination at a health care facility 05/01/2015  . CAP (community acquired pneumonia) 04/10/2015  . NSTEMI (non-ST elevated myocardial infarction) ([place]) 06/06/2013  . Chronic constipation   . Dizziness 01/14/2012  . Ataxia 01/14/2012  . B12 deficiency 01/14/2012  . Anxiety and depression 12/17/2011  .  Hypertension   . Hypothyroid   . Prostate cancer ([place])   . Osteoarthritis   . 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 MDS (myelodysplastic syndrome) ([place]) 12/08/2006    Past Surgical History:  Procedure Laterality Date  . APPENDECTOMY    . TOTAL KNEE ARTHROPLASTY  2005   left        Home Medications    Prior to Admission medications   Medication Sig Start Date End Date Taking? Authorizing Provider  Docusate Calcium (STOOL SOFTENER PO) Take 2 tablets by mouth daily as needed.   Yes [provider]  levothyroxine (SYNTHROID, LEVOTHROID) 125 MCG tablet Take 1 tablet (125 mcg total) by mouth daily. 10/31/14  Yes Rowe Clack, MD    Family History Family History  Problem Relation Age of Onset  . Prostate cancer Father 30    Social History Social History   Tobacco Use  . Smoking status: Never Smoker  . Smokeless tobacco: Never Used  Substance Use Topics  . Alcohol use: No  . Drug use: No     Allergies   Statins   Review of Systems Review of Systems  Constitutional: Negative for fever.  Eyes: Negative for visual disturbance.  Respiratory: Positive for cough. Negative for shortness of breath.   Cardiovascular: Negative for chest pain.  Gastrointestinal: Positive for constipation. Negative for abdominal pain.  Genitourinary: Positive for decreased urine volume and difficulty urinating.  Neurological: Positive for dizziness. Negative for weakness and headaches.  All other systems reviewed and are negative.    Physical Exam Updated Vital Signs BP (!) 154/71   Pulse 71   Temp (!) 97.5 F (36.4 C)   Resp (!) 22   SpO2 99%   Physical Exam  Constitutional: He is oriented to person, place, and time. He appears well-developed and well-nourished. No distress.  HENT:  Head: Normocephalic and atraumatic.  Right Ear: External ear normal.  Left Ear: External ear normal.  Nose: Nose normal.  Eyes: Pupils are equal,  round, and reactive to light. EOM are normal. Right eye exhibits no discharge. Left eye exhibits no discharge. Right eye exhibits no nystagmus. Left eye exhibits no nystagmus.  Neck: Neck supple.  Cardiovascular: Normal rate, regular rhythm and normal heart sounds.  Pulmonary/Chest: Effort normal.  Mild bibasilar rales  Abdominal: Soft. There is no tenderness.  Musculoskeletal: He exhibits no edema.  Neurological: He is alert and oriented to person, place, and time.  CN 3-12 grossly intact. 5/5 strength in all 4 extremities. Grossly normal sensation. Finger to nose difficult due to chronic trouble lifting his shoulders  Skin: Skin is warm and dry. He is not diaphoretic.  Nursing note and vitals reviewed.    ED Treatments / Results  Labs (all labs ordered are listed, but only abnormal results are displayed) Labs Reviewed  CBC - Abnormal; Notable for the following components:      Result Value   WBC 3.0 (*)    RBC 3.97 (*)    Hemoglobin 11.4 (*)    HCT 34.8 (*)    Platelets 91 (*)    All other components within normal limits  COMPREHENSIVE METABOLIC PANEL - Abnormal; Notable for the following components:   Chloride 112 (*)    Glucose, Bld 137 (*)    Calcium 8.8 (*)    Total Protein 6.0 (*)    Albumin 3.3 (*)    AST 14 (*)    GFR calc non Af Amer 57 (*)    All other components within normal limits  URINALYSIS, ROUTINE W REFLEX MICROSCOPIC - Abnormal; Notable for the following components:   APPearance HAZY (*)    Hgb urine dipstick SMALL (*)    All other components within normal limits  I-STAT CHEM 8, ED - Abnormal; Notable for the following components:   Glucose, Bld 132 (*)    Hemoglobin 10.9 (*)    HCT 32.0 (*)    All other components within normal limits  ETHANOL  PROTIME-INR  APTT  DIFFERENTIAL  RAPID URINE DRUG SCREEN, HOSP PERFORMED  I-STAT TROPONIN, ED    EKG EKG Interpretation  Date/Time:  Sunday October 26 2017 07:29:34 EDT Ventricular Rate:  66 PR  Interval:    QRS Duration: 155 QT Interval:  456 QTC Calculation: 478 R Axis:   -33 Text Interpretation:  Sinus rhythm Prolonged PR interval Right bundle branch block no significant change since Sept 2018 Confirmed by Sherwood Gambler [phone]) on 10/26/2017 7:35:54 AM Also confirmed by Sherwood Gambler [phone]), editor Lynder Parents [phone])  on 10/26/2017 8:59:53 AM   Radiology Dg Chest 2 View  Result Date: 10/26/2017 CLINICAL DATA:  Dizziness, cough EXAM: CHEST - 2 VIEW COMPARISON:  01/03/2017 FINDINGS: The heart size and mediastinal contours are within normal limits. Both lungs are clear. The visualized skeletal structures are unremarkable. Trachea is  midline. Degenerative changes of the spine and shoulders. Stable ossified density over the left fourth anterior rib, suspect bone island. Monitor leads overlie the chest. IMPRESSION: No active cardiopulmonary disease. Electronically Signed   By: Jerilynn Mages.  Shick M.D.   On: 10/26/2017 08:27   Ct Head Wo Contrast  Result Date: 10/26/2017 CLINICAL DATA:  Dizziness since 4 a.m. today. EXAM: CT HEAD WITHOUT CONTRAST TECHNIQUE: Contiguous axial images were obtained from the base of the skull through the vertex without intravenous contrast. COMPARISON:  12/11/2016 FINDINGS: Brain: Diffusely enlarged ventricles and subarachnoid spaces. Patchy white matter low density in both cerebral hemispheres. No intracranial hemorrhage, mass lesion or CT evidence of acute infarction. Vascular: No hyperdense vessel or unexpected calcification. Skull: Mildly limited by motion artifacts.  No visible abnormality. Sinuses/Orbits: Mildly limited by motion artifacts. Status post bilateral cataract extraction. Grossly normal paranasal sinuses. Other: None. IMPRESSION: 1. Mildly limited examination due to patient motion with no acute abnormality seen. 2. Stable atrophy and chronic small vessel white matter ischemic changes. Electronically Signed   By: Claudie Revering M.D.   On: 10/26/2017 09:27    Mr Brain Wo Contrast  Result Date: 10/26/2017 CLINICAL DATA:  Altered level of consciousness.  Dizziness. EXAM: MRI HEAD WITHOUT CONTRAST TECHNIQUE: Multiplanar, multiecho pulse sequences of the brain and surrounding structures were obtained without intravenous contrast. COMPARISON:  Head CT 10/26/2017 and MRI 04/10/2015 FINDINGS: Brain: There is no evidence of acute infarct, intracranial hemorrhage, mass, midline shift, or extra-axial fluid collection. Generalized cerebral atrophy is unchanged from the prior MRI and likely within normal limits for age. Periventricular white matter T2 hyperintensities are also unchanged and nonspecific but compatible with minimal chronic small vessel ischemic disease, also not greater than expected for age. Mildly asymmetric ex axial CSF lateral to the left cerebellum is unchanged and without mass effect. Vascular: Major intracranial vascular flow voids are preserved. Skull and upper cervical spine: Unremarkable bone marrow signal. Cervical disc and facet degeneration with herniated disc material and/or posterior longitudinal ligament thickening resulting in spinal stenosis at C3-4 and C4-5 without evidence of cord compression. Sinuses/Orbits: Bilateral cataract extraction. Paranasal sinuses and mastoid air cells are clear. Other: None. IMPRESSION: Largely unremarkable appearance of the brain for age without acute intracranial abnormality. Electronically Signed   By: Logan Bores M.D.   On: 10/26/2017 14:22    Procedures Procedures (including critical care time)  Medications Ordered in ED Medications  sodium chloride 0.9 % bolus 500 mL (0 mLs Intravenous Stopped 10/26/17 1056)  meclizine (ANTIVERT) tablet 12.5 mg (12.5 mg Oral Given 10/26/17 1342)     Initial Impression / Assessment and Plan / ED Course  I have reviewed the triage vital signs and the nursing notes.  Pertinent labs & imaging results that were available during my care of the patient were reviewed  by me and considered in my medical decision making (see chart for details).     Patient's dizziness is nonspecific and he has a hard time describing it.  However given his age and other risk factors, MRI and stroke work-up obtained.  This is negative.  He was given a small dose of Antivert and IV fluids but is still dizzy to the point that he cannot even stand on his own.  Given he lives by himself I do not think he is stable to go home.  He was also noted to have acute urinary retention and was unable to urinate despite over 300 cc in his bladder.  Foley catheter placed.  Otherwise this could just be a peripheral vertigo versus orthostatic hypotension.  Dr. Evangeline Gula will admit for supportive care and monitoring.  Final Clinical Impressions(s) / ED Diagnoses   Final diagnoses:  Dizziness  Acute urinary retention    ED Discharge Orders    None       Sherwood Gambler, MD 10/26/17 1609

## 2017-10-26 NOTE — ED Notes (Signed)
Patient transported to MRI 

## 2017-10-26 NOTE — ED Notes (Signed)
Pt unable to provide sample of urine at this time. Urinal is at bedside. Will try again later.

## 2017-10-26 NOTE — ED Triage Notes (Signed)
Per EMS: pt from home with c/o dizziness since 0400 this am.  Pt woke to go to the restroom and felt dizziness.  No LOC/Syncope.  Pt denies shob, cp, and fever.   EMS vitals: 152/90 bp, 69 pulse, 18 rr, 153 CBG.

## 2017-10-26 NOTE — ED Notes (Signed)
During orthostatics pt got dizzy when he tried to stand. Pt was unable to stand for orthostatics.

## 2017-10-26 NOTE — ED Notes (Signed)
Attempted report x1. 

## 2017-10-26 NOTE — H&P (Signed)
History and Physical    [name] [name] YIR:[id number] DOB: Nov 20, 1917 DOA: 10/26/2017  PCP: Hoyt Koch, MD   Patient coming from: home  I have personally briefly reviewed patient's old medical records in [place]  Chief Complaint: dizziness upon awakening this am  HPI: [name] [name] is a 84 y.o. male with medical history significant of arthritis, diabetes mellitus, hypertension, myelodysplastic syndrome, coronary artery disease and prostate cancer who at baseline is very functional and lives in assisted living facility.  He came to the emergency department due to dizziness started this morning when he awoke around 330 or 4 AM.  He went to the bathroom and noted that he was a little off balance.  When he try to get up a couple of hours later to take a shower he felt even more dizzy and fell backwards onto the bed.  There was no associated injury.  He has no headache.  He feels a little nauseated.  He says he was last normal last night around 11:30 PM when he went to bed.  He denies any blurry vision or focal weaknesses.  He is very hard of hearing.  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  He has no chest pain or shortness of breath he has had a cough with some white sputum for about 3 days.  He denies any fever.  He has no unilateral weakness.  He is having trouble urinating for the past 3 days in the emergency department was found to have a 350 mL residual in his bladder.  Having dribbling urine output.  Early catheter was placed due to urinary retention.  Even his dizziness Flomax was not started.  Still has a history of chronic constipation.  CT scan and MRI of the brain were obtained and found no acute infarction.  Was felt that the patient has some benign positional vertigo.  He tried to sit and stand him up so that he could ambulate and they could check orthostatics on 2 occasions and on both occasions the patient was unable to stand due to his dizziness.  He was  given IV fluids and Antivert but he cannot go home in this condition.  Be admitted into the hospital for further evaluation and management.   Review of Systems: As per HPI otherwise all other systems reviewed and  negative.    Past Medical History:  Diagnosis Date  . Arthritis   . Diabetes mellitus   . Heart murmur   . Hypertension   . Hypothyroid   . MDS (myelodysplastic syndrome) ([place]) 12/2006    hyperplastic on bone marrow bx 2008  . NSTEMI (non-ST elevation myocardial infarction) ([place]) 06/2013   demand ischemia in setting of sepsis  . Prostate cancer ([place]) 1998 dx    Past Surgical History:  Procedure Laterality Date  . APPENDECTOMY    . TOTAL KNEE ARTHROPLASTY  2005   left    Social History   Social History Narrative   Living in ALF since SNF DC spring 2015    Daughter provides supportive care     reports that he has never smoked. He has never used smokeless tobacco. He reports that he does not drink alcohol or use drugs.  Allergies  Allergen Reactions  . Statins Other (See Comments)    Reaction:  Unknown     Family History  Problem Relation Age of Onset  . Prostate cancer Father 47     Prior to Admission medications  Medication Sig Start Date End Date Taking? Authorizing Provider  Docusate Calcium (STOOL SOFTENER PO) Take 2 tablets by mouth daily as needed.   Yes [provider]  levothyroxine (SYNTHROID, LEVOTHROID) 125 MCG tablet Take 1 tablet (125 mcg total) by mouth daily. 10/31/14  Yes Rowe Clack, MD    Physical Exam:  Constitutional: NAD, calm, comfortable, lying on the stretcher in the semi-Fowlers position Vitals:   10/26/17 1340 10/26/17 1341 10/26/17 1345 10/26/17 1400  BP:    (!) 154/71  Pulse: (!) 54 60 64 71  Resp: 12 15 19  (!) 22  Temp:      TempSrc:      SpO2: 100% 99% 100% 99%   Eyes: PERRL, lids and conjunctivae normal ENMT: Mucous membranes are moist. Posterior pharynx clear of any exudate or lesions.Normal  dentition.  Neck: normal, supple, no masses, no thyromegaly Respiratory: clear to auscultation bilaterally, no wheezing, no crackles. Normal respiratory effort. No accessory muscle use.  Cardiovascular: Regular rate and rhythm, no murmurs / rubs / gallops. No extremity edema. 2+ pedal pulses. No carotid bruits.  Abdomen: no tenderness, no masses palpated. No hepatosplenomegaly. Bowel sounds positive.  Musculoskeletal: no clubbing / cyanosis. No joint deformity upper and lower extremities. Good ROM, no contractures. Normal muscle tone.  Skin: no rashes, lesions, ulcers. No induration Neurologic: CN 2-12 grossly intact. Sensation intact, DTR normal. Strength 5/5 in all 4.  No nystagmus Psychiatric: Normal judgment and insight. Alert and oriented x 3. Normal mood.    Labs on Admission: I have personally reviewed following labs and imaging studies  CBC: Recent Labs  Lab 10/26/17 0859 10/26/17 0929  WBC 3.0*  --   NEUTROABS 1.7  --   HGB 11.4* 10.9*  HCT 34.8* 32.0*  MCV 87.7  --   PLT 91*  --    Basic Metabolic Panel: Recent Labs  Lab 10/26/17 0859 10/26/17 0929  NA 144 143  K 4.1 4.1  CL 112* 107  CO2 26  --   GLUCOSE 137* 132*  BUN 15 17  CREATININE 1.05 1.00  CALCIUM 8.8*  --    Liver Function Tests: Recent Labs  Lab 10/26/17 0859  AST 14*  ALT 10  ALKPHOS 77  BILITOT 0.7  PROT 6.0*  ALBUMIN 3.3*   Coagulation Profile: Recent Labs  Lab 10/26/17 0859  INR 1.12   Urine analysis:    Component Value Date/Time   COLORURINE YELLOW 10/26/2017 1105   APPEARANCEUR HAZY (A) 10/26/2017 1105   LABSPEC 1.017 10/26/2017 1105   PHURINE 5.0 10/26/2017 1105   GLUCOSEU NEGATIVE 10/26/2017 1105   GLUCOSEU 100 (A) 06/03/2014 1500   HGBUR SMALL (A) 10/26/2017 1105   BILIRUBINUR NEGATIVE 10/26/2017 1105   KETONESUR NEGATIVE 10/26/2017 1105   PROTEINUR NEGATIVE 10/26/2017 1105   UROBILINOGEN 0.2 06/03/2014 1500   NITRITE NEGATIVE 10/26/2017 1105   LEUKOCYTESUR NEGATIVE  10/26/2017 1105    Radiological Exams on Admission: Dg Chest 2 View  Result Date: 10/26/2017 CLINICAL DATA:  Dizziness, cough EXAM: CHEST - 2 VIEW COMPARISON:  01/03/2017 FINDINGS: The heart size and mediastinal contours are within normal limits. Both lungs are clear. The visualized skeletal structures are unremarkable. Trachea is midline. Degenerative changes of the spine and shoulders. Stable ossified density over the left fourth anterior rib, suspect bone island. Monitor leads overlie the chest. IMPRESSION: No active cardiopulmonary disease. Electronically Signed   By: Jerilynn Mages.  Shick M.D.   On: 10/26/2017 08:27   Ct Head Wo Contrast  Result  Date: 10/26/2017 CLINICAL DATA:  Dizziness since 4 a.m. today. EXAM: CT HEAD WITHOUT CONTRAST TECHNIQUE: Contiguous axial images were obtained from the base of the skull through the vertex without intravenous contrast. COMPARISON:  12/11/2016 FINDINGS: Brain: Diffusely enlarged ventricles and subarachnoid spaces. Patchy white matter low density in both cerebral hemispheres. No intracranial hemorrhage, mass lesion or CT evidence of acute infarction. Vascular: No hyperdense vessel or unexpected calcification. Skull: Mildly limited by motion artifacts.  No visible abnormality. Sinuses/Orbits: Mildly limited by motion artifacts. Status post bilateral cataract extraction. Grossly normal paranasal sinuses. Other: None. IMPRESSION: 1. Mildly limited examination due to patient motion with no acute abnormality seen. 2. Stable atrophy and chronic small vessel white matter ischemic changes. Electronically Signed   By: Claudie Revering M.D.   On: 10/26/2017 09:27   Mr Brain Wo Contrast  Result Date: 10/26/2017 CLINICAL DATA:  Altered level of consciousness.  Dizziness. EXAM: MRI HEAD WITHOUT CONTRAST TECHNIQUE: Multiplanar, multiecho pulse sequences of the brain and surrounding structures were obtained without intravenous contrast. COMPARISON:  Head CT 10/26/2017 and MRI 04/10/2015  FINDINGS: Brain: There is no evidence of acute infarct, intracranial hemorrhage, mass, midline shift, or extra-axial fluid collection. Generalized cerebral atrophy is unchanged from the prior MRI and likely within normal limits for age. Periventricular white matter T2 hyperintensities are also unchanged and nonspecific but compatible with minimal chronic small vessel ischemic disease, also not greater than expected for age. Mildly asymmetric ex axial CSF lateral to the left cerebellum is unchanged and without mass effect. Vascular: Major intracranial vascular flow voids are preserved. Skull and upper cervical spine: Unremarkable bone marrow signal. Cervical disc and facet degeneration with herniated disc material and/or posterior longitudinal ligament thickening resulting in spinal stenosis at C3-4 and C4-5 without evidence of cord compression. Sinuses/Orbits: Bilateral cataract extraction. Paranasal sinuses and mastoid air cells are clear. Other: None. IMPRESSION: Largely unremarkable appearance of the brain for age without acute intracranial abnormality. Electronically Signed   By: Logan Bores M.D.   On: 10/26/2017 14:22    EKG: Independently reviewed.  EKG shows sinus rhythm and prolonged PR with right bundle branch block no significant change from previous.  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  Vertigo: Patient will be admitted into the hospital.  We will place him in overnight observation.  We will give him some IV fluids.  We will hold further treatment with meclizine at this point.  Patient is unable to stand up and ambulate due to his severity of his dizziness.  Therefore we will get physical therapy to see the patient for possible evaluation for benign positional vertigo with the  Epley maneuvers.  2.  Hypertension: Currently patient is not on any medications.  We will start very low-dose ACE inhibitor and monitor closely.  His renal function is normal.  3.  Hypothyroidism: Continue levothyroxine.  4.  Prostate cancer: This is remote however patient is having urinary retention.  A Foley catheter had to be placed.  5.  Urinary retention Foley catheter is now in place.  Patient will likely discharge home with this.  We are unable to start Flomax given his dizziness.  He will need an outpatient appointment with a urologist.  6.  Osteoarthritis: Noted continue as needed Tylenol.  7.  Controlled diabetes mellitus with neurological  manifestations: We will check fingerstick blood glucoses q. a.m. if elevated consider sliding scale insulin coverage.  8.  Myelodysplastic syndrome noted currently patient with pancytopenia associated with that.    DVT prophylaxis: Lovenox Code Status: DNR Family Communication: No family present at the time of admission. Disposition Plan: Likely home in 24 hours Consults called: Physical therapy Admission status: Observation   Lady Deutscher MD FACP [facility] Pager [phone]  If 7PM-7AM, please contact night-coverage [email or web address] Password [facility]  10/26/2017, 4:18 PM

## 2017-10-27 DIAGNOSIS — R42 Dizziness and giddiness: Secondary | ICD-10-CM | POA: Diagnosis not present

## 2017-10-27 DIAGNOSIS — E039 Hypothyroidism, unspecified: Secondary | ICD-10-CM

## 2017-10-27 DIAGNOSIS — E1141 Type 2 diabetes mellitus with diabetic mononeuropathy: Secondary | ICD-10-CM | POA: Diagnosis not present

## 2017-10-27 LAB — BASIC METABOLIC PANEL
Anion gap: 7 (ref 5–15)
BUN: 12 mg/dL (ref 8–23)
CO2: 25 mmol/L (ref 22–32)
CREATININE: 0.95 mg/dL (ref 0.61–1.24)
Calcium: 8.4 mg/dL — ABNORMAL LOW (ref 8.9–10.3)
Chloride: 108 mmol/L (ref 98–111)
GFR calc Af Amer: >60 mL/min (ref >60)
GFR calc non Af Amer: >60 mL/min (ref >60)
Glucose, Bld: 96 mg/dL (ref 70–99)
Potassium: 3.8 mmol/L (ref 3.5–5.1)
Sodium: 140 mmol/L (ref 135–145)

## 2017-10-27 LAB — CBC
HCT: 34.1 % — ABNORMAL LOW (ref 39.0–52.0)
HEMOGLOBIN: 11.4 g/dL — AB (ref 13.0–17.0)
MCH: 28.7 pg (ref 26.0–34.0)
MCHC: 33.4 g/dL (ref 30.0–36.0)
MCV: 85.9 fL (ref 78.0–100.0)
Platelets: 155 10*3/uL (ref 150–400)
RBC: 3.97 MIL/uL — ABNORMAL LOW (ref 4.22–5.81)
RDW: 13.6 % (ref 11.5–15.5)
WBC: 3 10*3/uL — ABNORMAL LOW (ref 4.0–10.5)

## 2017-10-27 LAB — TSH: TSH: 0.585 u[IU]/mL (ref 0.350–4.500)

## 2017-10-27 NOTE — Evaluation (Signed)
Occupational Therapy Evaluation Patient Details Name: KALIEB FREELAND MRN: [id number] DOB: 07/31/1917 Today's Date: 10/27/2017    History of Present Illness 84 y.o. male admitted on 10/26/17 for dizziness/weakness. MRI shows no acute events.  Foley catheter inserted as he was found to be retaining urine.  Pt with significant PMH of prostate CA, NSTEMI, MDS, HTN, DM, and TKA L.   Clinical Impression   PTA, pt was living alone and reports modified independence with basic ADL. Per chart and PT, pt has assistance from daughter for transportation and external assistance for home management (heavy tasks). Pt with some diminished awareness of safety at this time. He required min guard to min assist for toilet transfers, min guard assistance for LB ADL, and supervision for standing grooming tasks. Pt would benefit from continued OT services while admitted to improve independence and safety with ADL and fucntional mobility. Recommend short-term SNF level rehabilitation to maximize safety and return to PLOF before returning home.     Follow Up Recommendations  SNF;Supervision/Assistance - 24 hour    Equipment Recommendations  None recommended by OT    Recommendations for Other Services       Precautions / Restrictions Precautions Precautions: Fall Precaution Comments: pt reports falls too neumerous to count at home, daughter confirms he falls frequently.  Restrictions Weight Bearing Restrictions: No      Mobility Bed Mobility Overal bed mobility: Needs Assistance Bed Mobility: Supine to Sit;Sit to Supine     Supine to sit: Min guard Sit to supine: Min guard   General bed mobility comments: Min guard assist for stability.   Transfers Overall transfer level: Needs assistance Equipment used: Rolling walker (2 wheeled) Transfers: Sit to/from Stand Sit to Stand: Min guard         General transfer comment: Min guard assist to power up to standing.     Balance Overall balance  assessment: Needs assistance Sitting-balance support: Feet supported;No upper extremity supported;Bilateral upper extremity supported Sitting balance-Leahy Scale: Fair     Standing balance support: Bilateral upper extremity supported;Single extremity supported Standing balance-Leahy Scale: Fair Standing balance comment: Relies on B UE support for dynamic tasks. Can statically stand without UE support.                            ADL either performed or assessed with clinical judgement   ADL Overall ADL's : Needs assistance/impaired Eating/Feeding: Set up;Sitting   Grooming: Supervision/safety;Standing   Upper Body Bathing: Set up;Sitting   Lower Body Bathing: Min guard;Sit to/from stand   Upper Body Dressing : Set up;Sitting   Lower Body Dressing: Min guard;Sit to/from stand   Toilet Transfer: Min guard;Minimal assistance;Ambulation;RW Toilet Transfer Details (indicate cue type and reason): On one occasion with LOB requiring min assist from OT to correct.  Toileting- Water quality scientist and Hygiene: Min guard;Sit to/from stand       Functional mobility during ADLs: Min guard;Rolling walker General ADL Comments: Min guard assist for safety. Pt unstable and rushes through tasks.      Vision Patient Visual Report: No change from baseline Vision Assessment?: Yes Eye Alignment: Within Functional Limits Ocular Range of Motion: Within Functional Limits Alignment/Gaze Preference: Within Defined Limits Tracking/Visual Pursuits: Able to track stimulus in all quads without difficulty Visual Fields: No apparent deficits     Perception     Praxis      Pertinent Vitals/Pain Pain Assessment: No/denies pain     Hand Dominance Right  Extremity/Trunk Assessment Upper Extremity Assessment Upper Extremity Assessment: RUE deficits/detail;LUE deficits/detail RUE Deficits / Details: Limited shoulder flexion 0-60 degrees. Grinding at [place] joint noted with PROM.  LUE  Deficits / Details: Intermittent limitation of shoulder flexion. Able to complete 0-120 AROM and WNL PROM.   Lower Extremity Assessment Lower Extremity Assessment: Generalized weakness       Communication Communication Communication: HOH   Cognition Arousal/Alertness: Awake/alert Behavior During Therapy: WFL for tasks assessed/performed Overall Cognitive Status: History of cognitive impairments - at baseline                                 General Comments: Pt reporting he is in [place] and then in [place]. Did not know the date and reporting that the month was January. He was able to report why he is here.    General Comments       Exercises     Shoulder Instructions      Home Living Family/patient expects to be discharged to:: Private residence Living Arrangements: Alone Available Help at Discharge: ([employer] comes to assist with cleaning/housework) Type of Home: House Home Access: Level entry     Home Layout: One level     Bathroom Shower/Tub: Occupational psychologist: Standard     Home Equipment: Environmental consultant - 4 wheels;Shower seat;Grab bars - toilet          Prior Functioning/Environment Level of Independence: Needs assistance  Gait / Transfers Assistance Needed: Pt walks in house with and without RW; has assistance for transportation but never walks ADL's / Homemaking Assistance Needed: Per PT "needs help with heavy cleaning, does some light cooking, but this concerns his daughter."  Communication / Swallowing Assistance Needed: HOH, right ear is better, does not wear hearing aids.           OT Problem List: Decreased strength;Decreased range of motion;Decreased activity tolerance;Impaired balance (sitting and/or standing);Decreased safety awareness;Decreased knowledge of use of DME or AE;Decreased knowledge of precautions;Impaired UE functional use      OT Treatment/Interventions: Self-care/ADL training;Therapeutic exercise;Energy  conservation;DME and/or AE instruction;Therapeutic activities;Patient/family education;Balance training    OT Goals(Current goals can be found in the care plan section) Acute Rehab OT Goals Patient Stated Goal: "to go home" OT Goal Formulation: With patient Time For Goal Achievement: 11/10/17 Potential to Achieve Goals: Good ADL Goals Pt Will Perform Grooming: with modified independence;standing Pt Will Perform Lower Body Dressing: with modified independence;sit to/from stand Pt Will Transfer to Toilet: with modified independence;ambulating;regular height toilet;grab bars Pt Will Perform Toileting - Clothing Manipulation and hygiene: with modified independence;sit to/from stand Pt Will Perform Tub/Shower Transfer: with modified independence;rolling walker  OT Frequency: Min 2X/week   Barriers to D/C:            Co-evaluation              AM-PAC PT "6 Clicks" Daily Activity     Outcome Measure Help from another person eating meals?: None Help from another person taking care of personal grooming?: A Little Help from another person toileting, which includes using toliet, bedpan, or urinal?: A Little Help from another person bathing (including washing, rinsing, drying)?: A Little Help from another person to put on and taking off regular upper body clothing?: None Help from another person to put on and taking off regular lower body clothing?: A Little 6 Click Score: 20   End of Session Equipment Utilized During  Treatment: Gait belt;Rolling walker Nurse Communication: Mobility status  Activity Tolerance: Patient tolerated treatment well Patient left: in bed;with call bell/phone within reach;with bed alarm set  OT Visit Diagnosis: Other abnormalities of gait and mobility (R26.89);Muscle weakness (generalized) (M62.81)                Time: 2956-2130 OT Time Calculation (min): 22 min Charges:  OT General Charges $OT Visit: 1 Visit OT Evaluation $OT Eval Moderate Complexity: 1  Mod G-Codes:     Norman Herrlich, MS OTR/L  Pager: [place] [facility] [place] 10/27/2017, 5:39 PM

## 2017-10-27 NOTE — Care Management Note (Addendum)
Case Management Note  Patient Details  Name: [name] [name] MRN: [id number] Date of Birth: 04-08-1918  Subjective/Objective:       Presents with dizziness. From home alone. Independent with ADL's , no DME: usage. Hx of falls per daughter, [facility] [employer]/ PCS, 2 x a week / 2hrs, cleaning and cooks for pt.       Juliet Rude (Daughter)       [phone]              PCP: [facility], Dr. Arletha Grippe,  Fax # [phone]    NCM placed [facility] to [facility]/ [phone], ext. [phone] to notify of pt's current visit to hospital.  Voice message left per NCM.   12:14 pm  NCM received call from PT/ Becca . Becca informed NCM after evaluating pt , recommendations for SNF placement will be needed.  NCM made CSW aware.  11:36 am NCM received call back from [place]. Pt's SW is Ralene Bathe , [phone], ext. [phone], pager # [phone]  Foley placed this hospital visit 2/2 urinary retention  Action/Plan: PT evaluation pending .... NCM to f/u with disposition needs.  Expected Discharge Date:  10/27/17               Expected Discharge Plan:  (resides alone)  In-House Referral:     Discharge planning Services  CM Consult  Post Acute Care Choice:    Choice offered to:     DME Arranged:    DME Agency:     HH Arranged:   RN,PT,NA [place] Agency:    [facility]  Status of Service:  In process, will continue to follow  If discussed at Long Length of Stay Meetings, dates discussed:    Additional Comments:  Sharin Mons, RN 10/27/2017, 10:53 AM

## 2017-10-27 NOTE — Progress Notes (Signed)
CSW received consult regarding [facility] placement since patient does not qualify to go through Medicare (observation status). CSW faxed in referral to see if patient is service connected.   Percell Locus Benedicto Capozzi LCSW [phone]

## 2017-10-27 NOTE — Plan of Care (Signed)
  Problem: Clinical Measurements: Goal: Will remain free from infection Outcome: Progressing   Problem: Activity: Goal: Risk for activity intolerance will decrease Note:  PT evaluation    Problem: Nutrition: Goal: Adequate nutrition will be maintained Note:  Eating with Iv fluids infusing   Problem: Elimination: Goal: Will not experience complications related to bowel motility Note:  No bowel over night Goal: Will not experience complications related to urinary retention Note:  Patient has Foley for inserted day 2 plan to keep in at discharge d/t acute rentention.    Problem: Elimination: Goal: Will not experience complications related to bowel motility Note:  No bowel over night Goal: Will not experience complications related to urinary retention Note:  Patient has Foley for inserted day 2 plan to keep in at discharge d/t acute rentention.    Problem: Elimination: Goal: Will not experience complications related to urinary retention Note:  Patient has Foley for inserted day 2 plan to keep in at discharge d/t acute rentention.

## 2017-10-27 NOTE — Evaluation (Signed)
Physical Therapy Evaluation/Vestibular Assessment Patient Details Name: [name] [name] MRN: [id number] DOB: 1917-11-06 Today's Date: 10/27/2017   History of Present Illness  84 y.o. male admittee on 10/26/17 for dizziness/weakness. MRI shows no acute events.  Foley catheter inserted as he was found to be retaining urine.  Pt with significant PMH of prostate CA, NSTEMI, MDS, HTN, DM, and TKA L.  Clinical Impression  Pt reports frequent, recurring falls at home. He is unsteady on his feet and reports he "sometimes" uses the RW.  He is better with the assistive device requiring lighter min assist when he complies with using it.  I spoke with his daughter on the phone who reports that he does fall (likely more than she knows about) and did well last time he went to SNF rehab and home with [facility] afterwards getting back on his feet and less unsteady.  I agree this would be beneficial for him to help decrease his fall risk after discharge.  His vestibular assessment was clear and did not reveal any vestibular dysfunction.  Daughter also reports that he has air conditioning, but never runs it.      Follow Up Recommendations SNF;Other (comment)(daughter requesting [facility])    Equipment Recommendations  None recommended by PT    Recommendations for Other Services   OT consult    Precautions / Restrictions Precautions Precautions: Fall Precaution Comments: pt reports falls too neumerous to count at home, daughter confirms he falls frequently.       Mobility  Bed Mobility Overal bed mobility: Needs Assistance Bed Mobility: Supine to Sit;Sit to Supine     Supine to sit: Min assist Sit to supine: Min assist   General bed mobility comments: Min assist to support trunk and untangle legs to get up to EOB,  min assist to help lift at least one leg back into bed to return to supine.    Transfers Overall transfer level: Needs assistance Equipment used: Rolling walker (2  wheeled) Transfers: Sit to/from Stand Sit to Stand: Min assist         General transfer comment: Min assist to support trunk to power up to standing.  Min assist to help control descent to sit on bed and in higher recliner.  Cues for safe RW use and hand placement.   Ambulation/Gait Ambulation/Gait assistance: Min assist Gait Distance (Feet): 150 Feet Assistive device: Rolling walker (2 wheeled) Gait Pattern/deviations: Step-through pattern;Shuffle;Trunk flexed;Staggering right;Staggering left     General Gait Details: Pt with shuffling staggering gait pattern, lighter min assist when he uses the RW, heavier min assit for balance when he insisted on walking around the room without the RW.           Balance Overall balance assessment: Needs assistance Sitting-balance support: Feet supported;No upper extremity supported;Bilateral upper extremity supported Sitting balance-Leahy Scale: Fair     Standing balance support: Bilateral upper extremity supported;Single extremity supported Standing balance-Leahy Scale: Poor         10/27/17 1223  Vestibular Assessment  General Observation Pt tipps his head to the right, has has his eyes examined in the past year, does not wear glasses, HOH with R ear hearing better than left, but this is known, no tinnitus or HA, he did have recent posterior head trauma after falling at home, negative MRI this admission, no fullness currently, frequent falls at home and cannot very well describe his dizziness (off balance), but reports having it for "at least 5 years"  Symptom Behavior  Type of Dizziness Imbalance  Frequency of Dizziness intermittent  Duration of Dizziness when up  Aggravating Factors Activity in general  Relieving Factors Lying supine  Occulomotor Exam  Occulomotor Alignment Normal  Spontaneous Absent  Gaze-induced Absent  Smooth Pursuits Saccades  Comment saccadic smooth persuits, test of skew (-)  Vestibulo-Occular Reflex   Comment HIT negative bil  Auditory  Comments R ear hears better than L, known at baseline  Positional Testing  Dix-Hallpike Dix-Hallpike Right;Dix-Hallpike Left  Horizontal Canal Testing Horizontal Canal Right;Horizontal Canal Left  Dix-Hallpike Right  Dix-Hallpike Right Duration 0  Dix-Hallpike Right Symptoms No nystagmus  Dix-Hallpike Left  Dix-Hallpike Left Duration 0  Dix-Hallpike Left Symptoms No nystagmus  Horizontal Canal Right  Horizontal Canal Right Duration 0  Horizontal Canal Right Symptoms Normal  Horizontal Canal Left  Horizontal Canal Left Duration 0  Horizontal Canal Left Symptoms Normal                            Pertinent Vitals/Pain Pain Assessment: No/denies pain    Home Living Family/patient expects to be discharged to:: Private residence Living Arrangements: Alone Available Help at Discharge: Family;Available PRN/intermittently;Personal care attendant([employer] comes M/Fri to help with cleaning/housework) Type of Home: House Home Access: Level entry     Home Layout: One level Home Equipment: Walker - 4 wheels;Shower seat      Prior Function Level of Independence: Needs assistance   Gait / Transfers Assistance Needed: pt walks inside the house both with and without a RW, per daughter he refuses to use an AD in the community and she holds him or her husband holds him to steady him to walk. He likes to go to church, but doesn't have much social interaction other than that.  He no longer drives.   ADL's / Homemaking Assistance Needed: needs help with heavy cleaning, does some light cooking, but this concerns his daughter.         Hand Dominance   Dominant Hand: Right    Extremity/Trunk Assessment   Upper Extremity Assessment Upper Extremity Assessment: RUE deficits/detail;LUE deficits/detail RUE Deficits / Details: pt cannot raise his right arm above 90 degrees, reports issues with it, needs surgery, but has not had any surgery yet.    LUE Deficits / Details: Pt can weakly raise his left arm above his head, but struggles to do so, he cannot resist.      Lower Extremity Assessment Lower Extremity Assessment: Generalized weakness(grossly at least 3/5, h/o L TKA)    Cervical / Trunk Assessment Cervical / Trunk Assessment: Kyphotic(tilts his head in side bend to the right)  Communication   Communication: HOH  Cognition Arousal/Alertness: Awake/alert Behavior During Therapy: WFL for tasks assessed/performed Overall Cognitive Status: History of cognitive impairments - at baseline                                 General Comments: Per daughter he has some very mild cognitive deficits, mostly dealing with numbers and memory.  He knew where he was today, and what month it was, he could not tell me the year or president.  He could tell me why he was here.               Assessment/Plan    PT Assessment Patient needs continued PT services  PT Problem List Decreased strength;Decreased activity tolerance;Decreased balance;Decreased mobility;Decreased cognition;Decreased knowledge of use  of DME       PT Treatment Interventions DME instruction;Stair training;Gait training;Functional mobility training;Therapeutic activities;Therapeutic exercise;Balance training;Patient/family education;Cognitive remediation    PT Goals (Current goals can be found in the Care Plan section)  Acute Rehab PT Goals Patient Stated Goal: to get better and go home PT Goal Formulation: With patient/family Time For Goal Achievement: 11/10/17 Potential to Achieve Goals: Good    Frequency Min 3X/week           AM-PAC PT "6 Clicks" Daily Activity  Outcome Measure Difficulty turning over in bed (including adjusting bedclothes, sheets and blankets)?: Unable Difficulty moving from lying on back to sitting on the side of the bed? : Unable Difficulty sitting down on and standing up from a chair with arms (e.g., wheelchair, bedside  commode, etc,.)?: Unable Help needed moving to and from a bed to chair (including a wheelchair)?: A Little Help needed walking in hospital room?: A Little Help needed climbing 3-5 steps with a railing? : A Little 6 Click Score: 12    End of Session   Activity Tolerance: Patient tolerated treatment well Patient left: in chair;with call bell/phone within reach;with chair alarm set;with nursing/sitter in room;Other (comment)(RN tech in room) Nurse Communication: Mobility status PT Visit Diagnosis: Unsteadiness on feet (R26.81);History of falling (Z91.81);Difficulty in walking, not elsewhere classified (R26.2);Muscle weakness (generalized) (M62.81)    Time: 2993-7169 PT Time Calculation (min) (ACUTE ONLY): 39 min   Charges:          Wells Guiles B. Kwali Wrinkle, PT, DPT [phone]   PT Evaluation $PT Eval Moderate Complexity: 1 Mod PT Treatments $Gait Training: 8-22 mins $Therapeutic Activity: 8-22 mins   10/27/2017, 12:20 PM

## 2017-10-27 NOTE — Progress Notes (Signed)
Progress Note    [name] [name]  QAS:[id number] DOB: 05/13/1917  DOA: 10/26/2017 PCP: No primary care provider on file.    Brief Narrative:     Medical records reviewed and are as summarized below:  [name] [name] is an 84 y.o. male with medical history significant of arthritis, diabetes mellitus, hypertension, myelodysplastic syndrome, coronary artery disease and prostate cancer who at baseline is very functional and lives in assisted living facility.  He came to the emergency department due to dizziness started this morning when he awoke around 330 or 4 AM.  He went to the bathroom and noted that he was a little off balance.  When he try to get up a couple of hours later to take a shower he felt even more dizzy and fell backwards onto the bed.  There was no associated injury.  He has no headache.  He feels a little nauseated.  He says he was last normal last night around 11:30 PM when he went to bed.  He denies any blurry vision or focal weaknesses.  He is very hard of hearing.  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  He has no chest pain or shortness of breath he has had a cough with some white sputum for about 3 days.  He denies any fever.  He has no unilateral weakness.  He is having trouble urinating for the past 3 days in the emergency department was found to have a 350 mL residual in his bladder.  Having dribbling urine output.  Early catheter was placed due to urinary retention.  Even his dizziness Flomax was not started.  Still has a history of chronic constipation.  CT scan and MRI of the brain were obtained and found no acute infarction.  Was felt that the patient has some benign positional vertigo.  He tried to sit and stand him up so that he could ambulate and they could check orthostatics on 2 occasions and on both occasions the patient was unable to stand due to his dizziness.  He was given IV fluids and Antivert but he cannot go home in this condition.  Be admitted into  the hospital for further evaluation and management.    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  - low-dose ACE inhibitor  Hypothyroidism: - Continue levothyroxine.   Prostate cancer:  -This is remote however patient is having urinary retention. -A Foley catheter had to be placed--- will need referral to urology for voiding trial   Urinary retention  -Foley catheter placed   -unable to start Flomax given his dizziness -outpatient appointment with a urologist.   Osteoarthritis: Noted continue as needed Tylenol.  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 Code Status: DNR Family Communication: none at bedside Disposition Plan: SNF if able-- [facility]   Medical Consultants:    None.   Subjective:   No further dizziness  Objective:    Vitals:   10/26/17 1630 10/26/17 1812 10/26/17 2131 10/27/17 0452  BP:  (!) 159/66 (!) 148/63 Marland Kitchen)  151/74  Pulse: 65 70 68 65  Resp: 13 15 17 17   Temp:  (!) 97.5 F (36.4 C) 98 F (36.7 C) 98.1 F (36.7 C)  TempSrc:  Oral Oral Oral  SpO2: 100% 100% 100% 97%  Weight:  56.2 kg (124 lb)    Height:  5\' 9"  (1.753 m)      Intake/Output Summary (Last 24 hours) at 10/27/2017 1406 Last data filed at 10/27/2017 1147 Gross per 24 hour  Intake 1072.66 ml  Output 2100 ml  Net -1027.34 ml   Filed Weights   10/26/17 1812  Weight: 56.2 kg (124 lb)    Exam: In bed, NAD- HOH rrr No increased work of breathing +BS,  soft Moves all 4 ext  Data Reviewed:   I have personally reviewed following labs and imaging studies:  Labs: Labs show the following:   Basic Metabolic Panel: Recent Labs  Lab 10/26/17 0859 10/26/17 0929 10/27/17 0439  NA 144 143 140  K 4.1 4.1 3.8  CL 112* 107 108  CO2 26  --  25  GLUCOSE 137* 132* 96  BUN 15 17 12   CREATININE 1.05 1.00 0.95  CALCIUM 8.8*  --  8.4*   GFR Estimated Creatinine Clearance: 34.5 mL/min (by C-G formula based on SCr of 0.95 mg/dL). Liver Function Tests: Recent Labs  Lab 10/26/17 0859  AST 14*  ALT 10  ALKPHOS 77  BILITOT 0.7  PROT 6.0*  ALBUMIN 3.3*   No results for input(s): LIPASE, AMYLASE in the last 168 hours. No results for input(s): AMMONIA in the last 168 hours. Coagulation profile Recent Labs  Lab 10/26/17 0859  INR 1.12    CBC: Recent Labs  Lab 10/26/17 0859 10/26/17 0929 10/27/17 0439  WBC 3.0*  --  3.0*  NEUTROABS 1.7  --   --   HGB 11.4* 10.9* 11.4*  HCT 34.8* 32.0* 34.1*  MCV 87.7  --  85.9  PLT 91*  --  155   Cardiac Enzymes: No results for input(s): CKTOTAL, CKMB, CKMBINDEX, TROPONINI in the last 168 hours. BNP (last 3 results) No results for input(s): PROBNP in the last 8760 hours. CBG: No results for input(s): GLUCAP in the last 168 hours. D-Dimer: No results for input(s): DDIMER in the last 72 hours. Hgb A1c: No results for input(s): HGBA1C in the last 72 hours. Lipid Profile: No results for input(s): CHOL, HDL, LDLCALC, TRIG, CHOLHDL, LDLDIRECT in the last 72 hours. Thyroid function studies: Recent Labs    10/27/17 1044  TSH 0.585   Anemia work up: No results for input(s): VITAMINB12, FOLATE, FERRITIN, TIBC, IRON, RETICCTPCT in the last 72 hours. Sepsis Labs: Recent Labs  Lab 10/26/17 0859 10/27/17 0439  WBC 3.0* 3.0*    Microbiology No results found for this or any previous visit (from the past 240 hour(s)).  Procedures and diagnostic studies:  Dg Chest 2 View  Result Date:  10/26/2017 CLINICAL DATA:  Dizziness, cough EXAM: CHEST - 2 VIEW COMPARISON:  01/03/2017 FINDINGS: The heart size and mediastinal contours are within normal limits. Both lungs are clear. The visualized skeletal structures are unremarkable. Trachea is midline. Degenerative changes of the spine and shoulders. Stable ossified density over the left fourth anterior rib, suspect bone island. Monitor leads overlie the chest. IMPRESSION: No active cardiopulmonary disease. Electronically Signed   By: Jerilynn Mages.  Shick M.D.   On: 10/26/2017 08:27   Ct Head Wo Contrast  Result Date: 10/26/2017 CLINICAL DATA:  Dizziness since 4 a.m. today. EXAM: CT  HEAD WITHOUT CONTRAST TECHNIQUE: Contiguous axial images were obtained from the base of the skull through the vertex without intravenous contrast. COMPARISON:  12/11/2016 FINDINGS: Brain: Diffusely enlarged ventricles and subarachnoid spaces. Patchy white matter low density in both cerebral hemispheres. No intracranial hemorrhage, mass lesion or CT evidence of acute infarction. Vascular: No hyperdense vessel or unexpected calcification. Skull: Mildly limited by motion artifacts.  No visible abnormality. Sinuses/Orbits: Mildly limited by motion artifacts. Status post bilateral cataract extraction. Grossly normal paranasal sinuses. Other: None. IMPRESSION: 1. Mildly limited examination due to patient motion with no acute abnormality seen. 2. Stable atrophy and chronic small vessel white matter ischemic changes. Electronically Signed   By: Claudie Revering M.D.   On: 10/26/2017 09:27   Mr Brain Wo Contrast  Result Date: 10/26/2017 CLINICAL DATA:  Altered level of consciousness.  Dizziness. EXAM: MRI HEAD WITHOUT CONTRAST TECHNIQUE: Multiplanar, multiecho pulse sequences of the brain and surrounding structures were obtained without intravenous contrast. COMPARISON:  Head CT 10/26/2017 and MRI 04/10/2015 FINDINGS: Brain: There is no evidence of acute infarct, intracranial hemorrhage, mass,  midline shift, or extra-axial fluid collection. Generalized cerebral atrophy is unchanged from the prior MRI and likely within normal limits for age. Periventricular white matter T2 hyperintensities are also unchanged and nonspecific but compatible with minimal chronic small vessel ischemic disease, also not greater than expected for age. Mildly asymmetric ex axial CSF lateral to the left cerebellum is unchanged and without mass effect. Vascular: Major intracranial vascular flow voids are preserved. Skull and upper cervical spine: Unremarkable bone marrow signal. Cervical disc and facet degeneration with herniated disc material and/or posterior longitudinal ligament thickening resulting in spinal stenosis at C3-4 and C4-5 without evidence of cord compression. Sinuses/Orbits: Bilateral cataract extraction. Paranasal sinuses and mastoid air cells are clear. Other: None. IMPRESSION: Largely unremarkable appearance of the brain for age without acute intracranial abnormality. Electronically Signed   By: Logan Bores M.D.   On: 10/26/2017 14:22    Medications:   . enoxaparin (LOVENOX) injection  40 mg Subcutaneous Q24H  . levothyroxine  125 mcg Oral QAC breakfast  . sodium chloride flush  3 mL Intravenous Q12H  . sodium chloride flush  3 mL Intravenous Q12H   Continuous Infusions: . sodium chloride       LOS: 0 days   Geradine Girt  [facility]   *Please refer to [email or web address], password [email or web address] to get updated schedule on who will round on this patient, as hospitalists switch teams weekly. If 7PM-7AM, please contact night-coverage at [email or web address], password [email or web address] for any overnight needs.  10/27/2017, 2:06 PM

## 2017-10-28 DIAGNOSIS — I10 Essential (primary) hypertension: Secondary | ICD-10-CM

## 2017-10-28 DIAGNOSIS — R338 Other retention of urine: Secondary | ICD-10-CM | POA: Diagnosis not present

## 2017-10-28 DIAGNOSIS — R42 Dizziness and giddiness: Secondary | ICD-10-CM | POA: Diagnosis not present

## 2017-10-28 NOTE — NC FL2 (Signed)
[place] LEVEL OF CARE SCREENING TOOL     IDENTIFICATION  Patient Name: [name] [name] Birthdate: 1917/12/15 Sex: male Admission Date (Current Location): 10/26/2017  [facility] and [place] Number:  Herbalist and Address:  [facility] [place], [place] [address], [place], [place] [address]      Provider Number: [id number]  Attending Physician Name and Address:  Geradine Girt, DO  Relative Name and Phone Number:  Tye [place], daughter, [phone]    Current Level of Care: Hospital Recommended Level of Care: [place] Prior Approval Number:    Date Approved/Denied:   PASRR Number: [id number]  Discharge Plan: SNF    Current Diagnoses: Patient Active Problem List   Diagnosis Date Noted  . Pancytopenia, acquired ([place]) 08/03/2015  . Arm bruise 07/23/2015  . Routine general medical examination at a health care facility 05/01/2015  . CAP (community acquired pneumonia) 04/10/2015  . NSTEMI (non-ST elevated myocardial infarction) ([place]) 06/06/2013  . Chronic constipation   . Vertigo 01/14/2012  . Ataxia 01/14/2012  . B12 deficiency 01/14/2012  . Anxiety and depression 12/17/2011  . Hypertension   . Hypothyroid   . Prostate cancer ([place])   . Osteoarthritis   . 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 MDS (myelodysplastic syndrome) ([place]) 12/08/2006    Orientation RESPIRATION BLADDER Height & Weight     Self, Situation, Place, Time  Normal Continent, Indwelling catheter Weight: 56.2 kg (124 lb) Height:  5\' 9"  (175.3 cm)  BEHAVIORAL SYMPTOMS/MOOD NEUROLOGICAL BOWEL NUTRITION STATUS      Continent Diet(Please see DC Summary)  AMBULATORY STATUS COMMUNICATION OF NEEDS Skin   Limited Assist Verbally Normal                       Personal Care Assistance Level of Assistance  Bathing, Feeding, Dressing Bathing Assistance: Limited assistance Feeding assistance: Independent Dressing  Assistance: Limited assistance     Functional Limitations Info  Sight, Hearing, Speech Sight Info: Adequate Hearing Info: Impaired Speech Info: Adequate    SPECIAL CARE FACTORS FREQUENCY  PT (By licensed PT), OT (By licensed OT)     PT Frequency: 5x/week OT Frequency: 3x/week            Contractures Contractures Info: Not present    Additional Factors Info  Code Status, Allergies Code Status Info: DNR Allergies Info: Statins           Current Medications (10/28/2017):  This is the current hospital active medication list Current Facility-Administered Medications  Medication Dose Route Frequency Provider Last Rate Last Dose  . 0.9 %  sodium chloride infusion  250 mL Intravenous PRN Lady Deutscher, MD      . acetaminophen (TYLENOL) tablet 650 mg  650 mg Oral Q6H PRN Lady Deutscher, MD       Or  . acetaminophen (TYLENOL) suppository 650 mg  650 mg Rectal Q6H PRN Lady Deutscher, MD      . docusate sodium (COLACE) capsule 100 mg  100 mg Oral BID PRN Lady Deutscher, MD      . enoxaparin (LOVENOX) injection 40 mg  40 mg Subcutaneous Q24H Lady Deutscher, MD   40 mg at 10/27/17 2228  . ketorolac (TORADOL) 15 MG/ML injection 15 mg  15 mg Intravenous Q6H PRN Lady Deutscher, MD      . levothyroxine (SYNTHROID, LEVOTHROID) tablet 125 mcg  125 mcg Oral QAC breakfast Lady Deutscher,  MD   125 mcg at 10/28/17 0754  . ondansetron (ZOFRAN) tablet 4 mg  4 mg Oral Q6H PRN Lady Deutscher, MD       Or  . ondansetron [facility]) injection 4 mg  4 mg Intravenous Q6H PRN Lady Deutscher, MD      . sodium chloride flush (NS) 0.9 % injection 3 mL  3 mL Intravenous Q12H Lady Deutscher, MD      . sodium chloride flush (NS) 0.9 % injection 3 mL  3 mL Intravenous Q12H Lady Deutscher, MD   3 mL at 10/27/17 1001  . sodium chloride flush (NS) 0.9 % injection 3 mL  3 mL Intravenous PRN Lady Deutscher, MD      . traMADol Veatrice Bourbon) tablet 50 mg  50 mg Oral Q6H PRN  Lady Deutscher, MD         Discharge Medications: Please see discharge summary for a list of discharge medications.  Relevant Imaging Results:  Relevant Lab Results:   Additional Information SSN: [place], [place]

## 2017-10-28 NOTE — Progress Notes (Signed)
Progress Note    [name] [name]  BSJ:[id number] DOB: [birth year]  DOA: 10/26/2017 PCP: No primary care provider on file.    Brief Narrative:     Medical records reviewed and are as summarized below:  [name] [name] is an 84 y.o. male with medical history significant of arthritis, diabetes mellitus, hypertension, myelodysplastic syndrome, coronary artery disease and prostate cancer who at baseline is very functional and lives in assisted living facility.  He came to the emergency department due to dizziness started this morning when he awoke around 330 or 4 AM.  He went to the bathroom and noted that he was a little off balance.  When he try to get up a couple of hours later to take a shower he felt even more dizzy and fell backwards onto the bed.  There was no associated injury.  He has no headache.  He feels a little nauseated.  He says he was last normal last night around 11:30 PM when he went to bed.  He denies any blurry vision or focal weaknesses.  He is very hard of hearing.  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  He has no chest pain or shortness of breath he has had a cough with some white sputum for about 3 days.  He denies any fever.  He has no unilateral weakness.  He is having trouble urinating for the past 3 days in the emergency department was found to have a 350 mL residual in his bladder.  Having dribbling urine output.  Early catheter was placed due to urinary retention.  Even his dizziness Flomax was not started.  Still has a history of chronic constipation.  CT scan and MRI of the brain were obtained and found no acute infarction.  Was felt that the patient has some benign positional vertigo.  He tried to sit and stand him up so that he could ambulate and they could check orthostatics on 2 occasions and on both occasions the patient was unable to stand due to his dizziness.  He was given IV fluids and Antivert but he cannot go home in this condition.  Be admitted into  the hospital for further evaluation and management.    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  - low-dose ACE inhibitor  Hypothyroidism: - Continue levothyroxine.   Prostate cancer:  -This is remote however patient is having urinary retention. -A Foley catheter had to be placed--- will need referral to urology for voiding trial   Urinary retention  -Foley catheter placed   -unable to start Flomax given his dizziness -outpatient appointment with a urologist.   Osteoarthritis: Noted continue as needed Tylenol.  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 Code Status: DNR Family Communication: none at bedside Disposition Plan: SNF if able-- [facility] patient if not will need maxed out home health and supervision as high fall risk   Medical Consultants:    None.   Subjective:  No further dizziness--- has offered to pain in exchange for a bed here  Objective:    Vitals:   10/28/17 0536 10/28/17 0538 10/28/17 1506 10/28/17 1516  BP: (!) 153/93 126/80 (!) 83/51 132/62  Pulse: 84 (!) 107 84   Resp:   18   Temp:   98.3 F (36.8 C)   TempSrc:   Oral   SpO2: 100% 100% 96%   Weight:      Height:        Intake/Output Summary (Last 24 hours) at 10/28/2017 1639 Last data filed at 10/28/2017 0716 Gross per 24 hour  Intake -  Output 800 ml  Net -800 ml   Filed Weights   10/26/17 1812    Weight: 56.2 kg (124 lb)    Exam: In bed, NAD No rashes of lesion Pleasant and cooperative  Data Reviewed:   I have personally reviewed following labs and imaging studies:  Labs: Labs show the following:   Basic Metabolic Panel: Recent Labs  Lab 10/26/17 0859 10/26/17 0929 10/27/17 0439  NA 144 143 140  K 4.1 4.1 3.8  CL 112* 107 108  CO2 26  --  25  GLUCOSE 137* 132* 96  BUN 15 17 12   CREATININE 1.05 1.00 0.95  CALCIUM 8.8*  --  8.4*   GFR Estimated Creatinine Clearance: 34.5 mL/min (by C-G formula based on SCr of 0.95 mg/dL). Liver Function Tests: Recent Labs  Lab 10/26/17 0859  AST 14*  ALT 10  ALKPHOS 77  BILITOT 0.7  PROT 6.0*  ALBUMIN 3.3*   No results for input(s): LIPASE, AMYLASE in the last 168 hours. No results for input(s): AMMONIA in the last 168 hours. Coagulation profile Recent Labs  Lab 10/26/17 0859  INR 1.12    CBC: Recent Labs  Lab 10/26/17 0859 10/26/17 0929 10/27/17 0439  WBC 3.0*  --  3.0*  NEUTROABS 1.7  --   --   HGB 11.4* 10.9* 11.4*  HCT 34.8* 32.0* 34.1*  MCV 87.7  --  85.9  PLT 91*  --  155   Cardiac Enzymes: No results for input(s): CKTOTAL, CKMB, CKMBINDEX, TROPONINI in the last 168 hours. BNP (last 3 results) No results for input(s): PROBNP in the last 8760 hours. CBG: No results for input(s): GLUCAP in the last 168 hours. D-Dimer: No results for input(s): DDIMER in the last 72 hours. Hgb A1c: No results for input(s): HGBA1C in the last 72 hours. Lipid Profile: No results for input(s): CHOL, HDL, LDLCALC, TRIG, CHOLHDL, LDLDIRECT in the last 72 hours. Thyroid function studies: Recent Labs    10/27/17 1044  TSH 0.585   Anemia work up: No results for input(s): VITAMINB12, FOLATE, FERRITIN, TIBC, IRON, RETICCTPCT in the last 72 hours. Sepsis Labs: Recent Labs  Lab 10/26/17 0859 10/27/17 0439  WBC 3.0* 3.0*    Microbiology No results found for this or any previous visit (from the past 240  hour(s)).  Procedures and diagnostic studies:  No results found.  Medications:   . enoxaparin (LOVENOX) injection  40 mg Subcutaneous Q24H  . levothyroxine  125 mcg Oral QAC breakfast  . sodium chloride flush  3 mL Intravenous Q12H  . sodium chloride flush  3 mL Intravenous Q12H   Continuous Infusions: . sodium chloride       LOS: 0 days   Geradine Girt  [facility]   *Please refer to [email or web address], password [email or web address] to get updated schedule on who will round on this patient, as hospitalists  switch teams weekly. If 7PM-7AM, please contact night-coverage at [email or web address], password [email or web address] for any overnight needs.  10/28/2017, 4:39 PM

## 2017-10-28 NOTE — Care Management Obs Status (Signed)
[facility] NOTIFICATION   Patient Details  Name: [name] [name] MRN: [id number] Date of Birth: 1917-05-06   Medicare Observation Status Notification Given:  Yes    Sharin Mons, RN 10/28/2017, 2:34 PM

## 2017-10-28 NOTE — Progress Notes (Signed)
[facility] has still not responded to referral for placement. CSW and RNCM spoke with patient and daughter via phone. CSW offered private pay placement but patient's daughter stated that they could not afford even two weeks. Patient's daughter inquired about Medicaid, but CSW explained that patient would need to apply, which is a process. CSW encouraged her to apply for patient for long term help, though unsure if patient would qualify depending on how much VA benefits he receives. Patient's daughter states that if we do not hear back from the [place] by tomorrow, she will come pick patient up and take him to her house or have a family member stay with him. She will also contact [facility] to see if they can increase hours with the patient.   Percell Locus Kizzie Cotten LCSW [phone]

## 2017-10-29 DIAGNOSIS — E1141 Type 2 diabetes mellitus with diabetic mononeuropathy: Secondary | ICD-10-CM | POA: Diagnosis not present

## 2017-10-29 DIAGNOSIS — R42 Dizziness and giddiness: Secondary | ICD-10-CM | POA: Diagnosis not present

## 2017-10-29 DIAGNOSIS — I10 Essential (primary) hypertension: Secondary | ICD-10-CM | POA: Diagnosis not present

## 2017-10-29 NOTE — Progress Notes (Signed)
[name] [name] to be D/C'd Home per MD order.  Discussed with the patient and all questions fully answered.  VSS, Skin clean, dry and intact without evidence of skin break down, no evidence of skin tears noted. IV catheter discontinued intact. Site without signs and symptoms of complications. Dressing and pressure applied.  An After Visit Summary was printed and given to the patient. Patient received prescription.  D/c education completed with patient/family including follow up instructions, medication list, d/c activities limitations if indicated, with other d/c instructions as indicated by MD - patient able to verbalize understanding, all questions fully answered.  Pt and family taught how to clean and empty foley. Pt able to empty foley with RN watching.   Patient instructed to return to ED, call 911, or call MD for any changes in condition.   Patient escorted via [facility], and D/C home via private auto.  [name] 10/29/2017 4:57 PM

## 2017-10-29 NOTE — Discharge Summary (Signed)
Physician Discharge Summary  NOLE ROBEY WJX:[id number] DOB: Sep 25, 1917 DOA: 10/26/2017  PCP: No primary care provider on file.-- goes to the [facility] date: 10/26/2017 Discharge date: 10/29/2017  Admitted From: home Discharge disposition: home-- pending admission into a SNF when bed available   Recommendations for Outpatient Follow-Up:   1. Urology referral for voiding trial 2. 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  Diet recommendation:  Regular.  Wound care: None.  Code status: Full.   History of Present Illness:   MACARI ZALESKY is a 84 y.o. male with medical history significant of arthritis, diabetes mellitus, hypertension, myelodysplastic syndrome, coronary artery disease and prostate cancer who at baseline is very functional and lives in assisted living facility.  He came to the emergency department due to dizziness started this morning when he awoke around 330 or 4 AM.  He went to the bathroom and noted that he was a little off balance.  When he try to get up a couple of hours later to take a shower he felt even more dizzy and fell backwards onto the bed.  There was no associated injury.  He has no headache.  He feels a little nauseated.  He says he was last normal last night around 11:30 PM when he went to bed.  He denies any blurry vision or focal weaknesses.  He is very hard of hearing.  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  He has no chest pain or shortness of breath he has had a cough with some white sputum for about 3 days.  He denies any fever.  He has no unilateral weakness.  He is having trouble urinating for the past 3 days in  the emergency department was found to have a 350 mL residual in his bladder.  Having dribbling urine output.  Early catheter was placed due to urinary retention.  Even his dizziness Flomax was not started.  Still has a history of chronic constipation.  CT scan and MRI of the brain were obtained and found no acute infarction.  Was felt that the patient has some benign positional vertigo.  He tried to sit and stand him up so that he could ambulate and they could check orthostatics on 2 occasions and on both occasions the patient was unable to stand due to his dizziness.  He was given IV fluids and Antivert but he cannot go home in this condition.  Be admitted into the hospital for further evaluation and management     Hospital Course by Problem:   Weakness/vertigo:  -resolved on own but patient has been falling a lot at home -seen by PT: SNF-- bed not available, will need home with home health and then SNF when bed available -MRI negative for CVA  Hypothyroidism: - Continue levothyroxine.  Prostate cancer:  -This is remote however patient is having urinary retention (last urology appointment was >50 years). -A Foley catheter had to be placed--- will need referral to urology for voiding trial  Urinary retention  -Foley catheter placed  -unable to start Flomax given his dizziness -outpatient appointment with a urologist for voiding trial.  Osteoarthritis:  -as needed Tylenol.  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:   Myelodysplastic syndrome  -patient with pancytopenia -outpatient follow up      Medical Consultants:      Discharge Exam:   Vitals:   10/29/17 0515 10/29/17 1353  BP: 109/68 126/74  Pulse: 77 94  Resp: 18 20  Temp: 97.8 F (36.6 C) 98.2 F (36.8 C)  SpO2: 98% 98%   Vitals:   10/28/17 1516 10/28/17 2122 10/29/17 0515 10/29/17 1353  BP: 132/62 (!) 143/76 109/68 126/74  Pulse:  75 77 94  Resp:  18 18 20   Temp:  98.9 F  (37.2 C) 97.8 F (36.6 C) 98.2 F (36.8 C)  TempSrc:  Oral Oral Oral  SpO2:  97% 98% 98%  Weight:      Height:        General exam: pleasant and cooperative-- denies dizziness   The results of significant diagnostics from this hospitalization (including imaging, microbiology, ancillary and laboratory) are listed below for reference.     Procedures and Diagnostic Studies:   Dg Chest 2 View  Result Date: 10/26/2017 CLINICAL DATA:  Dizziness, cough EXAM: CHEST - 2 VIEW COMPARISON:  01/03/2017 FINDINGS: The heart size and mediastinal contours are within normal limits. Both lungs are clear. The visualized skeletal structures are unremarkable. Trachea is midline. Degenerative changes of the spine and shoulders. Stable ossified density over the left fourth anterior rib, suspect bone island. Monitor leads overlie the chest. IMPRESSION: No active cardiopulmonary disease. Electronically Signed   By: Jerilynn Mages.  Shick M.D.   On: 10/26/2017 08:27   Ct Head Wo Contrast  Result Date: 10/26/2017 CLINICAL DATA:  Dizziness since 4 a.m. today. EXAM: CT HEAD WITHOUT CONTRAST TECHNIQUE: Contiguous axial images were obtained from the base of the skull through the vertex without intravenous contrast. COMPARISON:  12/11/2016 FINDINGS: Brain: Diffusely enlarged ventricles and subarachnoid spaces. Patchy white matter low density in both cerebral hemispheres. No intracranial hemorrhage, mass lesion or CT evidence of acute infarction. Vascular: No hyperdense vessel or unexpected calcification. Skull: Mildly limited by motion artifacts.  No visible abnormality. Sinuses/Orbits: Mildly limited by motion artifacts. Status post bilateral cataract extraction. Grossly normal paranasal sinuses. Other: None. IMPRESSION: 1. Mildly limited examination due to patient motion with no acute abnormality seen. 2. Stable atrophy and chronic small vessel white matter ischemic changes. Electronically Signed   By: Claudie Revering M.D.   On:  10/26/2017 09:27   Mr Brain Wo Contrast  Result Date: 10/26/2017 CLINICAL DATA:  Altered level of consciousness.  Dizziness. EXAM: MRI HEAD WITHOUT CONTRAST TECHNIQUE: Multiplanar, multiecho pulse sequences of the brain and surrounding structures were obtained without intravenous contrast. COMPARISON:  Head CT 10/26/2017 and MRI 04/10/2015 FINDINGS: Brain: There is no evidence of acute infarct, intracranial hemorrhage, mass, midline shift, or extra-axial fluid collection. Generalized cerebral atrophy is unchanged from the prior MRI and likely within normal limits for age. Periventricular white matter T2 hyperintensities are also unchanged and nonspecific but compatible with minimal chronic small vessel ischemic disease, also not greater than expected for age. Mildly asymmetric ex axial CSF lateral to the left cerebellum is unchanged and without mass effect. Vascular: Major intracranial vascular flow voids are preserved. Skull and upper cervical spine: Unremarkable bone marrow signal. Cervical disc and facet degeneration with herniated disc material and/or posterior longitudinal ligament thickening resulting in spinal stenosis at C3-4 and C4-5 without evidence of cord compression. Sinuses/Orbits: Bilateral cataract extraction. Paranasal sinuses and mastoid air cells are clear. Other: None. IMPRESSION: Largely unremarkable appearance of  the brain for age without acute intracranial abnormality. Electronically Signed   By: Logan Bores M.D.   On: 10/26/2017 14:22     Labs:   Basic Metabolic Panel: Recent Labs  Lab 10/26/17 0859 10/26/17 0929 10/27/17 0439  NA 144 143 140  K 4.1 4.1 3.8  CL 112* 107 108  CO2 26  --  25  GLUCOSE 137* 132* 96  BUN 15 17 12   CREATININE 1.05 1.00 0.95  CALCIUM 8.8*  --  8.4*   GFR Estimated Creatinine Clearance: 34.5 mL/min (by C-G formula based on SCr of 0.95 mg/dL). Liver Function Tests: Recent Labs  Lab 10/26/17 0859  AST 14*  ALT 10  ALKPHOS 77  BILITOT  0.7  PROT 6.0*  ALBUMIN 3.3*   No results for input(s): LIPASE, AMYLASE in the last 168 hours. No results for input(s): AMMONIA in the last 168 hours. Coagulation profile Recent Labs  Lab 10/26/17 0859  INR 1.12    CBC: Recent Labs  Lab 10/26/17 0859 10/26/17 0929 10/27/17 0439  WBC 3.0*  --  3.0*  NEUTROABS 1.7  --   --   HGB 11.4* 10.9* 11.4*  HCT 34.8* 32.0* 34.1*  MCV 87.7  --  85.9  PLT 91*  --  155   Cardiac Enzymes: No results for input(s): CKTOTAL, CKMB, CKMBINDEX, TROPONINI in the last 168 hours. BNP: Invalid input(s): POCBNP CBG: No results for input(s): GLUCAP in the last 168 hours. D-Dimer No results for input(s): DDIMER in the last 72 hours. Hgb A1c No results for input(s): HGBA1C in the last 72 hours. Lipid Profile No results for input(s): CHOL, HDL, LDLCALC, TRIG, CHOLHDL, LDLDIRECT in the last 72 hours. Thyroid function studies Recent Labs    10/27/17 1044  TSH 0.585   Anemia work up No results for input(s): VITAMINB12, FOLATE, FERRITIN, TIBC, IRON, RETICCTPCT in the last 72 hours. Microbiology No results found for this or any previous visit (from the past 240 hour(s)).   Discharge Instructions:   Discharge Instructions    Diet general   Complete by:  As directed    Increase activity slowly   Complete by:  As directed      Allergies as of 10/29/2017      Reactions   Statins Other (See Comments)   Reaction:  Unknown       Medication List    TAKE these medications   levothyroxine 125 MCG tablet Commonly known as:  SYNTHROID, LEVOTHROID Take 1 tablet (125 mcg total) by mouth daily.   STOOL SOFTENER PO Take 2 tablets by mouth daily as needed.      Follow-up Information    [facility] to get [facility] to [facility]rology for voiding trial Follow up.        [facility] Follow up.   Specialty:  Home Health Services Why:  home health services arranged Contact information: [address] [place]  [address] [phone]            Time coordinating discharge: 35 min  Signed:  Geradine Girt  [facility] 10/29/2017, 3:33 PM

## 2017-10-29 NOTE — Progress Notes (Signed)
CSW received call from [facility] Admissions after patient had discharged home. They have a VA bed coming available and may be able to accept the patient from home. CSW updated patient's daughter and provided contact information so she can contact their admissions team in the morning. CSW signing off.  Percell Locus Lajuan Godbee LCSW [phone]

## 2017-10-29 NOTE — Progress Notes (Addendum)
12pm-CSW updated patient's daughter, Tye [place], that patient would need to discharge home while waiting on a VA bed at one of the approved facilities. CSW provided her with the contact numbers for that facility and encouraged her to check in with them every few days to see when a bed becomes available. CSW also encouraged her to make sure Mirna Mires is assisting with the process but she stated that Mirna Mires does not return voicemails. She reported appreciation for the help and will pick up the patient this afternoon. RNCM to set up home health and MD aware. CSW signing off.  10:25am-Patient has been denied to go to the [facility] hospital rehab. Patient does not have long term care benfits but has been given a short term 45-day contract for one of their VA contracted Facilities:   [facility]-no beds available [facility] beds available [facility]- [facility] beds available [facility] beds available [facility]-no beds available currently but may have one opening soon, which patient can admit to from home once available. CSW faxed them patient's referral.   CSW left voicemail for patient's [place] social worker, Mirna Mires [phone] to assist in setting up home health services and to help patient get to one of the approved facilities from home once a bed becomes available.   Percell Locus Ailis Rigaud LCSW [phone]

## 2017-10-29 NOTE — Progress Notes (Signed)
Occupational Therapy Treatment Patient Details Name: [name] [name] MRN: [id number] DOB: December 25, 1917 Today's Date: 10/29/2017    History of present illness 84 y.o. male admitted on 10/26/17 for dizziness/weakness. MRI shows no acute events.  Foley catheter inserted as he was found to be retaining urine.  Pt with significant PMH of prostate CA, NSTEMI, MDS, HTN, DM, and TKA L.   OT comments  Pt making progress with functional goals, is very pleasant and cooperative. OT will continue to follow acutely  Follow Up Recommendations  SNF;Supervision/Assistance - 24 hour    Equipment Recommendations  None recommended by OT    Recommendations for Other Services      Precautions / Restrictions Precautions Precautions: Fall Precaution Comments: pt reports falls at home, "at least 50" per pt Restrictions Weight Bearing Restrictions: No       Mobility Bed Mobility Overal bed mobility: Needs Assistance Bed Mobility: Sit to Supine       Sit to supine: Min guard      Transfers Overall transfer level: Needs assistance Equipment used: Rolling walker (2 wheeled) Transfers: Sit to/from Stand Sit to Stand: Min guard         General transfer comment: Min guard assist to power up to standing.     Balance Overall balance assessment: Needs assistance Sitting-balance support: Feet supported;No upper extremity supported;Bilateral upper extremity supported Sitting balance-Leahy Scale: Fair     Standing balance support: Bilateral upper extremity supported;Single extremity supported Standing balance-Leahy Scale: Fair                             ADL either performed or assessed with clinical judgement   ADL Overall ADL's : Needs assistance/impaired     Grooming: Standing;Wash/dry hands;Wash/dry face;Min guard       Lower Body Bathing: Min guard;Sit to/from stand       Lower Body Dressing: Min guard;Sit to/from stand       Toileting- Water quality scientist and  Hygiene: Min guard;Sit to/from stand   Tub/ Shower Transfer: Min guard;Ambulation;Rolling walker;Grab bars   Functional mobility during ADLs: Min guard;Rolling walker General ADL Comments: Min guard assist for safety and requires cues for safe pace of speed     Vision Patient Visual Report: No change from baseline     Perception     Praxis      Cognition Arousal/Alertness: Awake/alert Behavior During Therapy: WFL for tasks assessed/performed Overall Cognitive Status: History of cognitive impairments - at baseline                                          Exercises     Shoulder Instructions       General Comments      Pertinent Vitals/ Pain       Pain Assessment: No/denies pain  Home Living                                          Prior Functioning/Environment              Frequency  Min 2X/week        Progress Toward Goals  OT Goals(current goals can now be found in the care plan section)  Progress towards OT goals: Progressing toward goals  Plan Discharge plan remains appropriate    Co-evaluation                 AM-PAC PT "6 Clicks" Daily Activity     Outcome Measure   Help from another person eating meals?: None Help from another person taking care of personal grooming?: A Little Help from another person toileting, which includes using toliet, bedpan, or urinal?: A Little Help from another person bathing (including washing, rinsing, drying)?: A Little Help from another person to put on and taking off regular upper body clothing?: A Little Help from another person to put on and taking off regular lower body clothing?: A Little 6 Click Score: 19    End of Session Equipment Utilized During Treatment: Gait belt;Rolling walker  OT Visit Diagnosis: Other abnormalities of gait and mobility (R26.89);Muscle weakness (generalized) (M62.81)   Activity Tolerance Patient tolerated treatment well   Patient  Left in bed;with call bell/phone within reach;with bed alarm set   Nurse Communication      Functional Assessment Tool Used: AM-PAC 6 Clicks Daily Activity   Time: 5189-8421 OT Time Calculation (min): 21 min  Charges: OT G-codes **NOT FOR INPATIENT CLASS** Functional Assessment Tool Used: AM-PAC 6 Clicks Daily Activity OT General Charges $OT Visit: 1 Visit OT Treatments $Self Care/Home Management : 8-22 mins     Emmit Alexanders [facility] 10/29/2017, 12:05 PM

## 2017-11-14 ENCOUNTER — Encounter (INDEPENDENT_AMBULATORY_CARE_PROVIDER_SITE_OTHER): Payer: Medicare Other | Admitting: Ophthalmology

## 2017-11-26 ENCOUNTER — Encounter (INDEPENDENT_AMBULATORY_CARE_PROVIDER_SITE_OTHER): Payer: Medicare Other | Admitting: Ophthalmology

## 2017-11-26 DIAGNOSIS — E113292 Type 2 diabetes mellitus with mild nonproliferative diabetic retinopathy without macular edema, left eye: Secondary | ICD-10-CM

## 2017-11-26 DIAGNOSIS — H35033 Hypertensive retinopathy, bilateral: Secondary | ICD-10-CM | POA: Diagnosis not present

## 2017-11-26 DIAGNOSIS — E113311 Type 2 diabetes mellitus with moderate nonproliferative diabetic retinopathy with macular edema, right eye: Secondary | ICD-10-CM

## 2017-11-26 DIAGNOSIS — E11311 Type 2 diabetes mellitus with unspecified diabetic retinopathy with macular edema: Secondary | ICD-10-CM

## 2017-11-26 DIAGNOSIS — H34811 Central retinal vein occlusion, right eye, with macular edema: Secondary | ICD-10-CM | POA: Diagnosis not present

## 2017-11-26 DIAGNOSIS — I10 Essential (primary) hypertension: Secondary | ICD-10-CM | POA: Diagnosis not present

## 2017-11-26 DIAGNOSIS — H43813 Vitreous degeneration, bilateral: Secondary | ICD-10-CM

## 2018-02-04 ENCOUNTER — Encounter (INDEPENDENT_AMBULATORY_CARE_PROVIDER_SITE_OTHER): Payer: Medicare Other | Admitting: Ophthalmology

## 2018-05-09 DEATH — deceased
